# Patient Record
Sex: Female | Born: 1940 | Race: White | Hispanic: No | Smoking: Never smoker
Health system: Southern US, Community
[De-identification: ages and names within clinical notes are randomized; demographics above are authoritative.]

## PROBLEM LIST (undated history)

## (undated) DIAGNOSIS — E039 Hypothyroidism, unspecified: Secondary | ICD-10-CM

## (undated) DIAGNOSIS — G43009 Migraine without aura, not intractable, without status migrainosus: Secondary | ICD-10-CM

## (undated) DIAGNOSIS — H409 Unspecified glaucoma: Secondary | ICD-10-CM

## (undated) DIAGNOSIS — M1990 Unspecified osteoarthritis, unspecified site: Secondary | ICD-10-CM

## (undated) DIAGNOSIS — E785 Hyperlipidemia, unspecified: Secondary | ICD-10-CM

## (undated) DIAGNOSIS — T7840XA Allergy, unspecified, initial encounter: Secondary | ICD-10-CM

## (undated) DIAGNOSIS — K219 Gastro-esophageal reflux disease without esophagitis: Secondary | ICD-10-CM

## (undated) DIAGNOSIS — R42 Dizziness and giddiness: Secondary | ICD-10-CM

## (undated) DIAGNOSIS — C50919 Malignant neoplasm of unspecified site of unspecified female breast: Secondary | ICD-10-CM

## (undated) DIAGNOSIS — Z9889 Other specified postprocedural states: Secondary | ICD-10-CM

## (undated) DIAGNOSIS — N183 Chronic kidney disease, stage 3 unspecified: Secondary | ICD-10-CM

## (undated) DIAGNOSIS — N2581 Secondary hyperparathyroidism of renal origin: Secondary | ICD-10-CM

## (undated) DIAGNOSIS — M8580 Other specified disorders of bone density and structure, unspecified site: Secondary | ICD-10-CM

## (undated) DIAGNOSIS — R7309 Other abnormal glucose: Secondary | ICD-10-CM

## (undated) DIAGNOSIS — K5792 Diverticulitis of intestine, part unspecified, without perforation or abscess without bleeding: Secondary | ICD-10-CM

## (undated) DIAGNOSIS — K760 Fatty (change of) liver, not elsewhere classified: Secondary | ICD-10-CM

## (undated) DIAGNOSIS — K5732 Diverticulitis of large intestine without perforation or abscess without bleeding: Secondary | ICD-10-CM

## (undated) DIAGNOSIS — E8940 Asymptomatic postprocedural ovarian failure: Secondary | ICD-10-CM

## (undated) DIAGNOSIS — E7800 Pure hypercholesterolemia, unspecified: Secondary | ICD-10-CM

## (undated) DIAGNOSIS — J309 Allergic rhinitis, unspecified: Secondary | ICD-10-CM

## (undated) DIAGNOSIS — C801 Malignant (primary) neoplasm, unspecified: Secondary | ICD-10-CM

## (undated) DIAGNOSIS — R002 Palpitations: Secondary | ICD-10-CM

## (undated) DIAGNOSIS — O26611 Liver and biliary tract disorders in pregnancy, first trimester: Secondary | ICD-10-CM

## (undated) DIAGNOSIS — I10 Essential (primary) hypertension: Secondary | ICD-10-CM

## (undated) DIAGNOSIS — R112 Nausea with vomiting, unspecified: Secondary | ICD-10-CM

## (undated) DIAGNOSIS — N181 Chronic kidney disease, stage 1: Secondary | ICD-10-CM

## (undated) HISTORY — DX: Unspecified osteoarthritis, unspecified site: M19.90

## (undated) HISTORY — DX: Unspecified glaucoma: H40.9

## (undated) HISTORY — PX: CHOLECYSTECTOMY: SHX55

## (undated) HISTORY — DX: Pure hypercholesterolemia, unspecified: E78.00

## (undated) HISTORY — PX: TONSILLECTOMY: SUR1361

## (undated) HISTORY — PX: COLONOSCOPY: SHX174

## (undated) HISTORY — DX: Chronic kidney disease, stage 1: N18.1

## (undated) HISTORY — DX: Gastro-esophageal reflux disease without esophagitis: K21.9

## (undated) HISTORY — DX: Allergic rhinitis, unspecified: J30.9

## (undated) HISTORY — DX: Asymptomatic postprocedural ovarian failure: E89.40

## (undated) HISTORY — DX: Hyperlipidemia, unspecified: E78.5

## (undated) HISTORY — PX: BASAL CELL CARCINOMA EXCISION: SHX1214

## (undated) HISTORY — DX: Migraine without aura, not intractable, without status migrainosus: G43.009

## (undated) HISTORY — DX: Other specified disorders of bone density and structure, unspecified site: M85.80

## (undated) HISTORY — DX: Chronic kidney disease, stage 3 unspecified: N18.30

## (undated) HISTORY — DX: Malignant (primary) neoplasm, unspecified: C80.1

## (undated) HISTORY — DX: Hypothyroidism, unspecified: E03.9

## (undated) HISTORY — DX: Diverticulitis of large intestine without perforation or abscess without bleeding: K57.32

## (undated) HISTORY — PX: APPENDECTOMY: SHX54

## (undated) HISTORY — DX: Other abnormal glucose: R73.09

## (undated) HISTORY — DX: Allergy, unspecified, initial encounter: T78.40XA

## (undated) HISTORY — DX: Chronic kidney disease, stage 3 (moderate): N18.3

## (undated) HISTORY — PX: VAGINAL HYSTERECTOMY: SUR661

---

## 1999-11-17 ENCOUNTER — Other Ambulatory Visit: Admission: RE | Admit: 1999-11-17 | Discharge: 1999-11-17 | Payer: Self-pay | Admitting: Obstetrics and Gynecology

## 2004-01-11 DIAGNOSIS — C801 Malignant (primary) neoplasm, unspecified: Secondary | ICD-10-CM

## 2004-01-11 HISTORY — DX: Malignant (primary) neoplasm, unspecified: C80.1

## 2005-10-12 ENCOUNTER — Encounter: Admission: RE | Admit: 2005-10-12 | Discharge: 2005-10-12 | Payer: Self-pay | Admitting: Gastroenterology

## 2006-08-02 DIAGNOSIS — E7800 Pure hypercholesterolemia, unspecified: Secondary | ICD-10-CM | POA: Insufficient documentation

## 2006-08-02 DIAGNOSIS — G43009 Migraine without aura, not intractable, without status migrainosus: Secondary | ICD-10-CM | POA: Insufficient documentation

## 2006-08-02 DIAGNOSIS — E8940 Asymptomatic postprocedural ovarian failure: Secondary | ICD-10-CM | POA: Insufficient documentation

## 2006-12-04 DIAGNOSIS — E039 Hypothyroidism, unspecified: Secondary | ICD-10-CM | POA: Insufficient documentation

## 2008-04-29 LAB — HM COLONOSCOPY

## 2008-05-14 DIAGNOSIS — Z8719 Personal history of other diseases of the digestive system: Secondary | ICD-10-CM | POA: Insufficient documentation

## 2008-09-04 ENCOUNTER — Ambulatory Visit: Payer: Self-pay | Admitting: Unknown Physician Specialty

## 2009-02-16 ENCOUNTER — Ambulatory Visit: Payer: Self-pay | Admitting: Family Medicine

## 2009-08-24 DIAGNOSIS — N183 Chronic kidney disease, stage 3 unspecified: Secondary | ICD-10-CM | POA: Insufficient documentation

## 2009-08-24 DIAGNOSIS — R7309 Other abnormal glucose: Secondary | ICD-10-CM | POA: Insufficient documentation

## 2012-01-11 HISTORY — PX: BASAL CELL CARCINOMA EXCISION: SHX1214

## 2012-02-13 ENCOUNTER — Ambulatory Visit: Payer: Self-pay | Admitting: Family Medicine

## 2012-02-20 ENCOUNTER — Ambulatory Visit: Payer: Self-pay | Admitting: Family Medicine

## 2012-03-01 ENCOUNTER — Encounter: Payer: Self-pay | Admitting: Cardiovascular Disease

## 2012-03-01 ENCOUNTER — Ambulatory Visit (INDEPENDENT_AMBULATORY_CARE_PROVIDER_SITE_OTHER): Payer: Medicare Other | Admitting: Cardiovascular Disease

## 2012-03-01 VITALS — BP 136/80 | HR 81 | Ht 65.0 in | Wt 153.2 lb

## 2012-03-01 DIAGNOSIS — R112 Nausea with vomiting, unspecified: Secondary | ICD-10-CM

## 2012-03-01 DIAGNOSIS — R000 Tachycardia, unspecified: Secondary | ICD-10-CM | POA: Insufficient documentation

## 2012-03-01 DIAGNOSIS — R0602 Shortness of breath: Secondary | ICD-10-CM | POA: Insufficient documentation

## 2012-03-01 NOTE — Patient Instructions (Addendum)
You are doing well. No medication changes were made.  Please call us if you have new issues that need to be addressed before your next appt.    

## 2012-03-01 NOTE — Assessment & Plan Note (Signed)
She reports that her breathing has improved since her episodes of illness. She is otherwise active. She does not want further testing at this time.

## 2012-03-01 NOTE — Assessment & Plan Note (Signed)
Acute onset of gastroenteritis 10 days ago. She has been weak since then, slowly improving.

## 2012-03-01 NOTE — Progress Notes (Signed)
Patient ID: Theresa Hall, female    DOB: [birth year], 72 y.o.   MRN: [id number]  HPI Comments: Theresa Hall is a very pleasant 72 year old woman, patient of Dr. Charlette Caffey, no significant prior cardiac history who presents for evaluation of recent tachycardia episodes..  She reports that approximately 10 days ago, she had severe gastroenteritis. She had 8-9 episodes of vomiting and diarrhea lasting from 4 in the morning until 9 in the morning. She felt very weak and dehydrated following her illness. This occurred on a Tuesday. She's not eating much for the rest of the week, only small bites and little bits of fluid. That Sunday, she went to church and had acute onset of palpitations and severe tachycardia lasting for several minutes. Her legs felt like "rubber bands" and she had difficulty getting home, only with the assistance of a friend. Friend reported that she was pale and diaphoretic. Blood pressure at home was very high. Tachycardia resolved on its own and she resumed her fluid intake and trying to eat better. She saw Dr. Charlette Caffey the next day and lab work was obtained.  That showed BNP 18, creatinine 1.14, potassium 4.4, normal LFTs, sodium 143, CK 129, d-dimer 0.2 EKG was essentially normal with heart rate of 70  She has felt weak since then but slowly improving EKG today shows normal sinus rhythm with rate 70 beats per minute with no significant ST or T wave changes   Outpatient Encounter Prescriptions as of 03/01/2012  Medication Sig Dispense Refill  . aspirin 81 MG tablet Take 162 mg by mouth daily.       . Calcium Carbonate-Vitamin D (CALCIUM + D PO) Take by mouth 2 (two) times daily.      . Calcium Polycarbophil (FIBERCON PO) Take by mouth daily.      . ergocalciferol (VITAMIN D2) 50000 UNITS capsule Take 50,000 Units by mouth once a week.      . estradiol (VIVELLE-DOT) 0.0375 MG/24HR Place 1 patch onto the skin 2 (two) times a week.      . fluticasone (FLONASE) 50 MCG/ACT nasal  spray Place 2 sprays into the nose daily.       Marland Kitchen levothyroxine (SYNTHROID, LEVOTHROID) 88 MCG tablet Take 88 mcg by mouth daily.      . Multiple Vitamin (MULTIVITAMIN) tablet Take 1 tablet by mouth daily.      . Omega-3 Fatty Acids (FISH OIL) 1000 MG CAPS Take 2,000 mg by mouth daily.      Marland Kitchen omeprazole (PRILOSEC) 40 MG capsule Take 40 mg by mouth daily.      . pravastatin (PRAVACHOL) 80 MG tablet Take 80 mg by mouth daily.      . SUMAtriptan (IMITREX STATDOSE REFILL) 6 MG/0.5ML SOLN injection Inject 6 mg into the skin once as needed for migraine or headache. F      . topiramate (TOPAMAX) 50 MG tablet Take 50 mg by mouth daily.       . travoprost, benzalkonium, (TRAVATAN) 0.004 % ophthalmic solution 1 drop at bedtime.        Review of Systems  Constitutional: Negative.   HENT: Negative.   Eyes: Negative.   Respiratory: Negative.   Cardiovascular: Positive for palpitations.       Tachycardia  Gastrointestinal: Negative.   Musculoskeletal: Negative.   Skin: Negative.   Neurological: Negative.   Psychiatric/Behavioral: Negative.   All other systems reviewed and are negative.    BP 136/80  Pulse 81  Ht 5\' 5"  (1.651 m)  Wt 153 lb 4 oz (69.514 kg)  BMI 25.5 kg/m2  Physical Exam  Nursing note and vitals reviewed. Constitutional: She is oriented to person, place, and time. She appears well-developed and well-nourished.  HENT:  Head: Normocephalic.  Nose: Nose normal.  Mouth/Throat: Oropharynx is clear and moist.  Eyes: Conjunctivae are normal. Pupils are equal, round, and reactive to light.  Neck: Normal range of motion. Neck supple. No JVD present.  Cardiovascular: Normal rate, regular rhythm, S1 normal, S2 normal, normal heart sounds and intact distal pulses.  Exam reveals no gallop and no friction rub.   No murmur heard. Pulmonary/Chest: Effort normal and breath sounds normal. No respiratory distress. She has no wheezes. She has no rales. She exhibits no tenderness.   Abdominal: Soft. Bowel sounds are normal. She exhibits no distension. There is no tenderness.  Musculoskeletal: Normal range of motion. She exhibits no edema and no tenderness.  Lymphadenopathy:    She has no cervical adenopathy.  Neurological: She is alert and oriented to person, place, and time. Coordination normal.  Skin: Skin is warm and dry. No rash noted. No erythema.  Psychiatric: She has a normal mood and affect. Her behavior is normal. Judgment and thought content normal.    Assessment and Plan

## 2012-03-01 NOTE — Assessment & Plan Note (Signed)
Type of arrhythmia while she was at church is uncertain, unable to exclude atrial fibrillation or SVT, even atrial tachycardia. Resolved on its own. My suspicion is it could be connected to her recent gastroenteritis episode. No further episodes since that time and no prior episodes. Otherwise doing well today. We have suggested he closely monitor her and if she has additional episodes, further testing could be done such as Holter monitors, poor event monitors, echocardiogram. Clinical exam is otherwise essentially benign, EKG last week and today is essentially normal.

## 2012-05-09 ENCOUNTER — Ambulatory Visit: Payer: Self-pay | Admitting: Nurse Practitioner

## 2012-05-24 ENCOUNTER — Encounter: Payer: Self-pay | Admitting: *Deleted

## 2012-05-28 ENCOUNTER — Ambulatory Visit (INDEPENDENT_AMBULATORY_CARE_PROVIDER_SITE_OTHER): Payer: Medicare Other | Admitting: Nurse Practitioner

## 2012-05-28 ENCOUNTER — Encounter: Payer: Self-pay | Admitting: Nurse Practitioner

## 2012-05-28 VITALS — BP 140/72 | HR 78 | Ht 64.5 in | Wt 164.0 lb

## 2012-05-28 DIAGNOSIS — Z01419 Encounter for gynecological examination (general) (routine) without abnormal findings: Secondary | ICD-10-CM

## 2012-05-28 MED ORDER — ESTRADIOL 0.0375 MG/24HR TD PTTW: 1.0000 | MEDICATED_PATCH | TRANSDERMAL | Status: DC

## 2012-05-28 NOTE — Patient Instructions (Signed)

## 2012-05-28 NOTE — Progress Notes (Signed)
72 y.o. G2, P2. widowed Caucasian Fe here for annual exam.  Recent cardiology visit in February for palpitation. Cardiologist found no problems and related this to a recent viral illness with electrolyte imbalance.  Over the past several months there has been a slight elevation in serum creatine. She is now off all NSAID. Now some flare of generalized joint pain but tolerable.  She continues on Vivelle dot but realizes there may some insurance issues with coverage.  Patient's last menstrual period was 01/11/1987.          Sexually active: no  The current method of family planning is status post hysterectomy.    Exercising: no  The patient does not participate in regular exercise at present. Smoker:  no  Health Maintenance: Pap:  12/01/2000  normal MMG:  05/2012 normal Colonoscopy:  08/2008 normal BMD:   02/2012 T Score: spine 0.5; left femur neck -1.8    (PCP manages) TDaP:  Up to date, per pt.   Labs: PCP does lab (blood) work.    reports that she has never smoked. She has never used smokeless tobacco. She reports that she does not drink alcohol or use illicit drugs.  Past Medical History  Diagnosis Date  . Other and unspecified hyperlipidemia   . Unspecified glaucoma(365.9)   . Osteoarthritis   . Postablative ovarian failure   . Pure hypercholesterolemia   . Migraine without aura, without mention of intractable migraine without mention of status migrainosus   . Unspecified hypothyroidism   . Allergic rhinitis, cause unspecified   . Diverticulitis of colon (without mention of hemorrhage)   . Chronic kidney disease, stage I   . Other abnormal glucose   . Chronic kidney disease, stage III (moderate)   . Cancer 10/2011    basal 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    Past Surgical History  Procedure Laterality Date  . Basal cell carcinoma excision    . Colonoscopy    . Tonsillectomy    . Appendectomy    . Vaginal hysterectomy    . Basal cell carcinoma excision  2014    nasal    Current  Outpatient Prescriptions  Medication Sig Dispense Refill  . aspirin 81 MG tablet Take 162 mg by mouth daily.       . Calcium Carbonate-Vitamin D (CALCIUM + D PO) Take by mouth 2 (two) times daily.      . Calcium Polycarbophil (FIBERCON PO) Take by mouth daily.      . ergocalciferol (VITAMIN D2) 50000 UNITS capsule Take 50,000 Units by mouth once a week.      . estradiol (VIVELLE-DOT) 0.0375 MG/24HR Place 1 patch onto the skin 2 (two) times a week.  24 patch  3  . fluticasone (FLONASE) 50 MCG/ACT nasal spray Place 2 sprays into the nose daily.       Marland Kitchen ipratropium (ATROVENT) 0.02 % nebulizer solution Take 500 mcg by nebulization 4 (four) times daily.      Marland Kitchen levothyroxine (SYNTHROID, LEVOTHROID) 88 MCG tablet Take 88 mcg by mouth daily.      . Multiple Vitamin (MULTIVITAMIN) tablet Take 1 tablet by mouth daily.      . Omega-3 Fatty Acids (FISH OIL) 1000 MG CAPS Take 2,000 mg by mouth daily.      Marland Kitchen omeprazole (PRILOSEC) 40 MG capsule Take 40 mg by mouth daily.      . pravastatin (PRAVACHOL) 80 MG tablet Take 80 mg by mouth daily.      . SUMAtriptan (IMITREX STATDOSE  REFILL) 6 MG/0.5ML SOLN injection Inject 6 mg into the skin once as needed for migraine or headache. F      . topiramate (TOPAMAX) 50 MG tablet Take 50 mg by mouth daily.       . travoprost, benzalkonium, (TRAVATAN) 0.004 % ophthalmic solution 1 drop at bedtime.      . mometasone (NASONEX) 50 MCG/ACT nasal spray Place 2 sprays into the nose daily.       No current facility-administered medications for this visit.    Family History  Problem Relation Age of Onset  . Heart attack Father     x2  . Heart disease Sister   . Heart attack Sister   . Heart attack Brother   . Heart failure Brother     ROS:  Pertinent items are noted in HPI.  Otherwise, a comprehensive ROS was negative.  Exam:   BP 140/72  Pulse 78  Ht 5' 4.5" (1.638 m)  Wt 164 lb (74.39 kg)  BMI 27.73 kg/m2  LMP 01/11/1987 Height: 5' 4.5" (163.8 cm)  Ht  Readings from Last 3 Encounters:  05/28/12 5' 4.5" (1.638 m)  03/01/12 5\' 5"  (1.651 m)    General appearance: alert, cooperative and appears stated age Head: Normocephalic, without obvious abnormality, atraumatic Neck: no adenopathy, supple, symmetrical, trachea midline and thyroid normal to inspection and palpation Lungs: clear to auscultation bilaterally Breasts: normal appearance, no masses or tenderness Heart: regular rate and rhythm Abdomen: soft, non-tender; no masses,  no organomegaly Extremities: extremities normal, atraumatic, no cyanosis or edema Skin: Skin color, texture, turgor normal. No rashes or lesions Lymph nodes: Cervical, supraclavicular, and axillary nodes normal. No abnormal inguinal nodes palpated Neurologic: Grossly normal   Pelvic: External genitalia:  no lesions              Urethra:  normal appearing urethra with no masses, tenderness or lesions              Bartholin's and Skene's: normal                 Vagina: normal appearing vagina with normal color and discharge, no lesions              Cervix: absent              Pap taken: no Bimanual Exam:  Uterus:  uterus absent              Adnexa: no mass, fullness, tenderness               Rectovaginal: Confirms               Anus:  normal sphincter tone, no lesions  A:  Well Woman with normal exam  S/P TAH / BSO secondary to uterine fibroids 10/1987 on ERT    P:   Pap smear as per guidelines   Mammogram due 5/15  Refill Vivelle dot 0.375 mg (she has been reluctant to go lower in dosage in the past, maybe can reduce  dosage to get insurance coverage.  Discussed the positive and adverse effects of ERT including DVT, CVA, caner, etc. Pt wishes to continue  return annually or prn  An After Visit Summary was printed and given to the patient.

## 2012-05-29 ENCOUNTER — Telehealth: Payer: Self-pay | Admitting: Nurse Practitioner

## 2012-05-29 NOTE — Telephone Encounter (Signed)
Pt wants to talk with PG concerning the prescription she sent to [facility]

## 2012-05-29 NOTE — Telephone Encounter (Signed)
Spoke with patient re: vivelle dot. Pharmacy had already called her when she got home yesterday to let her know that her insurance will no longer be covering this. She states that her and PG discussed this and had other options. Is ok with whatever PG wants. Did say not able to pay $70 monthly if we do the prior auth. Please advise of other options.

## 2012-05-29 NOTE — Telephone Encounter (Signed)
Lets send in order for Vivelle dot 0.025 mg and see if less expensive for her and maybe a generic option.  Still may need for [place] to send prior authorization.

## 2012-05-30 NOTE — Progress Notes (Signed)
Encounter reviewed by Dr. Brook Silva.  

## 2013-04-24 ENCOUNTER — Ambulatory Visit: Payer: Self-pay | Admitting: Family Medicine

## 2013-11-11 ENCOUNTER — Encounter: Payer: Self-pay | Admitting: Nurse Practitioner

## 2013-12-23 LAB — LIPID PANEL
Cholesterol: 179 mg/dL (ref 0–200)
HDL: 82 mg/dL — AB (ref 35–70)
LDL Cholesterol: 79 mg/dL
TRIGLYCERIDES: 89 mg/dL (ref 40–160)

## 2014-06-23 ENCOUNTER — Encounter: Payer: Self-pay | Admitting: Family Medicine

## 2014-06-23 ENCOUNTER — Other Ambulatory Visit: Payer: Self-pay | Admitting: Emergency Medicine

## 2014-06-23 ENCOUNTER — Encounter (INDEPENDENT_AMBULATORY_CARE_PROVIDER_SITE_OTHER): Payer: Self-pay

## 2014-06-23 ENCOUNTER — Ambulatory Visit (INDEPENDENT_AMBULATORY_CARE_PROVIDER_SITE_OTHER): Payer: Commercial Managed Care - HMO | Admitting: Family Medicine

## 2014-06-23 ENCOUNTER — Telehealth: Payer: Self-pay | Admitting: Family Medicine

## 2014-06-23 VITALS — BP 122/76 | HR 91 | Temp 98.5°F | Resp 16 | Ht 65.0 in | Wt 154.3 lb

## 2014-06-23 DIAGNOSIS — N183 Chronic kidney disease, stage 3 unspecified: Secondary | ICD-10-CM

## 2014-06-23 DIAGNOSIS — E7800 Pure hypercholesterolemia, unspecified: Secondary | ICD-10-CM

## 2014-06-23 DIAGNOSIS — R7309 Other abnormal glucose: Secondary | ICD-10-CM

## 2014-06-23 DIAGNOSIS — E785 Hyperlipidemia, unspecified: Secondary | ICD-10-CM | POA: Insufficient documentation

## 2014-06-23 DIAGNOSIS — E039 Hypothyroidism, unspecified: Secondary | ICD-10-CM | POA: Diagnosis not present

## 2014-06-23 DIAGNOSIS — G43001 Migraine without aura, not intractable, with status migrainosus: Secondary | ICD-10-CM

## 2014-06-23 DIAGNOSIS — N189 Chronic kidney disease, unspecified: Secondary | ICD-10-CM | POA: Insufficient documentation

## 2014-06-23 DIAGNOSIS — R002 Palpitations: Secondary | ICD-10-CM | POA: Insufficient documentation

## 2014-06-23 DIAGNOSIS — K2100 Gastro-esophageal reflux disease with esophagitis, without bleeding: Secondary | ICD-10-CM | POA: Insufficient documentation

## 2014-06-23 MED ORDER — ONETOUCH ULTRA BLUE VI STRP: ORAL_STRIP | Status: DC

## 2014-06-23 MED ORDER — GLUCOSE BLOOD VI STRP: ORAL_STRIP | Status: DC

## 2014-06-23 NOTE — Telephone Encounter (Signed)
The pharmacy received the order for the test strips but it had no name on it. Frady is requesting that One Touch Ultra Test Strips be called in to [facility]. Thank you

## 2014-06-23 NOTE — Addendum Note (Signed)
Addended by: Ashok Norris on: 06/23/2014 10:31 AM   Modules accepted: Miquel Dunn

## 2014-06-23 NOTE — Patient Instructions (Signed)
Fu 6 mo

## 2014-06-23 NOTE — Progress Notes (Signed)
Name: Theresa Hall   MRN: [id number]    DOB: [birth year]   Date:06/23/2014       Progress Note  Subjective  Chief Complaint  Chief Complaint  Patient presents with  . Hypothyroidism  . Hyperlipidemia    Hyperlipidemia This is a chronic problem. The current episode started more than 1 year ago. The problem is controlled. Recent lipid tests were reviewed and are normal. There are no known factors aggravating her hyperlipidemia. Pertinent negatives include no chest pain, focal weakness, myalgias or shortness of breath. The current treatment provides moderate improvement of lipids. There are no compliance problems.  Risk factors for coronary artery disease include dyslipidemia and a sedentary lifestyle.  Thyroid Problem Presents for follow-up visit. Symptoms include anxiety. Patient reports no constipation, diarrhea, palpitations, tremors or weight loss. The symptoms have been stable. Past treatments include levothyroxine. The treatment provided moderate relief. Her past medical history is significant for hyperlipidemia. There is no history of atrial fibrillation or Graves' ophthalmopathy. There are no known risk factors.  Migraine  This is a chronic problem. The current episode started more than 1 year ago. The problem occurs intermittently. The problem has been resolved. The pain quality is similar to prior headaches. The quality of the pain is described as throbbing. Pertinent negatives include no back pain, blurred vision, coughing, dizziness, eye redness, fever, hearing loss, insomnia, nausea, neck pain, seizures, sore throat, tingling, tinnitus, vomiting, weakness or weight loss.   CKD  Followed by nephrologist and is stable  STABLE AND IS FOLLOWED BY NEPHROLOGIST     Past Medical History  Diagnosis Date  . Other and unspecified hyperlipidemia   . Unspecified glaucoma   . Osteoarthritis   . Postablative ovarian failure   . Pure hypercholesterolemia   . Migraine without aura,  without mention of intractable migraine without mention of status migrainosus   . Unspecified hypothyroidism   . Allergic rhinitis, cause unspecified   . Diverticulitis of colon (without mention of hemorrhage)   . Chronic kidney disease, stage I   . Other abnormal glucose   . Chronic kidney disease, stage III (moderate)   . Cancer 10/2011    basal 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    History  Substance Use Topics  . Smoking status: Never Smoker   . Smokeless tobacco: Never Used  . Alcohol Use: No     Current outpatient prescriptions:  .  Calcium Carbonate-Vitamin D (CALCIUM + D PO), Take by mouth 2 (two) times daily., Disp: , Rfl:  .  Calcium Polycarbophil (FIBERCON PO), Take by mouth daily., Disp: , Rfl:  .  Fish Oil-Cholecalciferol (FISH OIL + D3) 1200-1000 MG-UNIT CAPS, Take by mouth., Disp: , Rfl:  .  latanoprost (XALATAN) 0.005 % ophthalmic solution, PLACE 1 DROP INTO BOTH EYES AT BEDTIME, Disp: , Rfl: 5 .  levothyroxine (SYNTHROID, LEVOTHROID) 88 MCG tablet, Take 88 mcg by mouth daily before breakfast., Disp: , Rfl:  .  mometasone (NASONEX) 50 MCG/ACT nasal spray, Place into the nose., Disp: , Rfl:  .  Multiple Vitamin (MULTIVITAMIN) tablet, Take 1 tablet by mouth daily., Disp: , Rfl:  .  Omega-3 Fatty Acids (FISH OIL) 1000 MG CAPS, Take 2,000 mg by mouth daily., Disp: , Rfl:  .  omeprazole (PRILOSEC) 40 MG capsule, Take 40 mg by mouth daily., Disp: , Rfl:  .  pravastatin (PRAVACHOL) 80 MG tablet, Take 80 mg by mouth daily., Disp: , Rfl:  .  topiramate (TOPAMAX) 50 MG tablet, Take 50 mg by  mouth daily. , Disp: , Rfl:  .  Travoprost, BAK Free, (TRAVATAN Z) 0.004 % SOLN ophthalmic solution, Apply to eye., Disp: , Rfl:   Allergies  Allergen Reactions  . Codeine Other (See Comments)    Patient had insomnia and nausea    Review of Systems  Constitutional: Negative for fever, chills and weight loss.  HENT: Negative for congestion, hearing loss, sore throat and tinnitus.   Eyes:  Negative for blurred vision, double vision and redness.  Respiratory: Negative for cough, hemoptysis and shortness of breath.   Cardiovascular: Negative for chest pain, palpitations, orthopnea, claudication and leg swelling.  Gastrointestinal: Negative for heartburn, nausea, vomiting, diarrhea, constipation and blood in stool.  Genitourinary: Negative for dysuria, urgency, frequency and hematuria.  Musculoskeletal: Negative for myalgias, back pain, joint pain, falls and neck pain.  Skin: Negative for itching.  Neurological: Negative for dizziness, tingling, tremors, focal weakness, seizures, loss of consciousness, weakness and headaches.  Endo/Heme/Allergies: Does not bruise/bleed easily.       Hypotx  Psychiatric/Behavioral: Negative for depression and substance abuse. The patient is nervous/anxious. The patient does not have insomnia.      Objective  Filed Vitals:   06/23/14 0934  BP: 122/76  Pulse: 91  Temp: 98.5 F (36.9 C)  TempSrc: Oral  Resp: 16  Height: 5' 5"  (1.651 m)  Weight: 154 lb 4.8 oz (69.99 kg)  SpO2: 97%     Physical Exam  Constitutional: She is oriented to person, place, and time and well-developed, well-nourished, and in no distress.  HENT:  Head: Normocephalic.  Eyes: EOM are normal. Pupils are equal, round, and reactive to light.  Neck: Normal range of motion. No thyromegaly present.  Cardiovascular: Normal rate, regular rhythm and normal heart sounds.   No murmur heard. Pulmonary/Chest: Effort normal and breath sounds normal.  Abdominal: Soft. Bowel sounds are normal.  Musculoskeletal: Normal range of motion. She exhibits no edema.  Neurological: She is alert and oriented to person, place, and time. No cranial nerve deficit. Gait normal.  Skin: Skin is warm and dry. No rash noted.  Psychiatric: Memory and affect normal.      Assessment & Plan  1. Migraine without aura and with status migrainosus, not intractable stable   2.  Hypercholesterolemia without hypertriglyceridemia stable  - Lipid Profile  3. Chronic kidney disease, stage 3 (moderate stable - Comp Met (CMET)  4. Hypothyroidism, unspecified hypothyroidism type stable - TSH

## 2014-06-28 ENCOUNTER — Other Ambulatory Visit: Payer: Self-pay | Admitting: Family Medicine

## 2014-07-02 NOTE — Telephone Encounter (Signed)
Do you know if this has been taken care of?

## 2014-07-05 LAB — COMPREHENSIVE METABOLIC PANEL
ALT: 25 IU/L (ref 0–32)
AST: 23 IU/L (ref 0–40)
Albumin/Globulin Ratio: 1.9 (ref 1.1–2.5)
Albumin: 4.6 g/dL (ref 3.5–4.8)
Alkaline Phosphatase: 109 IU/L (ref 39–117)
BILIRUBIN TOTAL: 0.4 mg/dL (ref 0.0–1.2)
BUN / CREAT RATIO: 18 (ref 11–26)
BUN: 20 mg/dL (ref 8–27)
CALCIUM: 10.2 mg/dL (ref 8.7–10.3)
CO2: 23 mmol/L (ref 18–29)
Chloride: 104 mmol/L (ref 97–108)
Creatinine, Ser: 1.1 mg/dL — ABNORMAL HIGH (ref 0.57–1.00)
GFR calc Af Amer: 57 mL/min/{1.73_m2} — ABNORMAL LOW (ref >59)
GFR calc non Af Amer: 49 mL/min/{1.73_m2} — ABNORMAL LOW (ref >59)
GLOBULIN, TOTAL: 2.4 g/dL (ref 1.5–4.5)
Glucose: 99 mg/dL (ref 65–99)
Potassium: 4.6 mmol/L (ref 3.5–5.2)
SODIUM: 142 mmol/L (ref 134–144)
TOTAL PROTEIN: 7 g/dL (ref 6.0–8.5)

## 2014-07-05 LAB — TSH: TSH: 0.299 u[IU]/mL — ABNORMAL LOW (ref 0.450–4.500)

## 2014-07-05 LAB — LIPID PANEL
Chol/HDL Ratio: 2.4 ratio (ref 0.0–4.4)
Cholesterol, Total: 154 mg/dL (ref 100–199)
HDL: 63 mg/dL
LDL Calculated: 69 mg/dL (ref 0–99)
Triglycerides: 109 mg/dL (ref 0–149)
VLDL Cholesterol Cal: 22 mg/dL (ref 5–40)

## 2014-07-10 NOTE — Progress Notes (Signed)
Patient notified

## 2014-09-16 ENCOUNTER — Other Ambulatory Visit: Payer: Self-pay | Admitting: Family Medicine

## 2014-10-06 ENCOUNTER — Telehealth: Payer: Self-pay

## 2014-10-06 NOTE — Telephone Encounter (Signed)
Patient called because she needs a [employer] referral for today for [facility] to be seen by Dr. Wallace Going.  Auth # [id number] Start- 10/06/2014 Expires-  04/04/2015 Dr. Leandrew Koyanagi  ICD-10: Z01.00

## 2014-11-13 ENCOUNTER — Telehealth: Payer: Self-pay | Admitting: Family Medicine

## 2014-11-13 NOTE — Telephone Encounter (Signed)
Due to patient insurance she is needing a referral to Dr. Juleen China ([place] she has appointment with him on 11-28-14 and have to go next week to get lab drawn. Please call patient once this is completed.

## 2014-11-14 NOTE — Telephone Encounter (Signed)
Referral obtained Auth # [id number] Start - 11/14/14 Expires - 05/13/15 Dr. Juleen China ICD-10: N18.3

## 2014-11-14 NOTE — Telephone Encounter (Signed)
Can you please update in [employer] portal

## 2014-12-12 ENCOUNTER — Ambulatory Visit (INDEPENDENT_AMBULATORY_CARE_PROVIDER_SITE_OTHER): Payer: Commercial Managed Care - HMO

## 2014-12-12 DIAGNOSIS — Z23 Encounter for immunization: Secondary | ICD-10-CM | POA: Diagnosis not present

## 2014-12-17 ENCOUNTER — Other Ambulatory Visit: Payer: Self-pay | Admitting: Family Medicine

## 2014-12-18 ENCOUNTER — Other Ambulatory Visit: Payer: Self-pay | Admitting: Family Medicine

## 2015-01-06 ENCOUNTER — Encounter: Payer: Self-pay | Admitting: Family Medicine

## 2015-01-06 ENCOUNTER — Ambulatory Visit (INDEPENDENT_AMBULATORY_CARE_PROVIDER_SITE_OTHER): Payer: Commercial Managed Care - HMO | Admitting: Family Medicine

## 2015-01-06 VITALS — BP 126/64 | HR 72 | Temp 98.2°F | Resp 18 | Ht 65.0 in | Wt 160.2 lb

## 2015-01-06 DIAGNOSIS — E785 Hyperlipidemia, unspecified: Secondary | ICD-10-CM

## 2015-01-06 DIAGNOSIS — N183 Chronic kidney disease, stage 3 unspecified: Secondary | ICD-10-CM

## 2015-01-06 DIAGNOSIS — G43009 Migraine without aura, not intractable, without status migrainosus: Secondary | ICD-10-CM

## 2015-01-06 DIAGNOSIS — E039 Hypothyroidism, unspecified: Secondary | ICD-10-CM

## 2015-01-06 MED ORDER — LEVOTHYROXINE SODIUM 88 MCG PO TABS: 88.0000 ug | ORAL_TABLET | Freq: Every day | ORAL | Status: DC

## 2015-01-06 NOTE — Progress Notes (Signed)
Name: Theresa Hall   MRN: [id number]    DOB: 1940-07-04   Date:01/06/2015       Progress Note  Subjective  Chief Complaint  Chief Complaint  Patient presents with  . Thyroid Problem    pt here for 53month follow up  . Hyperlipidemia  . Chronic Kidney Disease    HPI   Hypothyroidism  Patient presents for follow-up of hypothyroidism. It has been present for over 5 years.  Current symptoms consist of levothyroxin . Current medication regimen consist of levothyroxin 88 micrograms daily .   There is good compliance with regimen.    Hyperlipidemia  Patient has a history of hyperlipidemia for over 5 years.  Current medical regimen consist of omega-3 fatty acids 1000 mg twice a day and pravastatin 80 mg daily at bedtime .  Compliance is good .  Diet and exercise are currently followed well .  Risk factors for cardiovascular disease include hyperlipidemia and age .   There have been no side effects from the medication.    Chronic kidney disease  Patient is being followed by nephrologist for her chronic kidney disease. B secondary to age and prior incident. It has been gradually improving and on 07/04/2014 was 73  Past Medical History  Diagnosis Date  . Other and unspecified hyperlipidemia   . Unspecified glaucoma   . Osteoarthritis   . Postablative ovarian failure   . Pure hypercholesterolemia   . Migraine without aura, without mention of intractable migraine without mention of status migrainosus   . Unspecified hypothyroidism   . Allergic rhinitis, cause unspecified   . Diverticulitis of colon (without mention of hemorrhage)   . Chronic kidney disease, stage I   . Other abnormal glucose   . Chronic kidney disease, stage III (moderate)   . Cancer ([place]) 10/2011    basal 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    Social History  Substance Use Topics  . Smoking status: Never Smoker   . Smokeless tobacco: Never Used  . Alcohol Use: No     Current outpatient prescriptions:  .  ASPIRIN LOW  DOSE 81 MG chewable tablet, , Disp: , Rfl:  .  Calcium Carbonate-Vitamin D (CALCIUM + D PO), Take by mouth 2 (two) times daily., Disp: , Rfl:  .  Calcium Polycarbophil (FIBERCON PO), Take by mouth daily., Disp: , Rfl:  .  Fish Oil-Cholecalciferol (FISH OIL + D3) 1200-1000 MG-UNIT CAPS, Take by mouth., Disp: , Rfl:  .  latanoprost (XALATAN) 0.005 % ophthalmic solution, PLACE 1 DROP INTO BOTH EYES AT BEDTIME, Disp: , Rfl: 5 .  Multiple Vitamin (MULTIVITAMIN) tablet, Take 1 tablet by mouth daily., Disp: , Rfl:  .  NASONEX 50 MCG/ACT nasal spray, INHALE 2 PUFFS DAILY, Disp: 17 g, Rfl: 3 .  Omega-3 Fatty Acids (FISH OIL) 1000 MG CAPS, Take 2,000 mg by mouth daily., Disp: , Rfl:  .  omeprazole (PRILOSEC) 40 MG capsule, TAKE 1 CAPSULE EVERY DAY, Disp: 90 capsule, Rfl: 3 .  ONE TOUCH ULTRA TEST test strip, USE ONE STRIP TO CHECK GLUCOSE ONCE DAILY, Disp: 90 each, Rfl: 3 .  pravastatin (PRAVACHOL) 80 MG tablet, TAKE 1 TABLET EVERY DAY, Disp: 90 tablet, Rfl: 3 .  SYNTHROID 88 MCG tablet, TAKE 1 TABLET EVERY DAY, Disp: 90 tablet, Rfl: 1 .  topiramate (TOPAMAX) 50 MG tablet, Take 50 mg by mouth daily. , Disp: , Rfl:  .  Travoprost, BAK Free, (TRAVATAN Z) 0.004 % SOLN ophthalmic solution, Apply to eye., Disp: ,  Rfl:   Allergies  Allergen Reactions  . Codeine Other (See Comments)    Patient had insomnia and nausea    Review of Systems  Constitutional: Negative for fever, chills and weight loss.  HENT: Negative for congestion, hearing loss, sore throat and tinnitus.   Eyes: Negative for blurred vision, double vision and redness.  Respiratory: Negative for cough, hemoptysis and shortness of breath.   Cardiovascular: Negative for chest pain, palpitations, orthopnea, claudication and leg swelling.  Gastrointestinal: Negative for heartburn, nausea, vomiting, diarrhea, constipation and blood in stool.  Genitourinary: Negative for dysuria, urgency, frequency and hematuria.  Musculoskeletal: Negative for  myalgias, back pain, joint pain, falls and neck pain.  Skin: Negative for itching.  Neurological: Negative for dizziness, tingling, tremors, focal weakness, seizures, loss of consciousness, weakness and headaches.  Endo/Heme/Allergies: Does not bruise/bleed easily.  Psychiatric/Behavioral: Negative for depression and substance abuse. The patient is not nervous/anxious and does not have insomnia.      Objective  Filed Vitals:   01/06/15 1026  BP: 126/64  Pulse: 72  Temp: 98.2 F (36.8 C)  Resp: 18  Height: 5\' 5"  (1.651 m)  Weight: 160 lb 4 oz (72.689 kg)  SpO2: 98%     Physical Exam  Constitutional: She is oriented to person, place, and time and well-developed, well-nourished, and in no distress.  HENT:  Head: Normocephalic.  Eyes: EOM are normal. Pupils are equal, round, and reactive to light.  Neck: Normal range of motion. No thyromegaly present.  Cardiovascular: Normal rate, regular rhythm and normal heart sounds.   No murmur heard. Pulmonary/Chest: Effort normal and breath sounds normal.  Abdominal: Soft. Bowel sounds are normal.  Musculoskeletal: Normal range of motion. She exhibits no edema.  Neurological: She is alert and oriented to person, place, and time. No cranial nerve deficit. Gait normal.  No tremor noted  Skin: Skin is warm and dry. No rash noted.  Psychiatric: Memory and affect normal.  Loquacious in no acute distress      Assessment & Plan   1. Hypothyroidism, unspecified hypothyroidism type Labs - Lipid panel - TSH  2. Chronic kidney disease, stage 3 (moderate) Improved. Continue follow-up with nephrologist and avoid NSAIDs - Comprehensive Metabolic Panel (CMET)  3. Migraine without aura and without status migrainosus, not intractable Much improved using Tylenol only) on rare occasion  4. HLD (hyperlipidemia) Labs - Lipid panel

## 2015-01-09 LAB — COMPREHENSIVE METABOLIC PANEL
ALBUMIN: 4.5 g/dL (ref 3.5–4.8)
ALT: 31 IU/L (ref 0–32)
AST: 29 IU/L (ref 0–40)
Albumin/Globulin Ratio: 2 (ref 1.1–2.5)
Alkaline Phosphatase: 119 IU/L — ABNORMAL HIGH (ref 39–117)
BUN/Creatinine Ratio: 16 (ref 11–26)
BUN: 16 mg/dL (ref 8–27)
Bilirubin Total: 0.4 mg/dL (ref 0.0–1.2)
CALCIUM: 9.6 mg/dL (ref 8.7–10.3)
CO2: 20 mmol/L (ref 18–29)
CREATININE: 1 mg/dL (ref 0.57–1.00)
Chloride: 106 mmol/L (ref 96–106)
GFR calc Af Amer: 63 mL/min/{1.73_m2} (ref >59)
GFR, EST NON AFRICAN AMERICAN: 55 mL/min/{1.73_m2} — AB (ref >59)
GLOBULIN, TOTAL: 2.3 g/dL (ref 1.5–4.5)
GLUCOSE: 105 mg/dL — AB (ref 65–99)
Potassium: 4.7 mmol/L (ref 3.5–5.2)
SODIUM: 144 mmol/L (ref 134–144)
Total Protein: 6.8 g/dL (ref 6.0–8.5)

## 2015-01-09 LAB — LIPID PANEL
CHOL/HDL RATIO: 2.6 ratio (ref 0.0–4.4)
Cholesterol, Total: 162 mg/dL (ref 100–199)
HDL: 62 mg/dL (ref >39)
LDL CALC: 69 mg/dL (ref 0–99)
TRIGLYCERIDES: 154 mg/dL — AB (ref 0–149)
VLDL CHOLESTEROL CAL: 31 mg/dL (ref 5–40)

## 2015-01-09 LAB — TSH: TSH: 0.933 u[IU]/mL (ref 0.450–4.500)

## 2015-01-13 ENCOUNTER — Telehealth: Payer: Self-pay | Admitting: Emergency Medicine

## 2015-01-13 NOTE — Telephone Encounter (Signed)
Patient notified of lab results

## 2015-01-15 ENCOUNTER — Encounter: Payer: Self-pay | Admitting: Family Medicine

## 2015-03-10 ENCOUNTER — Other Ambulatory Visit: Payer: Self-pay | Admitting: Family Medicine

## 2015-03-10 DIAGNOSIS — Z1231 Encounter for screening mammogram for malignant neoplasm of breast: Secondary | ICD-10-CM

## 2015-03-13 ENCOUNTER — Ambulatory Visit
Admission: RE | Admit: 2015-03-13 | Discharge: 2015-03-13 | Disposition: A | Discharge status: Home / Self Care | Payer: Medicare HMO | Source: Ambulatory Visit | Attending: Family Medicine | Admitting: Family Medicine

## 2015-03-13 ENCOUNTER — Other Ambulatory Visit: Payer: Self-pay | Admitting: Family Medicine

## 2015-03-13 DIAGNOSIS — Z1231 Encounter for screening mammogram for malignant neoplasm of breast: Secondary | ICD-10-CM | POA: Insufficient documentation

## 2015-03-16 ENCOUNTER — Encounter: Payer: Self-pay | Admitting: *Deleted

## 2015-03-16 ENCOUNTER — Ambulatory Visit (INDEPENDENT_AMBULATORY_CARE_PROVIDER_SITE_OTHER): Payer: Medicare HMO | Admitting: Nurse Practitioner

## 2015-03-16 VITALS — BP 136/84 | HR 72 | Ht 64.5 in | Wt 158.0 lb

## 2015-03-16 DIAGNOSIS — Z01419 Encounter for gynecological examination (general) (routine) without abnormal findings: Secondary | ICD-10-CM | POA: Diagnosis not present

## 2015-03-16 DIAGNOSIS — M8580 Other specified disorders of bone density and structure, unspecified site: Secondary | ICD-10-CM

## 2015-03-16 DIAGNOSIS — E039 Hypothyroidism, unspecified: Secondary | ICD-10-CM | POA: Diagnosis not present

## 2015-03-16 NOTE — Patient Instructions (Addendum)

## 2015-03-16 NOTE — Progress Notes (Signed)
Patient ID: Theresa Hall, female   DOB: 1940/03/25, 76 y.o.   MRN: [id number]  74 y.o. G2P0002 Widowed Caucasian Fe here for annual exam.  She has had a lapse in coming here due to insurance plan since 05/2012.  States she is now off ERT secondary to cost about 2014. Now history of glaucoma.  Also new diagnosis with renal impairment with an elevated serum creatine and sees nephrologist.  States "Mild" disease only.  She has OA of both knees and maybe takes 2 Tylenol a month.  No migraine HA's since on Topamax.   Patient's last menstrual period was 01/11/1987 (approximate).          Sexually active: No.  The current method of family planning is abstinence. Post hysterectomy   Exercising: No.  The patient does not participate in regular exercise at present. Smoker:  no  Health Maintenance: Pap: 12/01/2000 normal - hysterectomy MMG:03/13/15, [facility], 3D, no results available at time of visit Colonoscopy: 08/2008 normal, repeat in 10 yrs BMD:02/13/12 T Score: 0.5 Spine / -1.8 Left Femur Neck (PCP manages) TDaP: 03/03/10 Shingles: 11/28/07 PNA: 12/17/13 Hep C and HIV: Not indicated due to age Labs: every 6 months with PCP  Urine: Nephrology Discuss Vit D, unsure if she has had tested with PCP   reports that she has never smoked. She has never used smokeless tobacco. She reports that she does not drink alcohol or use illicit drugs.  Past Medical History  Diagnosis Date  . Other and unspecified hyperlipidemia   . Unspecified glaucoma   . Osteoarthritis   . Postablative ovarian failure   . Pure hypercholesterolemia   . Migraine without aura, without mention of intractable migraine without mention of status migrainosus   . Unspecified hypothyroidism   . Allergic rhinitis, cause unspecified   . Diverticulitis of colon (without mention of hemorrhage)   . Chronic kidney disease, stage I   . Other abnormal glucose   . Chronic kidney disease, stage III (moderate)   . Cancer ([place]) 10/2011    basal  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    Past Surgical History  Procedure Laterality Date  . Basal cell carcinoma excision    . Colonoscopy    . Tonsillectomy    . Appendectomy    . Vaginal hysterectomy    . Basal cell carcinoma excision  2014    nasal    Current Outpatient Prescriptions  Medication Sig Dispense Refill  . ASPIRIN LOW DOSE 81 MG chewable tablet     . Calcium Carbonate-Vitamin D (CALCIUM + D PO) Take by mouth 2 (two) times daily.    . Calcium Polycarbophil (FIBERCON PO) Take by mouth daily.    . Fish Oil-Cholecalciferol (FISH OIL + D3) 1200-1000 MG-UNIT CAPS Take by mouth.    . latanoprost (XALATAN) 0.005 % ophthalmic solution PLACE 1 DROP INTO BOTH EYES AT BEDTIME  5  . levothyroxine (SYNTHROID) 88 MCG tablet Take 1 tablet (88 mcg total) by mouth daily. 90 tablet 1  . Multiple Vitamin (MULTIVITAMIN) tablet Take 1 tablet by mouth daily.    Marland Kitchen NASONEX 50 MCG/ACT nasal spray INHALE 2 PUFFS DAILY 17 g 3  . omeprazole (PRILOSEC) 40 MG capsule TAKE 1 CAPSULE EVERY DAY 90 capsule 3  . pravastatin (PRAVACHOL) 80 MG tablet TAKE 1 TABLET EVERY DAY 90 tablet 3  . topiramate (TOPAMAX) 50 MG tablet Take 50 mg by mouth daily.      No current facility-administered medications for this visit.    Family  History  Problem Relation Age of Onset  . Heart attack Father     x2  . Heart disease Sister   . Heart attack Sister   . Heart attack Brother   . Heart failure Brother     ROS:  Pertinent items are noted in HPI.  Otherwise, a comprehensive ROS was negative.  Exam:   BP 136/84 mmHg  Pulse 72  Ht 5' 4.5" (1.638 m)  Wt 158 lb (71.668 kg)  BMI 26.71 kg/m2  LMP 01/11/1987 (Approximate) Height: 5' 4.5" (163.8 cm) (with shoes) Ht Readings from Last 3 Encounters:  03/16/15 5' 4.5" (1.638 m)  01/06/15 5\' 5"  (1.651 m)  06/23/14 5\' 5"  (1.651 m)    General appearance: alert, cooperative and appears stated age Head: Normocephalic, without obvious abnormality, atraumatic Neck: no  adenopathy, supple, symmetrical, trachea midline and thyroid normal to inspection and palpation Lungs: clear to auscultation bilaterally Breasts: normal appearance, no masses or tenderness Heart: regular rate and rhythm Abdomen: soft, non-tender; no masses,  no organomegaly Extremities: extremities normal, atraumatic, no cyanosis or edema Skin: Skin color, texture, turgor normal. No rashes or lesions Lymph nodes: Cervical, supraclavicular, and axillary nodes normal. No abnormal inguinal nodes palpated Neurologic: Grossly normal   Pelvic: External genitalia:  no lesions              Urethra:  normal appearing urethra with no masses, tenderness or lesions              Bartholin's and Skene's: normal                 Vagina: normal appearing vagina with normal color and discharge, no lesions              Cervix: absent              Pap taken: No. Bimanual Exam:  Uterus:  uterus absent              Adnexa: no mass, fullness, tenderness               Rectovaginal: Confirms               Anus:  normal sphincter tone, no lesions  Chaperone present: no  A:  Well Woman with normal exam  S/P TAH/BSO secondary to uterine fibroids 10/1987 - on ERT until early 2014  Chronic kidney disease  Hyperlipidemia  Hypothyroid  History of Migraine HA  Osteopenia hip   P:   Reviewed health and wellness pertinent to exam  Pap smear as above  Mammogram is not due for another year if this one is normal  Counseled on breast self exam, mammography screening, adequate intake of calcium and vitamin D, diet and exercise, Kegel's exercises return annually or prn  An After Visit Summary was printed and given to the patient.

## 2015-03-20 NOTE — Progress Notes (Signed)
Encounter reviewed by Dr. Brook Amundson C. Silva.  

## 2015-03-30 ENCOUNTER — Telehealth: Payer: Self-pay | Admitting: *Deleted

## 2015-03-30 NOTE — Telephone Encounter (Signed)
[facility] in regards to PG message -eh

## 2015-03-30 NOTE — Telephone Encounter (Signed)
Patient called back she said she will get her vitamin d done by either Dr. Marella Chimes or her nephrologist. Also wanted to let Ms. Patty know that she got her results from [facility] and they should be available online.

## 2015-03-30 NOTE — Telephone Encounter (Signed)
-----   Message from [place], [place] sent at 03/30/2015 10:42 AM EDT ----- Please call pt and let her know that we have gotten labs from PCP at [facility] 01/08/15 - Dr. Marella Chimes.  Bu there was no Vit D test.  Does she was want to come back here and get done or get there at next visit?

## 2015-04-14 DIAGNOSIS — H401131 Primary open-angle glaucoma, bilateral, mild stage: Secondary | ICD-10-CM | POA: Diagnosis not present

## 2015-04-30 DIAGNOSIS — D225 Melanocytic nevi of trunk: Secondary | ICD-10-CM | POA: Diagnosis not present

## 2015-04-30 DIAGNOSIS — Z85820 Personal history of malignant melanoma of skin: Secondary | ICD-10-CM | POA: Diagnosis not present

## 2015-04-30 DIAGNOSIS — D3614 Benign neoplasm of peripheral nerves and autonomic nervous system of thorax: Secondary | ICD-10-CM | POA: Diagnosis not present

## 2015-04-30 DIAGNOSIS — L821 Other seborrheic keratosis: Secondary | ICD-10-CM | POA: Diagnosis not present

## 2015-05-14 ENCOUNTER — Telehealth: Payer: Self-pay | Admitting: Family Medicine

## 2015-05-14 NOTE — Telephone Encounter (Signed)
Requesting refill on Synthyriod asking that you send to [facility]. She is completely out. Asking that you DO NOT send the generic brand

## 2015-05-15 ENCOUNTER — Other Ambulatory Visit: Payer: Self-pay | Admitting: Emergency Medicine

## 2015-05-15 MED ORDER — LEVOTHYROXINE SODIUM 88 MCG PO TABS: 88.0000 ug | ORAL_TABLET | Freq: Every day | ORAL | Status: DC

## 2015-05-18 ENCOUNTER — Other Ambulatory Visit: Payer: Self-pay | Admitting: Emergency Medicine

## 2015-05-18 MED ORDER — TOPIRAMATE 50 MG PO TABS: 50.0000 mg | ORAL_TABLET | Freq: Every day | ORAL | Status: DC

## 2015-06-05 ENCOUNTER — Encounter: Payer: Self-pay | Admitting: Family Medicine

## 2015-06-10 ENCOUNTER — Other Ambulatory Visit: Payer: Self-pay | Admitting: Family Medicine

## 2015-06-12 DIAGNOSIS — R69 Illness, unspecified: Secondary | ICD-10-CM | POA: Diagnosis not present

## 2015-06-15 DIAGNOSIS — N2581 Secondary hyperparathyroidism of renal origin: Secondary | ICD-10-CM | POA: Diagnosis not present

## 2015-06-15 DIAGNOSIS — N183 Chronic kidney disease, stage 3 (moderate): Secondary | ICD-10-CM | POA: Diagnosis not present

## 2015-06-15 DIAGNOSIS — D631 Anemia in chronic kidney disease: Secondary | ICD-10-CM | POA: Diagnosis not present

## 2015-06-15 DIAGNOSIS — I10 Essential (primary) hypertension: Secondary | ICD-10-CM | POA: Diagnosis not present

## 2015-06-15 DIAGNOSIS — G43909 Migraine, unspecified, not intractable, without status migrainosus: Secondary | ICD-10-CM | POA: Diagnosis not present

## 2015-06-15 DIAGNOSIS — E785 Hyperlipidemia, unspecified: Secondary | ICD-10-CM | POA: Diagnosis not present

## 2015-06-19 DIAGNOSIS — N183 Chronic kidney disease, stage 3 (moderate): Secondary | ICD-10-CM | POA: Diagnosis not present

## 2015-06-19 DIAGNOSIS — E785 Hyperlipidemia, unspecified: Secondary | ICD-10-CM | POA: Diagnosis not present

## 2015-06-19 DIAGNOSIS — I129 Hypertensive chronic kidney disease with stage 1 through stage 4 chronic kidney disease, or unspecified chronic kidney disease: Secondary | ICD-10-CM | POA: Diagnosis not present

## 2015-06-19 DIAGNOSIS — N2581 Secondary hyperparathyroidism of renal origin: Secondary | ICD-10-CM | POA: Diagnosis not present

## 2015-06-22 ENCOUNTER — Other Ambulatory Visit: Payer: Self-pay | Admitting: Family Medicine

## 2015-06-22 ENCOUNTER — Encounter: Payer: Self-pay | Admitting: Family Medicine

## 2015-06-22 ENCOUNTER — Ambulatory Visit (INDEPENDENT_AMBULATORY_CARE_PROVIDER_SITE_OTHER): Payer: Medicare HMO | Admitting: Family Medicine

## 2015-06-22 VITALS — BP 120/76 | HR 104 | Temp 98.6°F | Resp 18 | Ht 65.0 in | Wt 157.1 lb

## 2015-06-22 DIAGNOSIS — E039 Hypothyroidism, unspecified: Secondary | ICD-10-CM

## 2015-06-22 DIAGNOSIS — K2100 Gastro-esophageal reflux disease with esophagitis, without bleeding: Secondary | ICD-10-CM

## 2015-06-22 DIAGNOSIS — G43009 Migraine without aura, not intractable, without status migrainosus: Secondary | ICD-10-CM | POA: Diagnosis not present

## 2015-06-22 DIAGNOSIS — N183 Chronic kidney disease, stage 3 unspecified: Secondary | ICD-10-CM

## 2015-06-22 DIAGNOSIS — E785 Hyperlipidemia, unspecified: Secondary | ICD-10-CM | POA: Diagnosis not present

## 2015-06-22 MED ORDER — TOPIRAMATE 50 MG PO TABS: 50.0000 mg | ORAL_TABLET | Freq: Every day | ORAL | Status: DC

## 2015-06-22 MED ORDER — MOMETASONE FUROATE 50 MCG/ACT NA SUSP: 2.0000 | Freq: Every day | NASAL | Status: DC

## 2015-06-22 MED ORDER — RANITIDINE HCL 150 MG PO TABS: 150.0000 mg | ORAL_TABLET | Freq: Two times a day (BID) | ORAL | Status: DC

## 2015-06-22 MED ORDER — LEVOTHYROXINE SODIUM 88 MCG PO TABS: 88.0000 ug | ORAL_TABLET | Freq: Every day | ORAL | Status: DC

## 2015-06-22 MED ORDER — PRAVASTATIN SODIUM 80 MG PO TABS: 80.0000 mg | ORAL_TABLET | Freq: Every day | ORAL | Status: DC

## 2015-06-23 ENCOUNTER — Telehealth: Payer: Self-pay

## 2015-06-23 MED ORDER — RANITIDINE HCL 150 MG PO TABS: 150.0000 mg | ORAL_TABLET | Freq: Two times a day (BID) | ORAL | Status: DC

## 2015-06-23 NOTE — Telephone Encounter (Signed)
Done

## 2015-06-23 NOTE — Telephone Encounter (Signed)
Sent to [facility] 

## 2015-06-23 NOTE — Progress Notes (Signed)
Date:  06/22/2015   Name:  Theresa Hall   DOB:  [birth year]   MRN:  [id number]  PCP:  Ashok [place], MD    Chief Complaint: Medication Refill   History of Present Illness:  This is a 75 y.o. female seen in six month f/u. On Topamax for migraines, well controlled, considering d/c. HLD on Pravachol, LDL 69 in December. On Prilosec daily for GERD., willing to try change to Zantac. CKD stage 3 sees nephro q78m Glaucoma sees ophtho q657mAR on Nasonex well controlled, on Fibercon for diverticulosis.  Review of Systems:  Review of Systems  Constitutional: Negative for fever and fatigue.  Respiratory: Negative for cough and shortness of breath.   Cardiovascular: Negative for chest pain and leg swelling.  Neurological: Negative for syncope and light-headedness.    Patient Active Problem List   Diagnosis Date Noted  . Reflux esophagitis 06/23/2014  . HLD (hyperlipidemia) 06/23/2014  . Palpitations 06/23/2014  . Chronic kidney disease (CKD), stage III (moderate) 08/24/2009  . Diverticulitis of colon 05/14/2008  . Adult hypothyroidism 12/04/2006  . Migraine without aura 08/02/2006  . 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 Authorizing Provider  ASPIRIN LOW DOSE 81 MG chewable tablet Chew 1 tablet by mouth daily. 12/19/14   Historical Provider, MD  Calcium Polycarbophil (FIBERCON PO) Take by mouth daily.    Historical Provider, MD  Fish Oil-Cholecalciferol (FISH OIL + D3) 1200-1000 MG-UNIT CAPS Take 1 capsule by mouth daily.    Historical Provider, MD  latanoprost (XALATAN) 0.005 % ophthalmic solution PLACE 1 DROP INTO BOTH EYES AT BEDTIME 05/26/14   Historical Provider, MD  levothyroxine (SYNTHROID) 88 MCG tablet Take 1 tablet (88 mcg total) by mouth daily. 06/22/15   WiAdline PotterMD  mometasone (NASONEX) 50 MCG/ACT nasal spray Place 2 sprays into the nose daily. 06/22/15   WiAdline PotterMD  Multiple  Vitamin (MULTIVITAMIN) tablet Take 1 tablet by mouth daily.    Historical Provider, MD  pravastatin (PRAVACHOL) 80 MG tablet Take 1 tablet (80 mg total) by mouth daily. 06/22/15   WiAdline PotterMD  ranitidine (ZANTAC) 150 MG tablet Take 1 tablet (150 mg total) by mouth 2 (two) times daily. 06/23/15   WiAdline PotterMD  topiramate (TOPAMAX) 50 MG tablet Take 1 tablet (50 mg total) by mouth daily. 06/22/15   WiAdline PotterMD    Allergies  Allergen Reactions  . Codeine Other (See Comments)    Patient had insomnia and nausea    Past Surgical History  Procedure Laterality Date  . Basal cell carcinoma excision    . Colonoscopy    . Tonsillectomy    . Appendectomy    . Vaginal hysterectomy    . Basal cell carcinoma excision  2014    nasal    Social History  Substance Use Topics  . Smoking status: Never Smoker   . Smokeless tobacco: Never Used  . Alcohol Use: No    Family History  Problem Relation Age of Onset  . Heart attack Father     x2  . Heart disease Sister   . Heart attack Sister   . Heart attack Brother   . Heart failure Brother     Medication list has been reviewed and updated.  Physical Examination: BP 120/76 mmHg  Pulse 104  Temp(Src) 98.6 F (37 C)  Resp 18  Ht 5' 5"  (1.651 m)  Wt 157 lb 2  oz (71.271 kg)  BMI 26.15 kg/m2  SpO2 97%  LMP 01/11/1987 (Approximate)  Physical Exam  Constitutional: She appears well-developed and well-nourished.  Cardiovascular: Normal rate, regular rhythm and normal heart sounds.   Pulmonary/Chest: Effort normal and breath sounds normal.  Musculoskeletal: She exhibits no edema.  Neurological: She is alert.  Skin: Skin is warm and dry.  Psychiatric: She has a normal mood and affect. Her behavior is normal.  Nursing note and vitals reviewed.   Assessment and Plan:  1. Migraine without aura and without status migrainosus, not intractable Well controlled, continue asa/Topamax, consider d/c Topamax in future  2. Reflux  esophagitis Change omeprazole to Zantac 150 mg bid  3. Hypothyroidism, unspecified hypothyroidism type Well controlled on Synthroid  4. Chronic kidney disease (CKD), stage III (moderate) eGFR 55 in December, followed by nephro, d/c calcium supp, avoid NSAIDS (discuss asa use with nephro)  5. HLD (hyperlipidemia) Well controlled on Pravachol  Return in about 6 months (around 12/22/2015).  Satira Anis. [facility]  06/23/2015

## 2015-06-23 NOTE — Addendum Note (Signed)
Addended by: Adline Potter on: 06/23/2015 10:29 AM   Modules accepted: Orders

## 2015-07-06 ENCOUNTER — Ambulatory Visit: Payer: Commercial Managed Care - HMO | Admitting: Family Medicine

## 2015-08-14 ENCOUNTER — Emergency Department: Payer: PRIVATE HEALTH INSURANCE

## 2015-08-14 ENCOUNTER — Observation Stay
Admission: EM | Admit: 2015-08-14 | Discharge: 2015-08-17 | Disposition: A | Discharge status: Skilled Nursing Facility | Payer: PRIVATE HEALTH INSURANCE | Attending: Surgery | Admitting: Surgery

## 2015-08-14 ENCOUNTER — Encounter: Payer: Self-pay | Admitting: *Deleted

## 2015-08-14 DIAGNOSIS — S82001A Unspecified fracture of right patella, initial encounter for closed fracture: Secondary | ICD-10-CM | POA: Diagnosis not present

## 2015-08-14 DIAGNOSIS — M1990 Unspecified osteoarthritis, unspecified site: Secondary | ICD-10-CM | POA: Insufficient documentation

## 2015-08-14 DIAGNOSIS — Y9289 Other specified places as the place of occurrence of the external cause: Secondary | ICD-10-CM | POA: Insufficient documentation

## 2015-08-14 DIAGNOSIS — S82041A Displaced comminuted fracture of right patella, initial encounter for closed fracture: Secondary | ICD-10-CM | POA: Diagnosis not present

## 2015-08-14 DIAGNOSIS — E7800 Pure hypercholesterolemia, unspecified: Secondary | ICD-10-CM | POA: Insufficient documentation

## 2015-08-14 DIAGNOSIS — W19XXXA Unspecified fall, initial encounter: Secondary | ICD-10-CM | POA: Diagnosis not present

## 2015-08-14 DIAGNOSIS — K210 Gastro-esophageal reflux disease with esophagitis: Secondary | ICD-10-CM | POA: Insufficient documentation

## 2015-08-14 DIAGNOSIS — W010XXA Fall on same level from slipping, tripping and stumbling without subsequent striking against object, initial encounter: Secondary | ICD-10-CM | POA: Insufficient documentation

## 2015-08-14 DIAGNOSIS — Z9049 Acquired absence of other specified parts of digestive tract: Secondary | ICD-10-CM | POA: Diagnosis not present

## 2015-08-14 DIAGNOSIS — E039 Hypothyroidism, unspecified: Secondary | ICD-10-CM | POA: Diagnosis not present

## 2015-08-14 DIAGNOSIS — H409 Unspecified glaucoma: Secondary | ICD-10-CM | POA: Insufficient documentation

## 2015-08-14 DIAGNOSIS — Z85828 Personal history of other malignant neoplasm of skin: Secondary | ICD-10-CM | POA: Diagnosis not present

## 2015-08-14 DIAGNOSIS — Y939 Activity, unspecified: Secondary | ICD-10-CM | POA: Diagnosis not present

## 2015-08-14 DIAGNOSIS — E785 Hyperlipidemia, unspecified: Secondary | ICD-10-CM | POA: Diagnosis not present

## 2015-08-14 DIAGNOSIS — N183 Chronic kidney disease, stage 3 (moderate): Secondary | ICD-10-CM | POA: Diagnosis not present

## 2015-08-14 DIAGNOSIS — R002 Palpitations: Secondary | ICD-10-CM | POA: Diagnosis not present

## 2015-08-14 DIAGNOSIS — M8580 Other specified disorders of bone density and structure, unspecified site: Secondary | ICD-10-CM | POA: Diagnosis not present

## 2015-08-14 DIAGNOSIS — Z885 Allergy status to narcotic agent status: Secondary | ICD-10-CM | POA: Insufficient documentation

## 2015-08-14 DIAGNOSIS — Z90710 Acquired absence of both cervix and uterus: Secondary | ICD-10-CM | POA: Insufficient documentation

## 2015-08-14 DIAGNOSIS — K5732 Diverticulitis of large intestine without perforation or abscess without bleeding: Secondary | ICD-10-CM | POA: Insufficient documentation

## 2015-08-14 DIAGNOSIS — G43009 Migraine without aura, not intractable, without status migrainosus: Secondary | ICD-10-CM | POA: Insufficient documentation

## 2015-08-14 DIAGNOSIS — Z01818 Encounter for other preprocedural examination: Secondary | ICD-10-CM | POA: Diagnosis not present

## 2015-08-14 DIAGNOSIS — Z8249 Family history of ischemic heart disease and other diseases of the circulatory system: Secondary | ICD-10-CM | POA: Diagnosis not present

## 2015-08-14 DIAGNOSIS — Z419 Encounter for procedure for purposes other than remedying health state, unspecified: Secondary | ICD-10-CM

## 2015-08-14 DIAGNOSIS — M25561 Pain in right knee: Secondary | ICD-10-CM | POA: Diagnosis not present

## 2015-08-14 LAB — COMPREHENSIVE METABOLIC PANEL
ALT: 31 U/L (ref 14–54)
AST: 30 U/L (ref 15–41)
Albumin: 4.4 g/dL (ref 3.5–5.0)
Alkaline Phosphatase: 92 U/L (ref 38–126)
Anion gap: 8 (ref 5–15)
BUN: 20 mg/dL (ref 6–20)
CHLORIDE: 109 mmol/L (ref 101–111)
CO2: 23 mmol/L (ref 22–32)
CREATININE: 0.97 mg/dL (ref 0.44–1.00)
Calcium: 9.5 mg/dL (ref 8.9–10.3)
GFR calc non Af Amer: 55 mL/min — ABNORMAL LOW (ref >60)
Glucose, Bld: 96 mg/dL (ref 65–99)
POTASSIUM: 3.8 mmol/L (ref 3.5–5.1)
SODIUM: 140 mmol/L (ref 135–145)
Total Bilirubin: 0.7 mg/dL (ref 0.3–1.2)
Total Protein: 7.1 g/dL (ref 6.5–8.1)

## 2015-08-14 LAB — CBC WITH DIFFERENTIAL/PLATELET
Basophils Absolute: 0.1 10*3/uL (ref 0–0.1)
Basophils Relative: 1 %
EOS ABS: 0.2 10*3/uL (ref 0–0.7)
Eosinophils Relative: 3 %
HEMATOCRIT: 39.4 % (ref 35.0–47.0)
HEMOGLOBIN: 13.8 g/dL (ref 12.0–16.0)
LYMPHS ABS: 2.1 10*3/uL (ref 1.0–3.6)
LYMPHS PCT: 33 %
MCH: 31.8 pg (ref 26.0–34.0)
MCHC: 35.1 g/dL (ref 32.0–36.0)
MCV: 90.5 fL (ref 80.0–100.0)
MONOS PCT: 8 %
Monocytes Absolute: 0.5 10*3/uL (ref 0.2–0.9)
NEUTROS PCT: 55 %
Neutro Abs: 3.6 10*3/uL (ref 1.4–6.5)
Platelets: 188 10*3/uL (ref 150–440)
RBC: 4.35 MIL/uL (ref 3.80–5.20)
RDW: 12.7 % (ref 11.5–14.5)
WBC: 6.4 10*3/uL (ref 3.6–11.0)

## 2015-08-14 MED ORDER — ONDANSETRON HCL 4 MG/2ML IJ SOLN
4.0000 mg | Freq: Four times a day (QID) | INTRAMUSCULAR | Status: DC | PRN
  Administered 2015-08-14 – 2015-08-15 (×2): 4 mg via INTRAVENOUS
  Filled 2015-08-14 (×2): qty 2

## 2015-08-14 MED ORDER — PANTOPRAZOLE SODIUM 40 MG PO TBEC
40.0000 mg | DELAYED_RELEASE_TABLET | Freq: Every day | ORAL | Status: DC
  Administered 2015-08-16 – 2015-08-17 (×2): 40 mg via ORAL
  Filled 2015-08-14 (×3): qty 1

## 2015-08-14 MED ORDER — HYDROMORPHONE HCL 1 MG/ML IJ SOLN
0.5000 mg | INTRAMUSCULAR | Status: DC | PRN
  Administered 2015-08-14 – 2015-08-15 (×2): 1 mg via INTRAVENOUS
  Filled 2015-08-14 (×3): qty 1

## 2015-08-14 MED ORDER — CEFAZOLIN SODIUM-DEXTROSE 2-4 GM/100ML-% IV SOLN
2.0000 g | INTRAVENOUS | Status: AC
  Administered 2015-08-15: 2 g via INTRAVENOUS
  Filled 2015-08-14: qty 100

## 2015-08-14 MED ORDER — OXYCODONE HCL 5 MG PO TABS: 5.0000 mg | ORAL_TABLET | ORAL | Status: DC | PRN

## 2015-08-14 MED ORDER — HYDROMORPHONE HCL 1 MG/ML IJ SOLN
1.0000 mg | Freq: Once | INTRAMUSCULAR | Status: AC
  Administered 2015-08-14: 1 mg via INTRAVENOUS
  Filled 2015-08-14: qty 1

## 2015-08-14 MED ORDER — ONDANSETRON HCL 4 MG/2ML IJ SOLN
4.0000 mg | Freq: Once | INTRAMUSCULAR | Status: AC
  Administered 2015-08-14: 4 mg via INTRAVENOUS
  Filled 2015-08-14: qty 2

## 2015-08-14 NOTE — ED Notes (Signed)
Attempted to call report. RN in room.

## 2015-08-14 NOTE — ED Triage Notes (Signed)
Per EMS report, [place] was at church and slipped on a puddle of water and fell on right knee. Patient reports no LOC or head trauma.

## 2015-08-14 NOTE — ED Provider Notes (Signed)
[facility] Emergency Department Provider Note  ____________________________________________  Time seen: Approximately 7:56 PM  I have reviewed the triage vital signs and the nursing notes.   HISTORY  Chief Complaint Fall    HPI Theresa Hall is a 75 y.o. female presenting to the emergency department status post fall this evening. Patient states she was walking on the [place] floor at the [employer] when she hit a wet spot on the floor and fell on her right knee "with every bit of weight". Patient reports she is unable to bear weight on right leg since the fall. Motionless the pain is 5/10, but is 10/10 with movement. Denies injury to any other part of the body, back pain, neck pain, LOC, headache, nausea, shortness of breath, chest pain, numbness/tingling, or loss of sensation.   Past Medical History:  Diagnosis Date  . Allergic rhinitis, cause unspecified   . 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 Chronic kidney disease, stage I   . Chronic kidney disease, stage III (moderate)   . Diverticulitis of colon (without mention of hemorrhage)   . Migraine without aura, without mention of intractable migraine without mention of status migrainosus   . Osteoarthritis   . Osteopenia   . Other abnormal glucose   . Other and unspecified hyperlipidemia   . Postablative ovarian failure   . Pure hypercholesterolemia   . Unspecified glaucoma   . Unspecified hypothyroidism     Patient Active Problem List   Diagnosis Date Noted  . Right patella fracture 08/14/2015  . Reflux esophagitis 06/23/2014  . HLD (hyperlipidemia) 06/23/2014  . Palpitations 06/23/2014  . Chronic kidney disease (CKD), stage III (moderate) 08/24/2009  . Diverticulitis of colon 05/14/2008  . Adult hypothyroidism 12/04/2006  . Migraine without aura 08/02/2006  . 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 APPENDECTOMY    .  BASAL CELL CARCINOMA EXCISION    . BASAL CELL CARCINOMA EXCISION  2014   nasal  . CHOLECYSTECTOMY    . COLONOSCOPY    . TONSILLECTOMY    . VAGINAL HYSTERECTOMY      Prior to Admission medications   Medication Sig Start Date End Date Taking? Authorizing Provider  ASPIRIN LOW DOSE 81 MG chewable tablet Chew 1 tablet by mouth daily. 12/19/14   Historical Provider, MD  Calcium Polycarbophil (FIBERCON PO) Take by mouth daily.    Historical Provider, MD  Fish Oil-Cholecalciferol (FISH OIL + D3) 1200-1000 MG-UNIT CAPS Take 1 capsule by mouth daily.    Historical Provider, MD  latanoprost (XALATAN) 0.005 % ophthalmic solution PLACE 1 DROP INTO BOTH EYES AT BEDTIME 05/26/14   Historical Provider, MD  levothyroxine (SYNTHROID) 88 MCG tablet Take 1 tablet (88 mcg total) by mouth daily. 06/22/15   Adline Potter, MD  mometasone (NASONEX) 50 MCG/ACT nasal spray Place 2 sprays into the nose daily. 06/22/15   Adline Potter, MD  Multiple Vitamin (MULTIVITAMIN) tablet Take 1 tablet by mouth daily.    Historical Provider, MD  pravastatin (PRAVACHOL) 80 MG tablet Take 1 tablet (80 mg total) by mouth daily. 06/22/15   Adline Potter, MD  ranitidine (ZANTAC) 150 MG tablet Take 1 tablet (150 mg total) by mouth 2 (two) times daily. 06/23/15   Adline Potter, MD  topiramate (TOPAMAX) 50 MG tablet Take 1 tablet (50 mg total) by mouth daily. 06/22/15   Adline Potter, MD    Allergies Codeine  Family History  Problem Relation Age of Onset  . Heart attack Father     x2  . Heart disease Sister   . Heart attack Sister   . Heart attack Brother   . Heart failure Brother     Social History Social History  Substance Use Topics  . Smoking status: Never Smoker  . Smokeless tobacco: Never Used  . Alcohol use No     Review of Systems  Constitutional: No fever/chills Eyes: No visual changes. No discharge ENT: No upper respiratory complaints. Cardiovascular: no chest pain. Respiratory: no cough. No  SOB. Gastrointestinal: No abdominal pain.  No nausea, no vomiting.   Genitourinary: Negative for dysuria. No hematuria Musculoskeletal: Positive for right knee pain Skin: Positive for right knee abrasion Neurological: Negative for headaches, focal weakness or numbness. 10-point ROS otherwise negative.  ____________________________________________   PHYSICAL EXAM:  VITAL SIGNS: ED Triage Vitals [08/14/15 1920]  Enc Vitals Group     BP (!) 170/77     Pulse Rate 66     Resp 18     Temp 98.6 F (37 C)     Temp Source Oral     SpO2 99 %     Weight 161 lb (73 kg)     Height 5\' 5"  (1.651 m)     Head Circumference      Peak Flow      Pain Score 4     Pain Loc      Pain Edu?      Excl. in [place]?      Constitutional: Alert and oriented. Well appearing and in no acute distress. Eyes: Conjunctivae are normal. PERRL.  Head: Atraumatic. ENT:      Ears:       Nose: No congestion/rhinnorhea.      Mouth/Throat: Mucous membranes are moist.  Neck: No stridor. No cervical spine tenderness to palpation. Hematological/Lymphatic/Immunilogical: No cervical lymphadenopathy. Cardiovascular: Normal rate, regular rhythm. Normal S1 and S2.  Good peripheral circulation. Respiratory: Normal respiratory effort without tachypnea or retractions. Lungs CTAB. Good air entry to the bases with no decreased or absent breath sounds. Gastrointestinal: Bowel sounds 4 quadrants. Soft and nontender to palpation. No guarding or rigidity. No palpable masses. No distention. No CVA tenderness. Musculoskeletal: Limited range of motion in right knee with obvious swelling and tenderness. Pulses, sensations, and FROM intact right foot. Small, superficial abrasion on right knee joint. No skin pallor or discoloration.  Neurologic:  Normal speech and language. No gross focal neurologic deficits are appreciated.  Skin:  Right knee abrasion.  Psychiatric: Mood and affect are normal. Speech and behavior are normal. Patient  exhibits appropriate insight and judgement.   ____________________________________________   LABS (all labs ordered are listed, but only abnormal results are displayed)  Labs Reviewed  COMPREHENSIVE METABOLIC PANEL - Abnormal; Notable for the following:       Result Value   GFR calc non Af Amer 55 (*)    All other components within normal limits  CBC WITH DIFFERENTIAL/PLATELET   ____________________________________________  EKG  EKG reveals normal sinus rhythm at a rate of 62 bpm. No ST elevations or depressions noted. PR, QRS, QT intervals within normal limits. No Q waves or delta waves and affect.  ____________________________________________  RADIOLOGY Diamantina Providence Alesha Jaffee, personally viewed and evaluated these images (plain radiographs) as part of my medical decision making, as well as reviewing the written report by the radiologist.  Dg Chest 1 View  Result Date: 08/14/2015 CLINICAL DATA:  Preoperative chest x-ray  EXAM: CHEST 1 VIEW COMPARISON:  April 24, 2013 FINDINGS: The heart size and mediastinal contours are within normal limits. Both lungs are clear. The visualized skeletal structures are unremarkable. IMPRESSION: No active disease. Electronically Signed   By: Dorise Bullion III M.D   On: 08/14/2015 21:58   Dg Knee Complete 4 Views Right  Result Date: 08/14/2015 CLINICAL DATA:  Slipped on a bottle of water intra [facility] and fall tonight. Right knee pain. EXAM: RIGHT KNEE - COMPLETE 4+ VIEW COMPARISON:  Radiographs 02/16/2009 FINDINGS: Comminuted patellar fracture with at least 3 dominant patellar fragments. Dominant fracture plane is transverse were there is displacement of 4-5 mm. Associated joint effusion. No additional acute fracture. Mild degenerative change. IMPRESSION: Comminuted mildly displaced patellar fracture with associated joint effusion. Electronically Signed   By: Jeb Levering M.D.   On: 08/14/2015 20:36     ____________________________________________    PROCEDURES  Procedure(s) performed:    Procedures    Medications  pantoprazole (PROTONIX) EC tablet 40 mg (not administered)  ceFAZolin (ANCEF) IVPB 2g/100 mL premix (not administered)  ondansetron (ZOFRAN) injection 4 mg (4 mg Intravenous Given 08/14/15 2149)  HYDROmorphone (DILAUDID) injection 1 mg (1 mg Intravenous Given 08/14/15 2149)     ____________________________________________   INITIAL IMPRESSION / ASSESSMENT AND PLAN / ED COURSE  Pertinent labs & imaging results that were available during my care of the patient were reviewed by me and considered in my medical decision making (see chart for details).    Patient's diagnosis is consistent with fall resulting in fracture of the right patella. Patient had a comminuted and displaced fracture to the right patella via x-ray. Patient was in extreme pain. Patient does have support at home but does not feel as if she would be able to manage at home with pain and ambulation. Dr Roland Rack, the on call orthopedic surgeon, was contacted palpation. He reviewed imaging and presented to the emergency department to discuss care plan with patient. At this time, it is felt the patient would best be suited with inpatient admission for surgical fixation tomorrow. Preop labs, chest x-ray, EKG ordered at this time. Patient will be admitted to the care of Dr Roland Rack for further treatment..     ____________________________________________  FINAL CLINICAL IMPRESSION(S) / ED DIAGNOSES  Final diagnoses:  Fall, initial encounter  Patellar fracture, right, closed, initial encounter      NEW MEDICATIONS STARTED DURING THIS VISIT:  New Prescriptions   No medications on file        This chart was dictated using voice recognition software/Dragon. Despite best efforts to proofread, errors can occur which can change the meaning. Any change was purely unintentional.   Darletta Moll,  PA-C 08/14/15 2242    Rudene Re, MD 08/15/15 [phone]

## 2015-08-14 NOTE — H&P (Signed)
Subjective:  Chief complaint:  Right knee pain.  The patient is a 75 y.o. female who sustained an injury to the right knee earlier this evening. Apparently, while at church, she tripped and fell onto her flexed knee. She was unable to get up and was brought to the emergency room where x-rays demonstrated a comminuted mildly displaced patella fracture. The patient denies any associated injuries. She did not strike her head or lose consciousness. The patient also denies any light-headedness, loss of consciousness, chest pain, or shortness of breath which might have contributed to the injury.  Patient Active Problem List   Diagnosis Date Noted  . Reflux esophagitis 06/23/2014  . HLD (hyperlipidemia) 06/23/2014  . Palpitations 06/23/2014  . Chronic kidney disease (CKD), stage III (moderate) 08/24/2009  . Diverticulitis of colon 05/14/2008  . Adult hypothyroidism 12/04/2006  . Migraine without aura 08/02/2006  . 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 Allergic rhinitis, cause unspecified   . 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 Chronic kidney disease, stage I   . Chronic kidney disease, stage III (moderate)   . Diverticulitis of colon (without mention of hemorrhage)   . Migraine without aura, without mention of intractable migraine without mention of status migrainosus   . Osteoarthritis   . Osteopenia   . Other abnormal glucose   . Other and unspecified hyperlipidemia   . Postablative ovarian failure   . Pure hypercholesterolemia   . Unspecified glaucoma   . Unspecified hypothyroidism     Past Surgical History:  Procedure Laterality Date  . APPENDECTOMY    . BASAL CELL CARCINOMA EXCISION    . BASAL CELL CARCINOMA EXCISION  2014   nasal  . CHOLECYSTECTOMY    . COLONOSCOPY    . TONSILLECTOMY    . VAGINAL HYSTERECTOMY       (Not in a hospital admission) Allergies  Allergen Reactions  . Codeine Other (See  Comments)    Patient had insomnia and nausea    Social History  Substance Use Topics  . Smoking status: Never Smoker  . Smokeless tobacco: Never Used  . Alcohol use No    Family History  Problem Relation Age of Onset  . Heart attack Father     x2  . Heart disease Sister   . Heart attack Sister   . Heart attack Brother   . Heart failure Brother      Review of Systems: As noted above. The patient denies any chest pain, shortness of breath, nausea, vomiting, diarrhea, constipation, belly pain, blood in his/her stool, or burning with urination.  Objective: Temp:  [98.6 F (37 C)] 98.6 F (37 C) (08/04 1920) Pulse Rate:  [66] 66 (08/04 1920) Resp:  [18] 18 (08/04 1920) BP: (170)/(77) 170/77 (08/04 1920) SpO2:  [99 %] 99 % (08/04 1920) Weight:  [73 kg (161 lb)] 73 kg (161 lb) (08/04 1920)  Physical Exam: General:  Alert, no acute distress Psychiatric:  Patient is competent for consent with normal mood and affect  Cardiovascular:  RRR  Respiratory:  Clear to auscultation. No wheezing. Non-labored breathing GI:  Abdomen is soft and non-tender Skin:  No lesions in the area of chief complaint Neurologic:  Sensation intact distally Lymphatic:  No axillary or cervical lymphadenopathy  Orthopedic Exam:  Orthopedic examination is limited to the right knee and lower extremity. There is a small superficial abrasion on the anterior aspect of her right knee without  any surrounding erythema or bleeding. There is moderate swelling over the prepatellar region, as well as a 1-2+ effusion within the knee itself. There is moderate tenderness to palpation over the prepatellar region. She has more severe pain with any attempted active or passive range of motion of the knee, and is unable to perform an active straight leg raise. She is neurovascularly intact to right lower extremity and foot.  Imaging Review: Recent x-rays of the right knee are available for review.  These films demonstrate a  three-part mildly displaced fracture of the patella. No other bony abnormalities are identified.  Assessment: Mildly displaced three-part fracture right patella.  Plan: The treatment options, including medical management versus surgical management, have been discussed in detail with the patient. The patient would like to proceed with surgical intervention to include open reduction and internal fixation of the patella fracture. The risks (including bleeding, infection, nerve and/or blood vessel injury, persistent or recurrent pain, malunion and/or nonunion, development of degenerative joint disease, need for further surgery, blood clots, strokes, heart attacks or arrhythmias, pneumonia, etc.) and benefits of a total hip arthroplasty were discussed. The patient states her understanding and agrees to proceed. A formal written consent will be obtained by the nursing staff.

## 2015-08-15 ENCOUNTER — Inpatient Hospital Stay: Payer: PRIVATE HEALTH INSURANCE | Admitting: Anesthesiology

## 2015-08-15 ENCOUNTER — Encounter
Admission: EM | Disposition: A | Discharge status: Skilled Nursing Facility | Payer: Self-pay | Source: Home / Self Care | Attending: Emergency Medicine

## 2015-08-15 ENCOUNTER — Encounter: Payer: Self-pay | Admitting: Anesthesiology

## 2015-08-15 ENCOUNTER — Inpatient Hospital Stay: Payer: PRIVATE HEALTH INSURANCE

## 2015-08-15 DIAGNOSIS — E119 Type 2 diabetes mellitus without complications: Secondary | ICD-10-CM | POA: Diagnosis not present

## 2015-08-15 DIAGNOSIS — S82041A Displaced comminuted fracture of right patella, initial encounter for closed fracture: Secondary | ICD-10-CM | POA: Diagnosis not present

## 2015-08-15 DIAGNOSIS — E785 Hyperlipidemia, unspecified: Secondary | ICD-10-CM | POA: Diagnosis not present

## 2015-08-15 DIAGNOSIS — S82001A Unspecified fracture of right patella, initial encounter for closed fracture: Secondary | ICD-10-CM | POA: Diagnosis not present

## 2015-08-15 HISTORY — PX: ORIF PATELLA: SHX5033

## 2015-08-15 LAB — SURGICAL PCR SCREEN
MRSA, PCR: NEGATIVE
STAPHYLOCOCCUS AUREUS: NEGATIVE

## 2015-08-15 SURGERY — OPEN REDUCTION INTERNAL FIXATION (ORIF) PATELLA
Anesthesia: General | Site: Knee | Laterality: Right | Wound class: Clean

## 2015-08-15 MED ORDER — MAGNESIUM HYDROXIDE 400 MG/5ML PO SUSP
30.0000 mL | Freq: Every day | ORAL | Status: DC | PRN
  Filled 2015-08-15: qty 30

## 2015-08-15 MED ORDER — BUPIVACAINE-EPINEPHRINE 0.5% -1:200000 IJ SOLN
INTRAMUSCULAR | Status: DC | PRN
  Administered 2015-08-15: 20 mL

## 2015-08-15 MED ORDER — MIDAZOLAM HCL 2 MG/2ML IJ SOLN
INTRAMUSCULAR | Status: DC | PRN
  Administered 2015-08-15 (×2): 1 mg via INTRAVENOUS

## 2015-08-15 MED ORDER — DOCUSATE SODIUM 100 MG PO CAPS
100.0000 mg | ORAL_CAPSULE | Freq: Two times a day (BID) | ORAL | Status: DC
  Administered 2015-08-15 – 2015-08-17 (×5): 100 mg via ORAL
  Filled 2015-08-15 (×5): qty 1

## 2015-08-15 MED ORDER — FLEET ENEMA 7-19 GM/118ML RE ENEM: 1.0000 | ENEMA | Freq: Once | RECTAL | Status: DC | PRN

## 2015-08-15 MED ORDER — LEVOTHYROXINE SODIUM 88 MCG PO TABS
88.0000 ug | ORAL_TABLET | Freq: Every day | ORAL | Status: DC
  Administered 2015-08-16: 88 ug via ORAL
  Filled 2015-08-15: qty 1

## 2015-08-15 MED ORDER — LATANOPROST 0.005 % OP SOLN
1.0000 [drp] | Freq: Every day | OPHTHALMIC | Status: DC
  Administered 2015-08-15 – 2015-08-16 (×2): 1 [drp] via OPHTHALMIC
  Filled 2015-08-15: qty 2.5

## 2015-08-15 MED ORDER — ADULT MULTIVITAMIN W/MINERALS CH
1.0000 | ORAL_TABLET | Freq: Every day | ORAL | Status: DC
  Administered 2015-08-16 – 2015-08-17 (×2): 1 via ORAL
  Filled 2015-08-15 (×4): qty 1

## 2015-08-15 MED ORDER — ASPIRIN 81 MG PO CHEW
81.0000 mg | CHEWABLE_TABLET | Freq: Every day | ORAL | Status: DC
  Administered 2015-08-16 – 2015-08-17 (×2): 81 mg via ORAL
  Filled 2015-08-15 (×2): qty 1

## 2015-08-15 MED ORDER — CEFAZOLIN SODIUM-DEXTROSE 2-4 GM/100ML-% IV SOLN
2.0000 g | Freq: Four times a day (QID) | INTRAVENOUS | Status: AC
  Administered 2015-08-15 – 2015-08-16 (×3): 2 g via INTRAVENOUS
  Filled 2015-08-15 (×3): qty 100

## 2015-08-15 MED ORDER — HYDROMORPHONE HCL 1 MG/ML IJ SOLN
0.5000 mg | INTRAMUSCULAR | Status: DC | PRN
Start: 2015-08-15 — End: 2015-08-17
  Administered 2015-08-15 – 2015-08-16 (×3): 1 mg via INTRAVENOUS
  Filled 2015-08-15 (×4): qty 1

## 2015-08-15 MED ORDER — FENTANYL CITRATE (PF) 100 MCG/2ML IJ SOLN: 25.0000 ug | INTRAMUSCULAR | Status: DC | PRN

## 2015-08-15 MED ORDER — FENTANYL CITRATE (PF) 100 MCG/2ML IJ SOLN
INTRAMUSCULAR | Status: DC | PRN
  Administered 2015-08-15: 50 ug via INTRAVENOUS

## 2015-08-15 MED ORDER — METOCLOPRAMIDE HCL 10 MG PO TABS: 5.0000 mg | ORAL_TABLET | Freq: Three times a day (TID) | ORAL | Status: DC | PRN

## 2015-08-15 MED ORDER — OXYCODONE HCL 5 MG PO TABS
5.0000 mg | ORAL_TABLET | ORAL | Status: DC | PRN
  Administered 2015-08-15: 5 mg via ORAL
  Administered 2015-08-16: 10 mg via ORAL
  Filled 2015-08-15: qty 1
  Filled 2015-08-15: qty 2

## 2015-08-15 MED ORDER — ACETAMINOPHEN 500 MG PO TABS
1000.0000 mg | ORAL_TABLET | Freq: Four times a day (QID) | ORAL | Status: AC
  Administered 2015-08-15 – 2015-08-16 (×3): 1000 mg via ORAL
  Filled 2015-08-15 (×3): qty 2

## 2015-08-15 MED ORDER — BISACODYL 10 MG RE SUPP
10.0000 mg | Freq: Every day | RECTAL | Status: DC | PRN
Start: 2015-08-15 — End: 2015-08-17
  Administered 2015-08-17: 10 mg via RECTAL
  Filled 2015-08-15: qty 1

## 2015-08-15 MED ORDER — DIPHENHYDRAMINE HCL 12.5 MG/5ML PO ELIX: 12.5000 mg | ORAL_SOLUTION | ORAL | Status: DC | PRN

## 2015-08-15 MED ORDER — METOCLOPRAMIDE HCL 5 MG/ML IJ SOLN: 5.0000 mg | Freq: Three times a day (TID) | INTRAMUSCULAR | Status: DC | PRN

## 2015-08-15 MED ORDER — LIDOCAINE HCL (CARDIAC) 20 MG/ML IV SOLN
INTRAVENOUS | Status: DC | PRN
  Administered 2015-08-15: 100 mg via INTRAVENOUS

## 2015-08-15 MED ORDER — PROPOFOL 10 MG/ML IV BOLUS
INTRAVENOUS | Status: DC | PRN
  Administered 2015-08-15: 120 mg via INTRAVENOUS

## 2015-08-15 MED ORDER — ACETAMINOPHEN 325 MG PO TABS
650.0000 mg | ORAL_TABLET | Freq: Four times a day (QID) | ORAL | Status: DC | PRN
  Administered 2015-08-16 – 2015-08-17 (×2): 650 mg via ORAL
  Filled 2015-08-15 (×3): qty 2

## 2015-08-15 MED ORDER — NEOMYCIN-POLYMYXIN B GU 40-200000 IR SOLN
Status: DC | PRN
  Administered 2015-08-15: 2 mL

## 2015-08-15 MED ORDER — DEXAMETHASONE SODIUM PHOSPHATE 10 MG/ML IJ SOLN
INTRAMUSCULAR | Status: DC | PRN
  Administered 2015-08-15: 10 mg via INTRAVENOUS

## 2015-08-15 MED ORDER — TOPIRAMATE 25 MG PO TABS
50.0000 mg | ORAL_TABLET | Freq: Every day | ORAL | Status: DC
Start: 2015-08-15 — End: 2015-08-17
  Administered 2015-08-16: 50 mg via ORAL
  Filled 2015-08-15 (×3): qty 2

## 2015-08-15 MED ORDER — SCOPOLAMINE 1 MG/3DAYS TD PT72
1.0000 | MEDICATED_PATCH | Freq: Once | TRANSDERMAL | Status: DC
  Administered 2015-08-15: 1.5 mg via TRANSDERMAL
  Filled 2015-08-15: qty 1

## 2015-08-15 MED ORDER — FISH OIL + D3 1200-1000 MG-UNIT PO CAPS: 1.0000 | ORAL_CAPSULE | Freq: Every day | ORAL | Status: DC

## 2015-08-15 MED ORDER — CALCIUM POLYCARBOPHIL 625 MG PO TABS
625.0000 mg | ORAL_TABLET | Freq: Every day | ORAL | Status: DC
  Administered 2015-08-16 – 2015-08-17 (×2): 625 mg via ORAL
  Filled 2015-08-15 (×2): qty 1

## 2015-08-15 MED ORDER — OMEGA-3-ACID ETHYL ESTERS 1 G PO CAPS
1.0000 g | ORAL_CAPSULE | Freq: Every day | ORAL | Status: DC
  Administered 2015-08-16 – 2015-08-17 (×2): 1 g via ORAL
  Filled 2015-08-15 (×2): qty 1

## 2015-08-15 MED ORDER — ONDANSETRON HCL 4 MG PO TABS: 4.0000 mg | ORAL_TABLET | Freq: Four times a day (QID) | ORAL | Status: DC | PRN

## 2015-08-15 MED ORDER — LACTATED RINGERS IV SOLN
INTRAVENOUS | Status: DC | PRN
  Administered 2015-08-15 (×2): via INTRAVENOUS

## 2015-08-15 MED ORDER — PRAVASTATIN SODIUM 40 MG PO TABS
80.0000 mg | ORAL_TABLET | Freq: Every day | ORAL | Status: DC
  Administered 2015-08-15 – 2015-08-17 (×3): 80 mg via ORAL
  Filled 2015-08-15 (×3): qty 2

## 2015-08-15 MED ORDER — FLUTICASONE PROPIONATE 50 MCG/ACT NA SUSP
1.0000 | Freq: Every day | NASAL | Status: DC
  Filled 2015-08-15: qty 16

## 2015-08-15 MED ORDER — ONDANSETRON HCL 4 MG/2ML IJ SOLN
INTRAMUSCULAR | Status: DC | PRN
  Administered 2015-08-15: 4 mg via INTRAVENOUS

## 2015-08-15 MED ORDER — ENOXAPARIN SODIUM 40 MG/0.4ML ~~LOC~~ SOLN
40.0000 mg | SUBCUTANEOUS | Status: DC
  Administered 2015-08-16 – 2015-08-17 (×2): 40 mg via SUBCUTANEOUS
  Filled 2015-08-15 (×2): qty 0.4

## 2015-08-15 MED ORDER — ONDANSETRON HCL 4 MG/2ML IJ SOLN
4.0000 mg | Freq: Four times a day (QID) | INTRAMUSCULAR | Status: DC | PRN
  Administered 2015-08-17: 4 mg via INTRAVENOUS
  Filled 2015-08-15: qty 2

## 2015-08-15 MED ORDER — ACETAMINOPHEN 650 MG RE SUPP: 650.0000 mg | Freq: Four times a day (QID) | RECTAL | Status: DC | PRN

## 2015-08-15 MED ORDER — ONDANSETRON HCL 4 MG/2ML IJ SOLN
4.0000 mg | Freq: Once | INTRAMUSCULAR | Status: DC | PRN
Start: 2015-08-15 — End: 2015-08-15

## 2015-08-15 MED ORDER — KCL IN DEXTROSE-NACL 20-5-0.9 MEQ/L-%-% IV SOLN
INTRAVENOUS | Status: DC
  Administered 2015-08-15 – 2015-08-16 (×2): via INTRAVENOUS
  Filled 2015-08-15 (×7): qty 1000

## 2015-08-15 SURGICAL SUPPLY — 59 items
BANDAGE ELASTIC 6 LF NS (GAUZE/BANDAGES/DRESSINGS) ×2 IMPLANT
BIT DRILL 2.5X110 QC LCP DISP (BIT) ×2 IMPLANT
BLADE SURG SZ10 CARB STEEL (BLADE) ×4 IMPLANT
BNDG CMPR MED 5X6 ELC HKLP NS (GAUZE/BANDAGES/DRESSINGS) ×1
BNDG COHESIVE 4X5 TAN STRL (GAUZE/BANDAGES/DRESSINGS) ×2 IMPLANT
BNDG ESMARK 6X12 TAN STRL LF (GAUZE/BANDAGES/DRESSINGS) ×2 IMPLANT
BRACE KNEE POST OP SHORT (BRACE) ×1 IMPLANT
CABLE PIN IMPLANT 35 (Cable) ×1 IMPLANT
CANISTER SUCT 1200ML W/VALVE (MISCELLANEOUS) ×2 IMPLANT
CHLORAPREP W/TINT 26ML (MISCELLANEOUS) ×6 IMPLANT
CUFF TOURN 24 STER (MISCELLANEOUS) ×1 IMPLANT
CUFF TOURN 30 STER DUAL PORT (MISCELLANEOUS) IMPLANT
DECANTER SPIKE VIAL GLASS SM (MISCELLANEOUS) ×1 IMPLANT
DRAPE C-ARM XRAY 36X54 (DRAPES) ×2 IMPLANT
DRAPE C-ARMOR (DRAPES) ×2 IMPLANT
DRAPE INCISE IOBAN 66X45 STRL (DRAPES) ×2 IMPLANT
DRAPE U-SHAPE 47X51 STRL (DRAPES) ×2 IMPLANT
DRSG OPSITE POSTOP 4X8 (GAUZE/BANDAGES/DRESSINGS) ×1 IMPLANT
ELECT CAUTERY BLADE 6.4 (BLADE) ×2 IMPLANT
ELECT REM PT RETURN 9FT ADLT (ELECTROSURGICAL) ×2
ELECTRODE REM PT RTRN 9FT ADLT (ELECTROSURGICAL) ×1 IMPLANT
GAUZE PETRO XEROFOAM 1X8 (MISCELLANEOUS) ×2 IMPLANT
GAUZE SPONGE 4X4 12PLY STRL (GAUZE/BANDAGES/DRESSINGS) ×2 IMPLANT
GLOVE BIO SURGEON STRL SZ8 (GLOVE) ×2 IMPLANT
GLOVE INDICATOR 8.0 STRL GRN (GLOVE) ×2 IMPLANT
GOWN SRG 2XL LVL 4 RGLN SLV (GOWNS) ×1 IMPLANT
GOWN STRL NON-REIN 2XL LVL4 (GOWNS) ×2
GOWN STRL REUS W/ TWL LRG LVL3 (GOWN DISPOSABLE) ×1 IMPLANT
GOWN STRL REUS W/TWL LRG LVL3 (GOWN DISPOSABLE) ×2
HANDLE YANKAUER SUCT BULB TIP (MISCELLANEOUS) ×2 IMPLANT
HEMOVAC 400CC 10FR (MISCELLANEOUS) ×1 IMPLANT
IMMOB KNEE 24 THIGH 24 443303 (SOFTGOODS) ×1 IMPLANT
K-WIRE .062 (WIRE)
K-WIRE FX6X.062X2 END TROC (WIRE)
KIT RM TURNOVER STRD PROC AR (KITS) ×2 IMPLANT
KWIRE FX6X.062X2 END TROC (WIRE) ×1 IMPLANT
NDL FILTER BLUNT 18X1 1/2 (NEEDLE) ×1 IMPLANT
NEEDLE FILTER BLUNT 18X 1/2SAF (NEEDLE) ×1
NEEDLE FILTER BLUNT 18X1 1/2 (NEEDLE) ×1 IMPLANT
NS IRRIG 1000ML POUR BTL (IV SOLUTION) ×1 IMPLANT
NS IRRIG 500ML POUR BTL (IV SOLUTION) ×2 IMPLANT
PACK EXTREMITY ARMC (MISCELLANEOUS) ×2 IMPLANT
PAD ABD DERMACEA PRESS 5X9 (GAUZE/BANDAGES/DRESSINGS) ×4 IMPLANT
PAD CAST CTTN 4X4 STRL (SOFTGOODS) ×3 IMPLANT
PADDING CAST COTTON 4X4 STRL (SOFTGOODS) ×6
POSITIONER TRIANGLE 14IN (MISCELLANEOUS) ×1 IMPLANT
SPONGE LAP 18X18 5 PK (GAUZE/BANDAGES/DRESSINGS) ×1 IMPLANT
STAPLER SKIN PROX 35W (STAPLE) ×2 IMPLANT
STOCKINETTE M/LG 89821 (MISCELLANEOUS) ×2 IMPLANT
SUT FIBERWIRE #5 38 CONV BLUE (SUTURE)
SUT ORTHOCORD W/MULTIPK NDL (SUTURE) ×1 IMPLANT
SUT STEEL 7 (SUTURE) ×1 IMPLANT
SUT VIC AB 0 CT1 27 (SUTURE) ×2
SUT VIC AB 0 CT1 27XCR 8 STRN (SUTURE) ×1 IMPLANT
SUT VIC AB 0 CT1 36 (SUTURE) ×1 IMPLANT
SUT VIC AB 2-0 CT1 27 (SUTURE) ×2
SUT VIC AB 2-0 CT1 TAPERPNT 27 (SUTURE) ×1 IMPLANT
SUTURE FIBERWR #5 38 CONV BLUE (SUTURE) ×1 IMPLANT
SYRINGE 10CC LL (SYRINGE) ×2 IMPLANT

## 2015-08-15 NOTE — Op Note (Signed)
08/14/2015 - 08/15/2015  12:26 PM  Patient:   Theresa Hall  Pre-Op Diagnosis:   Comminuted displaced right patella fracture.  Post-Op Diagnosis:   Same.  Procedure:   Open reduction and internal fixation of comminuted right patella fracture.  Surgeon:   Pascal Lux, MD  Assistant:   None  Anesthesia:   General LMA  Findings:   As above.  Complications:   None  EBL:   10 cc  Fluids:   1000 cc crystalloid  TT:   69 minutes at 300 mmHg  Drains:   None  Closure:   Staples  Implants:   Biomet 35 mm tension band screw and cable system  Brief Clinical Note:   The patient is a 75 year old female who sustained the above-noted injury yesterday evening. Apparently, she tripped and fell onto a hard floor, landing directly onto her flexed right knee. She was unable to get up and so was brought to the emergency room where x-rays demonstrated the above-noted injury. She presents at this time for definitive management of this injury.  Procedure:   The patient was brought into the operating room and lain in the supine position. After adequate general laryngeal mask anesthesia was obtained, the patient's right lower extremity was prepped with ChloraPrep solution before being draped sterilely. Preoperative antibiotics were administered. A timeout was performed to verify the appropriate surgical site before the limb was exsanguinated with an Esmarch and the tourniquet inflated to 300 mmHg. An approximately 4-5 inch incision was made over the anterior aspect of the knee, centered over the patella. The incision was carried down through the subcutaneous tissues to expose the superficial retinaculum. This was split the length of the incision and the medial and lateral flaps elevated sufficiently to expose the patella. The fracture fragments were carefully reduced and stabilized using bone holding forceps. The adequacy of fracture reduction was verified fluoroscopically in AP and lateral projections  and found to be satisfactory. Two 2.5 mm drill bits were drilled from distal to proximal under fluoroscopic visualization. Once their position was verified fluoroscopically, each drill bit was removed and replaced with a 35 mm partially threaded cancellus screw with a cable attached. Again the adequacy of screw position was verified fluoroscopically in AP and lateral projections and found to be excellent. A horizontal hole was drilled through the proximal pole of the patella and one of the cables passed through this from medial to lateral. The cables were then tightened and secured in a figure-of-eight tension band configuration using the appropriate prep and crimping device. Again, the adequacy of fracture reduction and hardware position was verified fluoroscopically in AP and lateral projections and found to be satisfactory. The construct was stable to gentle knee flexion to 75.  The wound was copiously irrigated with sterile saline solution before the superficial retinacular layer was reapproximated using #0 Vicryl interrupted sutures. The subcutaneous tissues were closed in two layers using 2-0 Vicryl interrupted sutures before the skin was closed using staples. A total of 20 cc of 0.5% Sensorcaine with epinephrine was injected in and around the incision site to help with postoperative analgesia before a sterile occlusive dressing was applied to the knee. An Ace wrap was placed around the knee to accommodate a Polar Care pack before the patient was placed into a hinged knee brace with the hinges locked in extension brace set at 0-70. The patient was then awakened, extubated, and returned to the recovery room in satisfactory condition after tolerating the procedure well.

## 2015-08-15 NOTE — Anesthesia Postprocedure Evaluation (Signed)
Anesthesia Post Note  Patient: Theresa Hall  Procedure(s) Performed: Procedure(s) (LRB): OPEN REDUCTION INTERNAL (ORIF) FIXATION PATELLA  and application wound vac (Right)  Patient location during evaluation: PACU Anesthesia Type: General Level of consciousness: awake and alert Pain management: pain level controlled Vital Signs Assessment: post-procedure vital signs reviewed and stable Respiratory status: spontaneous breathing, nonlabored ventilation, respiratory function stable and patient connected to nasal cannula oxygen Cardiovascular status: blood pressure returned to baseline and stable Postop Assessment: no signs of nausea or vomiting Anesthetic complications: no    Last Vitals:  Vitals:   08/15/15 1204 08/15/15 1256  BP:    Pulse:    Resp:    Temp: 36.2 C 36.2 C    Last Pain:  Vitals:   08/15/15 1204  TempSrc:   PainSc: Asleep                 Martha Clan

## 2015-08-15 NOTE — Care Management Obs Status (Signed)
[facility] NOTIFICATION   Patient Details  Name: Theresa Hall MRN: [id number] Date of Birth: 09-21-1940   Medicare Observation Status Notification Given:  Yes    Ival Bible, RN 08/15/2015, 7:57 PM

## 2015-08-15 NOTE — Anesthesia Preprocedure Evaluation (Signed)
Anesthesia Evaluation  Patient identified by MRN, date of birth, ID band Patient awake    Reviewed: Allergy & Precautions, H&P , NPO status , Patient's Chart, lab work & pertinent test results, reviewed documented beta blocker date and time   History of Anesthesia Complications (+) PONV and history of anesthetic complications  Airway Mallampati: IV  TM Distance: >3 FB Neck ROM: full    Dental no notable dental hx. (+) Caps, Teeth Intact   Pulmonary neg pulmonary ROS,    Pulmonary exam normal breath sounds clear to auscultation       Cardiovascular Exercise Tolerance: Good negative cardio ROS Normal cardiovascular exam Rhythm:regular Rate:Normal     Neuro/Psych  Headaches, neg Seizures negative psych ROS   GI/Hepatic Neg liver ROS, GERD  Medicated,  Endo/Other  neg diabetesHypothyroidism   Renal/GU CRFRenal disease  negative genitourinary   Musculoskeletal   Abdominal   Peds  Hematology negative hematology ROS (+)   Anesthesia Other Findings Past Medical History: No date: Allergic rhinitis, cause unspecified 10/2011: Cancer ([place])     Comment: basal 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 No date: Chronic kidney disease, stage I No date: Chronic kidney disease, stage III (moderate) No date: Diverticulitis of colon (without mention of he* No date: Migraine without aura, without mention of intr* No date: Osteoarthritis No date: Osteopenia No date: Other abnormal glucose No date: Other and unspecified hyperlipidemia No date: Postablative ovarian failure No date: Pure hypercholesterolemia No date: Unspecified glaucoma No date: Unspecified hypothyroidism   Reproductive/Obstetrics negative OB ROS                             Anesthesia Physical Anesthesia Plan  ASA: III  Anesthesia Plan: General   Post-op Pain Management:    Induction:   Airway Management Planned:   Additional Equipment:    Intra-op Plan:   Post-operative Plan:   Informed Consent: I have reviewed the patients History and Physical, chart, labs and discussed the procedure including the risks, benefits and alternatives for the proposed anesthesia with the patient or authorized representative who has indicated his/her understanding and acceptance.   Dental Advisory Given  Plan Discussed with: Anesthesiologist, CRNA and Surgeon  Anesthesia Plan Comments:         Anesthesia Quick Evaluation

## 2015-08-15 NOTE — Care Management Important Message (Signed)
Important Message  Patient Details  Name: Theresa Hall MRN: [id number] Date of Birth: 02/06/1940   Medicare Important Message Given:  Yes    Tishana Clinkenbeard A, RN 08/15/2015, 2:16 PM

## 2015-08-15 NOTE — Clinical Social Work Note (Signed)
CSW received consult for facility placement. Will follow once pt makes recommendation.  [place] MSW, [place]

## 2015-08-15 NOTE — Anesthesia Procedure Notes (Addendum)
Procedure Name: LMA Insertion Date/Time: 08/15/2015 10:19 AM Performed by: Clinton Sawyer Pre-anesthesia Checklist: Patient identified, Emergency Drugs available, Suction available, Patient being monitored and Timeout performed Patient Re-evaluated:Patient Re-evaluated prior to inductionOxygen Delivery Method: Circle system utilized Preoxygenation: Pre-oxygenation with 100% oxygen Intubation Type: IV induction Number of attempts: 1 Placement Confirmation: positive ETCO2,  CO2 detector and breath sounds checked- equal and bilateral Tube secured with: Tape Dental Injury: Teeth and Oropharynx as per pre-operative assessment

## 2015-08-15 NOTE — Care Management CC44 (Signed)
Condition Code 44 Documentation Completed  Patient Details  Name: ATIYAH HIGGENS MRN: [id number] Date of Birth: 24-Jul-1940   Condition Code 77 given:  Yes (reviewed with Dr Doy Hutching and Dr Roland Rack) Patient signature on Condition Code 44 notice:  Yes Documentation of 2 MD's agreement:  Yes Code 44 added to claim:  Yes    Ival Bible, RN 08/15/2015, 7:57 PM

## 2015-08-15 NOTE — Transfer of Care (Signed)
Immediate Anesthesia Transfer of Care Note  Patient: Theresa Hall  Procedure(s) Performed: Procedure(s): OPEN REDUCTION INTERNAL (ORIF) FIXATION PATELLA  and application wound vac (Right)  Patient Location: PACU  Anesthesia Type:General  Level of Consciousness: sedated  Airway & Oxygen Therapy: Patient Spontanous Breathing and Patient connected to nasal cannula oxygen  Post-op Assessment: Report given to RN and Post -op Vital signs reviewed and stable  Post vital signs: Reviewed and stable  Last Vitals:  Vitals:   08/15/15 0400 08/15/15 0740  BP: 137/67 (!) 159/62  Pulse: 64 65  Resp: 18   Temp: 36.9 C 36.8 C    Last Pain:  Vitals:   08/15/15 0800  TempSrc:   PainSc: 6       Patients Stated Pain Goal: 2 ([id number])  Complications: No apparent anesthesia complications

## 2015-08-16 LAB — BASIC METABOLIC PANEL
ANION GAP: 7 (ref 5–15)
BUN: 12 mg/dL (ref 6–20)
CALCIUM: 8.9 mg/dL (ref 8.9–10.3)
CHLORIDE: 110 mmol/L (ref 101–111)
CO2: 23 mmol/L (ref 22–32)
Creatinine, Ser: 0.95 mg/dL (ref 0.44–1.00)
GFR calc non Af Amer: 57 mL/min — ABNORMAL LOW (ref >60)
Glucose, Bld: 134 mg/dL — ABNORMAL HIGH (ref 65–99)
Potassium: 3.6 mmol/L (ref 3.5–5.1)
Sodium: 140 mmol/L (ref 135–145)

## 2015-08-16 MED ORDER — OXYCODONE HCL 5 MG PO TABS: 5.0000 mg | ORAL_TABLET | ORAL | 0 refills | Status: DC | PRN

## 2015-08-16 MED ORDER — HYDROCODONE-ACETAMINOPHEN 5-325 MG PO TABS
1.0000 | ORAL_TABLET | ORAL | Status: DC | PRN
  Administered 2015-08-17: 2 via ORAL
  Administered 2015-08-17: 1 via ORAL
  Administered 2015-08-17: 2 via ORAL
  Filled 2015-08-16: qty 1
  Filled 2015-08-16 (×2): qty 2

## 2015-08-16 NOTE — Progress Notes (Signed)
Subjective: 1 Day Post-Op Procedure(s) (LRB): OPEN REDUCTION INTERNAL (ORIF) FIXATION PATELLA  and application wound vac (Right) Patient reports pain as mild.   Patient is well, and has had no acute complaints or problems Plan is to go Skilled nursing facility after hospital stay. Negative for chest pain and shortness of breath Fever: no Gastrointestinal:Negative for nausea and vomiting  Objective: Vital signs in last 24 hours: Temp:  [97.2 F (36.2 C)-99.2 F (37.3 C)] 98.4 F (36.9 C) (08/06 0753) Pulse Rate:  [48-83] 48 (08/06 0753) Resp:  [17-19] 18 (08/06 0753) BP: (127-158)/(48-77) 128/48 (08/06 0753) SpO2:  [98 %-100 %] 100 % (08/06 0412)  Intake/Output from previous day:  Intake/Output Summary (Last 24 hours) at 08/16/15 0904 Last data filed at 08/16/15 0437  Gross per 24 hour  Intake           2747.5 ml  Output             3835 ml  Net          -1087.5 ml    Intake/Output this shift: No intake/output data recorded.  Labs:  Recent Labs  08/14/15 2148  HGB 13.8    Recent Labs  08/14/15 2148  WBC 6.4  RBC 4.35  HCT 39.4  PLT 188    Recent Labs  08/14/15 2148 08/16/15 0451  NA 140 140  K 3.8 3.6  CL 109 110  CO2 23 23  BUN 20 12  CREATININE 0.97 0.95  GLUCOSE 96 134*  CALCIUM 9.5 8.9   No results for input(s): LABPT, INR in the last 72 hours.   EXAM General - Patient is Alert, Appropriate and Oriented Extremity - ABD soft Neurovascular intact Intact pulses distally Dorsiflexion/Plantar flexion intact Incision: scant drainage No cellulitis present Dressing/Incision - blood tinged drainage Motor Function - intact, moving foot and toes well on exam.   Negative Homans to bilateral legs. Pt able to dorsiflex and plantar flex ankle without pain.  Past Medical History:  Diagnosis Date  . Allergic rhinitis, cause unspecified   . Cancer ([place]) 10/2011   basal 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  . Chronic kidney disease, stage I   . Chronic kidney  disease, stage III (moderate)   . Diverticulitis of colon (without mention of hemorrhage)   . Migraine without aura, without mention of intractable migraine without mention of status migrainosus   . Osteoarthritis   . Osteopenia   . Other abnormal glucose   . Other and unspecified hyperlipidemia   . Postablative ovarian failure   . Pure hypercholesterolemia   . Unspecified glaucoma   . Unspecified hypothyroidism     Assessment/Plan: 1 Day Post-Op Procedure(s) (LRB): OPEN REDUCTION INTERNAL (ORIF) FIXATION PATELLA  and application wound vac (Right) Active Problems:   Right patella fracture   Patella fracture  Estimated body mass index is 26.79 kg/m as calculated from the following:   Height as of this encounter: 5\' 5"  (1.651 m).   Weight as of this encounter: 73 kg (161 lb). Advance diet Up with therapy   Pulse 48 and BP 128/48 this AM, will re-check.  Will keep IVF going for now. Labs reviewed and stable.  CBC and BMP ordered for tomorrow morning. Pt is drinking and peeing without difficulty this AM. Pt has daughters that live out of town, pt would prefer to go to rehab if possible, PT to evaluate today.  DVT Prophylaxis - Lovenox, Foot Pumps and TED hose Weight-Bearing as tolerated to right leg with knee locked  in extension.  Raquel James, PA-C [facility] 08/16/2015, 9:04 AM

## 2015-08-16 NOTE — Evaluation (Signed)
Physical Therapy Evaluation Patient Details Name: Theresa Hall MRN: [id number] DOB: December 25, 1940 Today's Date: 08/16/2015   History of Present Illness  75 y/o female who fell and fractured her R patella needing surgical repair.   Clinical Impression  Pt is pleasant and motivated t/o PT exam but does display some confusion, poor safety awareness and general off task distraction.  She is able to show good effort with mobility and transfers and does not need excessive assist but again is hard to keep on task and focused and she had multiple LOBs during ambulation as she was distracted.  Pt shows good effort but overall was limited and shows some safety issues along with expected weakness, ROM and mobility status.    Follow Up Recommendations SNF    Equipment Recommendations  Rolling walker with 5" wheels    Recommendations for Other Services       Precautions / Restrictions Precautions Precautions: Fall;Knee Required Braces or Orthoses: Knee Immobilizer - Right Knee Immobilizer - Right: On at all times Restrictions Weight Bearing Restrictions: Yes RLE Weight Bearing: Weight bearing as tolerated      Mobility  Bed Mobility Overal bed mobility: Needs Assistance Bed Mobility: Supine to Sit     Supine to sit: Min assist     General bed mobility comments: Pt able to assist with sliding LEs to EOB and trying to lift torso, but she did need assist to get fully upright  Transfers Overall transfer level: Needs assistance Equipment used: Rolling walker (2 wheeled) Transfers: Sit to/from Stand Sit to Stand: Min assist;Mod assist         General transfer comment: Pt shows good effort with getting L LE and UEs in place to aide with rising to standing and overall does not need excessive aassist to get to standing but needs help initiating elevation as well as keeping weight forward  Ambulation/Gait Ambulation/Gait assistance: Mod assist Ambulation Distance (Feet): 35  Feet Assistive device: Rolling walker (2 wheeled)       General Gait Details: Pt is able to ambulate in the room with walker and KI and though she was slow and cautious she generally did well.  However she had a few episodes where she did lose concentration and subsequently her balance and needed mod assist just to maintain upright.   Stairs            Wheelchair Mobility    Modified Rankin (Stroke Patients Only)       Balance                                             Pertinent Vitals/Pain Pain Assessment: 0-10 Pain Score: 4  Pain Location: R knee    Home Living Family/patient expects to be discharged to:: Skilled nursing facility Living Arrangements: Alone                    Prior Function Level of Independence: Independent         Comments: Pt was able to to all she needed, driving, running errands, etc     Hand Dominance        Extremity/Trunk Assessment   Upper Extremity Assessment: Overall WFL for tasks assessed;Generalized weakness           Lower Extremity Assessment: Generalized weakness (minimal AROM on R, L LE WFL)  Communication   Communication: No difficulties (pt with scattered, rambling sentences t/o the session)  Cognition Arousal/Alertness: Awake/alert Behavior During Therapy: Impulsive Overall Cognitive Status: Difficult to assess (pt able to answer questions, but is often off topic)                      General Comments      Exercises General Exercises - Lower Extremity Ankle Circles/Pumps: AROM;10 reps Quad Sets: Strengthening;10 reps Gluteal Sets: Strengthening;10 reps Hip ABduction/ADduction: AROM;10 reps Straight Leg Raises: PROM;5 reps      Assessment/Plan    PT Assessment Patient needs continued PT services  PT Diagnosis Acute pain;Generalized weakness;Difficulty walking   PT Problem List Decreased strength;Decreased range of motion;Decreased activity  tolerance;Decreased balance;Decreased mobility;Decreased coordination;Decreased cognition;Decreased knowledge of use of DME;Decreased safety awareness;Pain  PT Treatment Interventions DME instruction;Gait training;Functional mobility training;Therapeutic activities;Therapeutic exercise;Balance training;Patient/family education;Cognitive remediation   PT Goals (Current goals can be found in the Care Plan section) Acute Rehab PT Goals Patient Stated Goal: get back home PT Goal Formulation: With patient Time For Goal Achievement: 08/30/15 Potential to Achieve Goals: Fair    Frequency BID   Barriers to discharge        Co-evaluation               End of Session Equipment Utilized During Treatment: Gait belt Activity Tolerance: Patient tolerated treatment well;Patient limited by fatigue Patient left: with chair alarm set;with call bell/phone within reach      Functional Assessment Tool Used: clinical judgement Functional Limitation: Mobility: Walking and moving around Mobility: Walking and Moving Around Current Status [id number]): At least 40 percent but less than 60 percent impaired, limited or restricted Mobility: Walking and Moving Around Goal Status [phone]): At least 1 percent but less than 20 percent impaired, limited or restricted    Time: 0835-0910 PT Time Calculation (min) (ACUTE ONLY): 35 min   Charges:   PT Evaluation $PT Eval Low Complexity: 1 Procedure PT Treatments $Therapeutic Exercise: 8-22 mins   PT G Codes:   PT G-Codes **NOT FOR INPATIENT CLASS** Functional Assessment Tool Used: clinical judgement Functional Limitation: Mobility: Walking and moving around Mobility: Walking and Moving Around Current Status [id number]): At least 40 percent but less than 60 percent impaired, limited or restricted Mobility: Walking and Moving Around Goal Status [phone]): At least 1 percent but less than 20 percent impaired, limited or restricted    Kreg Shropshire, DPT 08/16/2015,  1:15 PM

## 2015-08-16 NOTE — NC FL2 (Signed)
[place] LEVEL OF CARE SCREENING TOOL     IDENTIFICATION  Patient Name: Theresa Hall Birthdate: [birth year] Sex: female Admission Date (Current Location): 08/14/2015  [place] and [place] Number:  Engineering geologist and Address:  [facility], [address] [place], [place] [address]      Provider Number: [id number]  Attending Physician Name and Address:  Corky Mull, MD  Relative Name and Phone Number:       Current Level of Care: Hospital Recommended Level of Care: [place] Prior Approval Number:    Date Approved/Denied: 08/16/15 PASRR Number: [id number]  Discharge Plan: SNF    Current Diagnoses: Patient Active Problem List   Diagnosis Date Noted  . Patella fracture 08/15/2015  . Right patella fracture 08/14/2015  . Reflux esophagitis 06/23/2014  . HLD (hyperlipidemia) 06/23/2014  . Palpitations 06/23/2014  . Chronic kidney disease (CKD), stage III (moderate) 08/24/2009  . Diverticulitis of colon 05/14/2008  . Adult hypothyroidism 12/04/2006  . Migraine without aura 08/02/2006  . 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    Orientation RESPIRATION BLADDER Height & Weight     Self, Time, Situation, Place  Normal Continent Weight: 161 lb (73 kg) Height:  5\' 5"  (165.1 cm)  BEHAVIORAL SYMPTOMS/MOOD NEUROLOGICAL BOWEL NUTRITION STATUS      Continent    AMBULATORY STATUS COMMUNICATION OF NEEDS Skin   Extensive Assist Verbally Normal                       Personal Care Assistance Level of Assistance  Bathing, Feeding, Dressing Bathing Assistance: Limited assistance Feeding assistance: Independent Dressing Assistance: Limited assistance     Functional Limitations Info             SPECIAL CARE FACTORS FREQUENCY                       Contractures Contractures Info: Present    Additional Factors Info                  Current Medications (08/16/2015):  This is the current  hospital active medication list Current Facility-Administered Medications  Medication Dose Route Frequency Provider Last Rate Last Dose  . acetaminophen (TYLENOL) tablet 650 mg  650 mg Oral Q6H PRN Corky Mull, MD   650 mg at 08/16/15 1644   Or  . acetaminophen (TYLENOL) suppository 650 mg  650 mg Rectal Q6H PRN Corky Mull, MD      . aspirin chewable tablet 81 mg  81 mg Oral Daily Corky Mull, MD   81 mg at 08/16/15 0940  . bisacodyl (DULCOLAX) suppository 10 mg  10 mg Rectal Daily PRN Corky Mull, MD      . dextrose 5 % and 0.9 % NaCl with KCl 20 mEq/L infusion   Intravenous Continuous Corky Mull, MD 75 mL/hr at 08/15/15 1527    . diphenhydrAMINE (BENADRYL) 12.5 MG/5ML elixir 12.5-25 mg  12.5-25 mg Oral Q4H PRN Corky Mull, MD      . docusate sodium (COLACE) capsule 100 mg  100 mg Oral BID Corky Mull, MD   100 mg at 08/16/15 0940  . enoxaparin (LOVENOX) injection 40 mg  40 mg Subcutaneous Q24H Corky Mull, MD   40 mg at 08/16/15 0939  . fluticasone (FLONASE) 50 MCG/ACT nasal spray 1 spray  1 spray Each Nare Daily Corky Mull, MD      .  HYDROcodone-acetaminophen (NORCO/VICODIN) 5-325 MG per tablet 1-2 tablet  1-2 tablet Oral Q3H PRN Lattie Corns, PA-C      . HYDROmorphone (DILAUDID) injection 0.5-1 mg  0.5-1 mg Intravenous Q2H PRN Corky Mull, MD   1 mg at 08/16/15 0057  . latanoprost (XALATAN) 0.005 % ophthalmic solution 1 drop  1 drop Both Eyes QHS Corky Mull, MD   1 drop at 08/15/15 2111  . levothyroxine (SYNTHROID, LEVOTHROID) tablet 88 mcg  88 mcg Oral QAC breakfast Corky Mull, MD   88 mcg at 08/16/15 0941  . magnesium hydroxide (MILK OF MAGNESIA) suspension 30 mL  30 mL Oral Daily PRN Corky Mull, MD      . metoCLOPramide (REGLAN) tablet 5-10 mg  5-10 mg Oral Q8H PRN Corky Mull, MD       Or  . metoCLOPramide (REGLAN) injection 5-10 mg  5-10 mg Intravenous Q8H PRN Corky Mull, MD      . multivitamin with minerals tablet 1 tablet  1 tablet Oral Daily Corky Mull,  MD   1 tablet at 08/16/15 0940  . omega-3 acid ethyl esters (LOVAZA) capsule 1 g  1 g Oral Daily Sheema M Hallaji, RPH   1 g at 08/16/15 0940  . ondansetron (ZOFRAN) tablet 4 mg  4 mg Oral Q6H PRN Corky Mull, MD       Or  . ondansetron [facility]) injection 4 mg  4 mg Intravenous Q6H PRN Corky Mull, MD      . pantoprazole (PROTONIX) EC tablet 40 mg  40 mg Oral QAC breakfast Corky Mull, MD   40 mg at 08/16/15 0940  . polycarbophil (FIBERCON) tablet 625 mg  625 mg Oral Daily Corky Mull, MD   625 mg at 08/16/15 0941  . pravastatin (PRAVACHOL) tablet 80 mg  80 mg Oral Daily Corky Mull, MD   80 mg at 08/15/15 2111  . scopolamine (TRANSDERM-SCOP) 1 MG/3DAYS 1.5 mg  1 patch Transdermal Once Martha Clan, MD   1.5 mg at 08/15/15 1526  . sodium phosphate (FLEET) 7-19 GM/118ML enema 1 enema  1 enema Rectal Once PRN Corky Mull, MD      . topiramate (TOPAMAX) tablet 50 mg  50 mg Oral Daily Corky Mull, MD   50 mg at 08/16/15 0940     Discharge Medications: Please see discharge summary for a list of discharge medications.  Relevant Imaging Results:  Relevant Lab Results:   Additional Information    Zettie Pho, LCSW

## 2015-08-16 NOTE — Clinical Social Work Note (Signed)
Clinical Social Work Assessment  Patient Details  Name: Theresa Hall MRN: [id number] Date of Birth: [birth year]  Date of referral:  08/16/15               Reason for consult:  Facility Placement                Permission sought to share information with:  Family Supports Permission granted to share information::  Yes, Verbal Permission Granted  Name::     Daughters Santiago Glad and Physicist, medical::     Relationship::     Contact Information:     Housing/Transportation Living arrangements for the past 2 months:  [place] of Information:  Patient, Adult Children Patient Interpreter Needed:  None Criminal Activity/Legal Involvement Pertinent to Current Situation/Hospitalization:  No - Comment as needed Significant Relationships:  Adult Children, [place] Lives with:  Self Do you feel safe going back to the place where you live?  Yes Need for family participation in patient care:  No (Coment)  Care giving concerns:  SNF placement   Social Worker assessment / plan:  Patient is alert and oriented x4, but fluctuates due to medication. Patient verbally agreed to having her daughters discuss care with the CSW as well as for referral information to be sent out.  Patient is baseline vivacious and very social. Patient has a strong connection to her church and is high functioning and independent. Patient lives alone in a single family home and has very good family support from her two daughters, [name] and Santiago Glad. [name] lives in [place] and handles the bulk of Copy and insurance information, while Santiago Glad is a hospice nurse in [place] and handles the more medical side of her mother's care, according to both the patient and the family members.   PT is recommending SNF with PT for rehab. Patient and her family prefer [facility], and [facility] (in that order). Insurance auth not started by CSW as no offers at this time.  Patient baseline uses no DME and is fully  independent in all ADL and IADL functions.  Employment status:  Retired Nurse, adult PT Recommendations:  [place] / Referral to community resources:     Patient/Family's Response to care:  Very satisfied; the family wants contact information to give kudos for their mother's care.  Patient/Family's Understanding of and Emotional Response to Diagnosis, Current Treatment, and Prognosis:  Patient's family is highly involved in her care and are able to verbalize in their own terms the diagnosis and treatment plans. Patient also able to verbalize. Family thanked the CSW for emotional support and care of the patient.  Emotional Assessment Appearance:  Appears younger than stated age Attitude/Demeanor/Rapport:   (Incredibly outgoing and pleasant/Family as well) Affect (typically observed):  Appropriate, Euphoric, Hopeful, Happy Orientation:  Oriented to Self, Oriented to Place, Oriented to  Time, Oriented to Situation (Patient sensistive to oxy effects; orientation fluctuated.) Alcohol / Substance use:  Never Used Psych involvement (Current and /or in the community):  No (Comment)  Discharge Needs  Concerns to be addressed:  Discharge Planning Concerns Readmission within the last 30 days:  No Current discharge risk:  None Barriers to Discharge:  No Barriers Identified   Zettie Pho, LCSW 08/16/2015, 4:37 PM

## 2015-08-17 ENCOUNTER — Encounter: Payer: Self-pay | Admitting: Surgery

## 2015-08-17 DIAGNOSIS — N183 Chronic kidney disease, stage 3 (moderate): Secondary | ICD-10-CM | POA: Diagnosis not present

## 2015-08-17 DIAGNOSIS — E039 Hypothyroidism, unspecified: Secondary | ICD-10-CM | POA: Diagnosis not present

## 2015-08-17 DIAGNOSIS — R569 Unspecified convulsions: Secondary | ICD-10-CM | POA: Diagnosis not present

## 2015-08-17 DIAGNOSIS — S82041A Displaced comminuted fracture of right patella, initial encounter for closed fracture: Secondary | ICD-10-CM | POA: Diagnosis not present

## 2015-08-17 DIAGNOSIS — M1990 Unspecified osteoarthritis, unspecified site: Secondary | ICD-10-CM | POA: Diagnosis not present

## 2015-08-17 DIAGNOSIS — S82041D Displaced comminuted fracture of right patella, subsequent encounter for closed fracture with routine healing: Secondary | ICD-10-CM | POA: Diagnosis not present

## 2015-08-17 DIAGNOSIS — K5900 Constipation, unspecified: Secondary | ICD-10-CM | POA: Diagnosis not present

## 2015-08-17 DIAGNOSIS — R6889 Other general symptoms and signs: Secondary | ICD-10-CM | POA: Diagnosis not present

## 2015-08-17 DIAGNOSIS — M810 Age-related osteoporosis without current pathological fracture: Secondary | ICD-10-CM | POA: Diagnosis not present

## 2015-08-17 DIAGNOSIS — Z743 Need for continuous supervision: Secondary | ICD-10-CM | POA: Diagnosis not present

## 2015-08-17 DIAGNOSIS — G43909 Migraine, unspecified, not intractable, without status migrainosus: Secondary | ICD-10-CM | POA: Diagnosis not present

## 2015-08-17 DIAGNOSIS — M8580 Other specified disorders of bone density and structure, unspecified site: Secondary | ICD-10-CM | POA: Diagnosis not present

## 2015-08-17 DIAGNOSIS — Z8781 Personal history of (healed) traumatic fracture: Secondary | ICD-10-CM | POA: Diagnosis not present

## 2015-08-17 DIAGNOSIS — Z967 Presence of other bone and tendon implants: Secondary | ICD-10-CM | POA: Diagnosis not present

## 2015-08-17 DIAGNOSIS — H409 Unspecified glaucoma: Secondary | ICD-10-CM | POA: Diagnosis not present

## 2015-08-17 DIAGNOSIS — W19XXXD Unspecified fall, subsequent encounter: Secondary | ICD-10-CM | POA: Diagnosis not present

## 2015-08-17 DIAGNOSIS — E785 Hyperlipidemia, unspecified: Secondary | ICD-10-CM | POA: Diagnosis not present

## 2015-08-17 LAB — BASIC METABOLIC PANEL
ANION GAP: 5 (ref 5–15)
BUN: 18 mg/dL (ref 6–20)
CHLORIDE: 115 mmol/L — AB (ref 101–111)
CO2: 24 mmol/L (ref 22–32)
Calcium: 8.8 mg/dL — ABNORMAL LOW (ref 8.9–10.3)
Creatinine, Ser: 1.02 mg/dL — ABNORMAL HIGH (ref 0.44–1.00)
GFR calc Af Amer: >60 mL/min (ref >60)
GFR calc non Af Amer: 52 mL/min — ABNORMAL LOW (ref >60)
Glucose, Bld: 104 mg/dL — ABNORMAL HIGH (ref 65–99)
POTASSIUM: 3.8 mmol/L (ref 3.5–5.1)
SODIUM: 144 mmol/L (ref 135–145)

## 2015-08-17 LAB — CBC
HEMATOCRIT: 33.4 % — AB (ref 35.0–47.0)
HEMOGLOBIN: 12 g/dL (ref 12.0–16.0)
MCH: 32.5 pg (ref 26.0–34.0)
MCHC: 36 g/dL (ref 32.0–36.0)
MCV: 90.4 fL (ref 80.0–100.0)
Platelets: 148 10*3/uL — ABNORMAL LOW (ref 150–440)
RBC: 3.69 MIL/uL — ABNORMAL LOW (ref 3.80–5.20)
RDW: 12.8 % (ref 11.5–14.5)
WBC: 6.8 10*3/uL (ref 3.6–11.0)

## 2015-08-17 LAB — VITAMIN B12: Vitamin B-12: 691 pg/mL (ref 180–914)

## 2015-08-17 MED ORDER — HYDROCODONE-ACETAMINOPHEN 5-325 MG PO TABS: 1.0000 | ORAL_TABLET | ORAL | 0 refills | Status: DC | PRN

## 2015-08-17 MED ORDER — MAGNESIUM CITRATE PO SOLN
1.0000 | Freq: Once | ORAL | Status: DC | PRN
  Filled 2015-08-17: qty 296

## 2015-08-17 NOTE — Progress Notes (Signed)
D/C instructions reviewed with pt, transferring to [facility] at D/C. EMS to transport. Awaiting BM, suppository given.

## 2015-08-17 NOTE — Progress Notes (Signed)
BM successful. Pt transporting to [facility] via EMS. Report called to nurse for rm 405.

## 2015-08-17 NOTE — Discharge Summary (Signed)
Physician Discharge Summary  Patient ID: CIIN OBRYAN MRN: [id number] DOB/AGE: [birth year] 75 y.o.  Admit date: 08/14/2015 Discharge date: 08/17/2015  Admission Diagnoses:  Fall, initial encounter [W19.XXXA] Patellar fracture, right, closed, initial encounter [S82.001A] Comminuted displaced right patella fracture  Discharge Diagnoses: Patient Active Problem List   Diagnosis Date Noted  . Patella fracture 08/15/2015  . Right patella fracture 08/14/2015  . Reflux esophagitis 06/23/2014  . HLD (hyperlipidemia) 06/23/2014  . Palpitations 06/23/2014  . Chronic kidney disease (CKD), stage III (moderate) 08/24/2009  . Diverticulitis of colon 05/14/2008  . Adult hypothyroidism 12/04/2006  . Migraine without aura 08/02/2006  . 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 Allergic rhinitis, cause unspecified   . Cancer ([place]) 10/2011   basal 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  . Chronic kidney disease, stage I   . Chronic kidney disease, stage III (moderate)   . Diverticulitis of colon (without mention of hemorrhage)   . Migraine without aura, without mention of intractable migraine without mention of status migrainosus   . Osteoarthritis   . Osteopenia   . Other abnormal glucose   . Other and unspecified hyperlipidemia   . Postablative ovarian failure   . Pure hypercholesterolemia   . Unspecified glaucoma   . Unspecified hypothyroidism     Transfusion: None   Consultants (if any):   Discharged Condition: Improved  Hospital Course: MARTHELLA TARBUTTON is an 75 y.o. female who was admitted 08/14/2015 with a diagnosis of a comminuted displaced right patella fracture and went to the operating room on 08/14/2015 - 08/15/2015 and underwent the above named procedures.    Surgeries: Procedure(s): OPEN REDUCTION INTERNAL (ORIF) FIXATION PATELLA  and application wound vac on 08/14/2015 - 08/15/2015 Patient tolerated the  surgery well. Taken to PACU where she was stabilized and then transferred to the orthopedic floor.  Started on Lovenox 40mg  q 24 hrs. Foot pumps applied bilaterally at 80 mm. Heels elevated on bed with rolled towels. No evidence of DVT. Negative Homan. Physical therapy started on day #1 for gait training and transfer. OT started day #1 for ADL and assisted devices.  Patient's IV was d/c on POD1  Implants: Biomet 35 mm tension band screw and cable system  She was given perioperative antibiotics:  Anti-infectives    Start     Dose/Rate Route Frequency Ordered Stop   08/15/15 1400  ceFAZolin (ANCEF) IVPB 2g/100 mL premix     2 g 200 mL/hr over 30 Minutes Intravenous Every 6 hours 08/15/15 1332 08/16/15 0127   08/14/15 2132  ceFAZolin (ANCEF) IVPB 2g/100 mL premix     2 g 200 mL/hr over 30 Minutes Intravenous 30 min pre-op 08/14/15 2133 08/15/15 1037    .  She was given sequential compression devices, early ambulation, and Lovenox for DVT prophylaxis.  She benefited maximally from the hospital stay and there were no complications.    Recent vital signs:  Vitals:   08/16/15 1942 08/17/15 0406  BP: 112/81 (!) 169/61  Pulse: 70 (!) 57  Resp: 19 19  Temp: 98.5 F (36.9 C) 98.6 F (37 C)    Recent laboratory studies:  Lab Results  Component Value Date   HGB 12.0 08/17/2015   HGB 13.8 08/14/2015   Lab Results  Component Value Date   WBC 6.8 08/17/2015   PLT 148 (L) 08/17/2015   No results found for: INR Lab Results  Component Value Date   NA  144 08/17/2015   K 3.8 08/17/2015   CL 115 (H) 08/17/2015   CO2 24 08/17/2015   BUN 18 08/17/2015   CREATININE 1.02 (H) 08/17/2015   GLUCOSE 104 (H) 08/17/2015    Discharge Medications:     Medication List    TAKE these medications   ASPIRIN LOW DOSE 81 MG chewable tablet Generic drug:  aspirin Chew 1 tablet by mouth daily.   FIBERCON PO Take by mouth daily.   FISH OIL + D3 1200-1000 MG-UNIT Caps Take 1 capsule by  mouth daily.   HYDROcodone-acetaminophen 5-325 MG tablet Commonly known as:  NORCO/VICODIN Take 1-2 tablets by mouth every 4 (four) hours as needed (breakthrough pain).   latanoprost 0.005 % ophthalmic solution Commonly known as:  XALATAN PLACE 1 DROP INTO BOTH EYES AT BEDTIME   levothyroxine 88 MCG tablet Commonly known as:  SYNTHROID Take 1 tablet (88 mcg total) by mouth daily.   mometasone 50 MCG/ACT nasal spray Commonly known as:  NASONEX Place 2 sprays into the nose daily.   multivitamin tablet Take 1 tablet by mouth daily.   pravastatin 80 MG tablet Commonly known as:  PRAVACHOL Take 1 tablet (80 mg total) by mouth daily.   ranitidine 150 MG tablet Commonly known as:  ZANTAC Take 1 tablet (150 mg total) by mouth 2 (two) times daily.   topiramate 50 MG tablet Commonly known as:  TOPAMAX Take 1 tablet (50 mg total) by mouth daily.       Diagnostic Studies: Dg Chest 1 View  Result Date: 08/14/2015 CLINICAL DATA:  Preoperative chest x-ray EXAM: CHEST 1 VIEW COMPARISON:  April 24, 2013 FINDINGS: The heart size and mediastinal contours are within normal limits. Both lungs are clear. The visualized skeletal structures are unremarkable. IMPRESSION: No active disease. Electronically Signed   By: Dorise Bullion III M.D   On: 08/14/2015 21:58   Dg Knee 1-2 Views Right  Result Date: 08/15/2015 CLINICAL DATA:  Operation EXAM: DG C-ARM 61-120 MIN; RIGHT KNEE - 1-2 VIEW COMPARISON:  Plain film of the right knee dated 08/14/2015. FINDINGS: Three intraoperative films are provided showing screw and cerclage wire fixation of the patellar fracture. Hardware appears appropriately positioned. Osseous alignment appears anatomic. IMPRESSION: Surgical fixation of the patellar fracture. No evidence of surgical complicating feature. Electronically Signed   By: Franki Cabot M.D.   On: 08/15/2015 13:16   Dg Knee Complete 4 Views Right  Result Date: 08/14/2015 CLINICAL DATA:  Slipped on a bottle  of water intra [facility] and fall tonight. Right knee pain. EXAM: RIGHT KNEE - COMPLETE 4+ VIEW COMPARISON:  Radiographs 02/16/2009 FINDINGS: Comminuted patellar fracture with at least 3 dominant patellar fragments. Dominant fracture plane is transverse were there is displacement of 4-5 mm. Associated joint effusion. No additional acute fracture. Mild degenerative change. IMPRESSION: Comminuted mildly displaced patellar fracture with associated joint effusion. Electronically Signed   By: Jeb Levering M.D.   On: 08/14/2015 20:36   Dg C-arm 1-60 Min  Result Date: 08/15/2015 CLINICAL DATA:  Operation EXAM: DG C-ARM 61-120 MIN; RIGHT KNEE - 1-2 VIEW COMPARISON:  Plain film of the right knee dated 08/14/2015. FINDINGS: Three intraoperative films are provided showing screw and cerclage wire fixation of the patellar fracture. Hardware appears appropriately positioned. Osseous alignment appears anatomic. IMPRESSION: Surgical fixation of the patellar fracture. No evidence of surgical complicating feature. Electronically Signed   By: Franki Cabot M.D.   On: 08/15/2015 13:16   Disposition: Plan will be for discharge to  SNF today following PT sessions today.  Pt is to keep knee locked in extension, Norco as needed for pain.  Continue ASA 81mg  daily for DVT prophylaxis.  Follow-up with [facility] in 10-14 days for staple removal.   Follow-up Information    Judson Roch, PA-C Follow up in 14 day(s).   Specialty:  Physician Assistant Why:  Staple removal and skin check. Contact information: [place] [address] [phone]           Signed: Judson Roch PA-C 08/17/2015, 7:59 AM

## 2015-08-17 NOTE — Discharge Instructions (Signed)
-  Weight-bear as tolerated to the ROM brace locked in extension. -Keep dressing dry, may shower with honeycomb dressing but pat dry. -Norco as needed for discomfort -Follow-up in 10-14 days with [facility].  Call if any urgent changes in health such as leg swelling or drainage from incision.

## 2015-08-17 NOTE — Progress Notes (Signed)
Clinical Education officer, museum (CSW) presented bed offers to patient and daughter [name]. They chose [facility].   Patient is medically stable for D/C to [facility] today. Per [facility] admissions coordinator at [facility] patient will go to room 405. Per Marden Noble he will start [place] authorization today and will accept a 5 day LOG. Zack CSW Surveyor, quantity approved a 5 day LOG. RN will call report to 400 hall and arrange EMS for transport. CSW sent D/C orders to [facility] via [facility] today. CSW explained to patient and daughter that if [facility] denies SNF patient will have to D/C home from [place] after 5 days. They verbalized their understanding. Please reconsult if future social work needs arise. CSW signing off.   [employer], LCSW [phone]

## 2015-08-17 NOTE — Progress Notes (Signed)
Suppository ineffective. Fleets Enema administered.

## 2015-08-17 NOTE — Progress Notes (Signed)
Physical Therapy Treatment Patient Details Name: Theresa Hall MRN: [id number] DOB: [birth year] Today's Date: 08/17/2015    History of Present Illness 75 y/o female who fell and fractured her R patella needing surgical repair.     PT Comments    Pt agrees to session.  To edge of bed with min a x 1.  Ambulated to bathroom with min a x 1 20' then continued to amb 40 out into hallway and back to chair.  No BM this am during session.  Pt with no confusion this morning and overall improved gait quality and safety.  She remains unsafe to ambulate without assist as she has irregualr step pattern and requires verbal cues for steps pattern and distance.  Participated in exercises as described below.    Follow Up Recommendations  SNF     Equipment Recommendations  Rolling walker with 5" wheels    Recommendations for Other Services       Precautions / Restrictions Precautions Precautions: Fall;Knee Required Braces or Orthoses: Knee Immobilizer - Right Knee Immobilizer - Right: On at all times Restrictions Weight Bearing Restrictions: Yes RLE Weight Bearing: Weight bearing as tolerated    Mobility  Bed Mobility                  Transfers Overall transfer level: Needs assistance Equipment used: Rolling walker (2 wheeled) Transfers: Sit to/from Stand Sit to Stand: Min assist         General transfer comment: verbal cues for hand placements and to put RLE out before sitting  Ambulation/Gait Ambulation/Gait assistance: Min assist Ambulation Distance (Feet): 40 Feet (+ 20) Assistive device: Rolling walker (2 wheeled) Gait Pattern/deviations: Step-to pattern     General Gait Details: verbal cues for pattern and step length   Stairs            Wheelchair Mobility    Modified Rankin (Stroke Patients Only)       Balance Overall balance assessment: Needs assistance Sitting-balance support: Feet supported Sitting balance-Leahy Scale: Good     Standing  balance support: Bilateral upper extremity supported Standing balance-Leahy Scale: Fair                      Cognition Arousal/Alertness: Awake/alert Behavior During Therapy: WFL for tasks assessed/performed Overall Cognitive Status: Within Functional Limits for tasks assessed                      Exercises General Exercises - Lower Extremity Ankle Circles/Pumps: AROM;10 reps Quad Sets: Strengthening;10 reps Straight Leg Raises: PROM;5 reps    General Comments        Pertinent Vitals/Pain Pain Assessment: 0-10 Pain Score: 6  Pain Location: R knee Pain Descriptors / Indicators: Constant;Throbbing Pain Intervention(s): RN gave pain meds during session;Ice applied    Home Living                      Prior Function            PT Goals (current goals can now be found in the care plan section) Acute Rehab PT Goals Patient Stated Goal: to get to rehab today Progress towards PT goals: Progressing toward goals    Frequency  BID    PT Plan Current plan remains appropriate    Co-evaluation             End of Session Equipment Utilized During Treatment: Gait belt Activity Tolerance: Patient tolerated treatment well  Patient left: with chair alarm set;with call bell/phone within reach     Time: 0920-0955 PT Time Calculation (min) (ACUTE ONLY): 35 min  Charges:  $Gait Training: 8-22 mins $Therapeutic Exercise: 8-22 mins                    G Codes:      Chesley Noon, PTA 08/17/15, 10:56 AM

## 2015-08-17 NOTE — Progress Notes (Signed)
Subjective: 2 Days Post-Op Procedure(s) (LRB): OPEN REDUCTION INTERNAL (ORIF) FIXATION PATELLA  and application wound vac (Right) Patient reports pain as moderate and throbbing.   Patient is well, and has had no acute complaints or problems Plan is to go Skilled nursing facility after hospital stay. Negative for chest pain and shortness of breath Fever: no Gastrointestinal:Negative for nausea and vomiting  Objective: Vital signs in last 24 hours: Temp:  [97.9 F (36.6 C)-98.6 F (37 C)] 98.6 F (37 C) (08/07 0406) Pulse Rate:  [48-70] 57 (08/07 0406) Resp:  [18-19] 19 (08/07 0406) BP: (112-169)/(44-81) 169/61 (08/07 0406) SpO2:  [97 %-100 %] 99 % (08/07 0406)  Intake/Output from previous day:  Intake/Output Summary (Last 24 hours) at 08/17/15 0749 Last data filed at 08/17/15 0716  Gross per 24 hour  Intake             1170 ml  Output              650 ml  Net              520 ml    Intake/Output this shift: No intake/output data recorded.  Labs:  Recent Labs  08/14/15 2148 08/17/15 0422  HGB 13.8 12.0    Recent Labs  08/14/15 2148 08/17/15 0422  WBC 6.4 6.8  RBC 4.35 3.69*  HCT 39.4 33.4*  PLT 188 148*    Recent Labs  08/16/15 0451 08/17/15 0422  NA 140 144  K 3.6 3.8  CL 110 115*  CO2 23 24  BUN 12 18  CREATININE 0.95 1.02*  GLUCOSE 134* 104*  CALCIUM 8.9 8.8*   No results for input(s): LABPT, INR in the last 72 hours.   EXAM General - Patient is Alert, Appropriate and Oriented Extremity - ABD soft Neurovascular intact Intact pulses distally Dorsiflexion/Plantar flexion intact Incision: moderate drainage No cellulitis present Dressing/Incision - blood tinged drainage Motor Function - intact, moving foot and toes well on exam.   Negative Homans to bilateral legs. Pt able to dorsiflex and plantar flex ankle without pain.  Past Medical History:  Diagnosis Date  . Allergic rhinitis, cause unspecified   . Cancer ([place]) 10/2011   basal  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  . Chronic kidney disease, stage I   . Chronic kidney disease, stage III (moderate)   . Diverticulitis of colon (without mention of hemorrhage)   . Migraine without aura, without mention of intractable migraine without mention of status migrainosus   . Osteoarthritis   . Osteopenia   . Other abnormal glucose   . Other and unspecified hyperlipidemia   . Postablative ovarian failure   . Pure hypercholesterolemia   . Unspecified glaucoma   . Unspecified hypothyroidism     Assessment/Plan: 2 Days Post-Op Procedure(s) (LRB): OPEN REDUCTION INTERNAL (ORIF) FIXATION PATELLA  and application wound vac (Right) Active Problems:   Right patella fracture   Patella fracture  Estimated body mass index is 26.79 kg/m as calculated from the following:   Height as of this encounter: 5\' 5"  (1.651 m).   Weight as of this encounter: 73 kg (161 lb). Advance diet Up with therapy   Pulse 57 and BP 169/61 this AM.  Pt eating and drinking without difficulty. Labs reviewed and stable. Pt is drinking and peeing without difficulty this AM.  Passing gas frequently, no bowel distension or pain. Plan will be for discharge to SNF today following PT. Switched pain medication to Norco due to confusion yesterday.  DVT Prophylaxis -  Lovenox, Foot Pumps and TED hose Weight-Bearing as tolerated to right leg with knee locked in extension.  Raquel Theotis Gerdeman, PA-C [facility] 08/17/2015, 7:49 AM

## 2015-08-17 NOTE — Care Management Important Message (Signed)
Important Message  Patient Details  Name: Theresa Hall MRN: [id number] Date of Birth: [birth year]   Medicare Important Message Given:  Yes    [name], RN 08/17/2015, 9:19 AM

## 2015-08-17 NOTE — Care Management Note (Signed)
Case Management Note  Patient Details  Name: Theresa Hall MRN: [id number] Date of Birth: [birth year]  Subjective/Objective:                    Action/Plan: Anticipated discharge plan is SNF CWS following for placement. Will continue to follow for changing discharge plans.   Expected Discharge Date:                  Expected Discharge Plan:  [place]  In-House Referral:     Discharge planning Services  CM Consult  Post Acute Care Choice:    Choice offered to:     DME Arranged:    DME Agency:     HH Arranged:    [place] Agency:     Status of Service:  In process, will continue to follow  If discussed at Long Length of Stay Meetings, dates discussed:    Additional Comments:  [name], RN 08/17/2015, 11:47 AM

## 2015-08-17 NOTE — Clinical Social Work Placement (Signed)
   CLINICAL SOCIAL WORK PLACEMENT  NOTE  Date:  08/17/2015  Patient Details  Name: Theresa Hall MRN: [id number] Date of Birth: 12/27/1940  Clinical Social Work is seeking post-discharge placement for this patient at the [place] level of care (*CSW will initial, date and re-position this form in  chart as items are completed):  Yes   Patient/family provided with [facility] Work Department's list of facilities offering this level of care within the geographic area requested by the patient (or if unable, by the patient's family).  Yes   Patient/family informed of their freedom to choose among providers that offer the needed level of care, that participate in Medicare, Medicaid or managed care program needed by the patient, have an available bed and are willing to accept the patient.  Yes   Patient/family informed of [facility] ownership interest in [facility] and [facility], as well as of the fact that they are under no obligation to receive care at these facilities.  PASRR submitted to EDS on 08/16/15     PASRR number received on 08/16/15     Existing PASRR number confirmed on 08/16/15     FL2 transmitted to all facilities in geographic area requested by pt/family on 08/16/15     FL2 transmitted to all facilities within larger geographic area on       Patient informed that his/her managed care company has contracts with or will negotiate with certain facilities, including the following:        Yes   Patient/family informed of bed offers received.  Patient chooses bed at  [facility] )     Physician recommends and patient chooses bed at      Patient to be transferred to  [facility] ) on 08/17/15.  Patient to be transferred to facility by  [facility] )     Patient family notified on 08/17/15 of transfer.  Name of family member notified:   (Patient's daughter [name] is at bedside and aware of D/C today. )      PHYSICIAN       Additional Comment:    _______________________________________________ Ridley Schewe, Veronia Beets, LCSW 08/17/2015, 12:40 PM

## 2015-08-17 NOTE — NC FL2 (Signed)
[place] LEVEL OF CARE SCREENING TOOL     IDENTIFICATION  Patient Name: Theresa Hall Birthdate: [birth year] Sex: female Admission Date (Current Location): 08/14/2015  [place] and [place] Number:  Engineering geologist and Address:  [facility], [address], [place], [place] [address]      Provider Number: [id number]  Attending Physician Name and Address:  Corky Mull, MD  Relative Name and Phone Number:       Current Level of Care: Hospital Recommended Level of Care: [place] Prior Approval Number:    Date Approved/Denied: 08/16/15 PASRR Number: [id number]  Discharge Plan: SNF    Current Diagnoses: Patient Active Problem List   Diagnosis Date Noted  . Patella fracture 08/15/2015  . Right patella fracture 08/14/2015  . Reflux esophagitis 06/23/2014  . HLD (hyperlipidemia) 06/23/2014  . Palpitations 06/23/2014  . Chronic kidney disease (CKD), stage III (moderate) 08/24/2009  . Diverticulitis of colon 05/14/2008  . Adult hypothyroidism 12/04/2006  . Migraine without aura 08/02/2006  . 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    Orientation RESPIRATION BLADDER Height & Weight     Self, Time, Situation, Place  Normal Continent Weight: 161 lb (73 kg) Height:  5\' 5"  (165.1 cm)  BEHAVIORAL SYMPTOMS/MOOD NEUROLOGICAL BOWEL NUTRITION STATUS      Continent    AMBULATORY STATUS COMMUNICATION OF NEEDS Skin   Extensive Assist Verbally Normal                       Personal Care Assistance Level of Assistance  Bathing, Feeding, Dressing Bathing Assistance: Limited assistance Feeding assistance: Independent Dressing Assistance: Limited assistance     Functional Limitations Info             SPECIAL CARE FACTORS FREQUENCY                       Contractures Contractures Info: Present    Additional Factors Info                  Current Medications (08/17/2015):  This is the current  hospital active medication list Current Facility-Administered Medications  Medication Dose Route Frequency Provider Last Rate Last Dose  . acetaminophen (TYLENOL) tablet 650 mg  650 mg Oral Q6H PRN Corky Mull, MD   650 mg at 08/17/15 [id number]   Or  . acetaminophen (TYLENOL) suppository 650 mg  650 mg Rectal Q6H PRN Corky Mull, MD      . aspirin chewable tablet 81 mg  81 mg Oral Daily Corky Mull, MD   81 mg at 08/17/15 0925  . bisacodyl (DULCOLAX) suppository 10 mg  10 mg Rectal Daily PRN Corky Mull, MD      . dextrose 5 % and 0.9 % NaCl with KCl 20 mEq/L infusion   Intravenous Continuous Corky Mull, MD 75 mL/hr at 08/16/15 1712    . diphenhydrAMINE (BENADRYL) 12.5 MG/5ML elixir 12.5-25 mg  12.5-25 mg Oral Q4H PRN Corky Mull, MD      . docusate sodium (COLACE) capsule 100 mg  100 mg Oral BID Corky Mull, MD   100 mg at 08/17/15 0925  . enoxaparin (LOVENOX) injection 40 mg  40 mg Subcutaneous Q24H Corky Mull, MD   40 mg at 08/17/15 0926  . fluticasone (FLONASE) 50 MCG/ACT nasal spray 1 spray  1 spray Each Nare Daily Corky Mull, MD      .  HYDROcodone-acetaminophen (NORCO/VICODIN) 5-325 MG per tablet 1-2 tablet  1-2 tablet Oral Q3H PRN Lattie Corns, PA-C      . HYDROmorphone (DILAUDID) injection 0.5-1 mg  0.5-1 mg Intravenous Q2H PRN Corky Mull, MD   1 mg at 08/16/15 0057  . latanoprost (XALATAN) 0.005 % ophthalmic solution 1 drop  1 drop Both Eyes QHS Corky Mull, MD   1 drop at 08/16/15 2047  . levothyroxine (SYNTHROID, LEVOTHROID) tablet 88 mcg  88 mcg Oral QAC breakfast Corky Mull, MD   88 mcg at 08/16/15 0941  . magnesium hydroxide (MILK OF MAGNESIA) suspension 30 mL  30 mL Oral Daily PRN Corky Mull, MD      . metoCLOPramide (REGLAN) tablet 5-10 mg  5-10 mg Oral Q8H PRN Corky Mull, MD       Or  . metoCLOPramide (REGLAN) injection 5-10 mg  5-10 mg Intravenous Q8H PRN Corky Mull, MD      . multivitamin with minerals tablet 1 tablet  1 tablet Oral Daily Corky Mull,  MD   1 tablet at 08/17/15 [phone]  . omega-3 acid ethyl esters (LOVAZA) capsule 1 g  1 g Oral Daily Pernell Dupre, RPH   1 g at 08/17/15 0926  . ondansetron (ZOFRAN) tablet 4 mg  4 mg Oral Q6H PRN Corky Mull, MD       Or  . ondansetron [facility]) injection 4 mg  4 mg Intravenous Q6H PRN Corky Mull, MD      . pantoprazole (PROTONIX) EC tablet 40 mg  40 mg Oral QAC breakfast Corky Mull, MD   40 mg at 08/17/15 [id number]  . polycarbophil (FIBERCON) tablet 625 mg  625 mg Oral Daily Corky Mull, MD   625 mg at 08/17/15 0925  . pravastatin (PRAVACHOL) tablet 80 mg  80 mg Oral Daily Corky Mull, MD   80 mg at 08/16/15 2047  . scopolamine (TRANSDERM-SCOP) 1 MG/3DAYS 1.5 mg  1 patch Transdermal Once Martha Clan, MD   1.5 mg at 08/15/15 1526  . sodium phosphate (FLEET) 7-19 GM/118ML enema 1 enema  1 enema Rectal Once PRN Corky Mull, MD      . topiramate (TOPAMAX) tablet 50 mg  50 mg Oral Daily Corky Mull, MD   50 mg at 08/16/15 0940     Discharge Medications: Please see discharge summary for a list of discharge medications.  Relevant Imaging Results:  Relevant Lab Results:   Additional Information    Jemina Scahill, Veronia Beets, LCSW

## 2015-08-18 DIAGNOSIS — E039 Hypothyroidism, unspecified: Secondary | ICD-10-CM | POA: Diagnosis not present

## 2015-08-18 DIAGNOSIS — E785 Hyperlipidemia, unspecified: Secondary | ICD-10-CM | POA: Diagnosis not present

## 2015-08-18 DIAGNOSIS — K5900 Constipation, unspecified: Secondary | ICD-10-CM | POA: Diagnosis not present

## 2015-08-18 DIAGNOSIS — M810 Age-related osteoporosis without current pathological fracture: Secondary | ICD-10-CM | POA: Diagnosis not present

## 2015-08-18 DIAGNOSIS — N183 Chronic kidney disease, stage 3 (moderate): Secondary | ICD-10-CM | POA: Diagnosis not present

## 2015-08-20 DIAGNOSIS — S82041A Displaced comminuted fracture of right patella, initial encounter for closed fracture: Secondary | ICD-10-CM | POA: Insufficient documentation

## 2015-08-31 DIAGNOSIS — Z8781 Personal history of (healed) traumatic fracture: Secondary | ICD-10-CM | POA: Diagnosis not present

## 2015-08-31 DIAGNOSIS — Z967 Presence of other bone and tendon implants: Secondary | ICD-10-CM | POA: Diagnosis not present

## 2015-09-12 DIAGNOSIS — M810 Age-related osteoporosis without current pathological fracture: Secondary | ICD-10-CM | POA: Diagnosis not present

## 2015-09-12 DIAGNOSIS — M1990 Unspecified osteoarthritis, unspecified site: Secondary | ICD-10-CM | POA: Diagnosis not present

## 2015-09-12 DIAGNOSIS — E039 Hypothyroidism, unspecified: Secondary | ICD-10-CM | POA: Diagnosis not present

## 2015-09-12 DIAGNOSIS — G43909 Migraine, unspecified, not intractable, without status migrainosus: Secondary | ICD-10-CM | POA: Diagnosis not present

## 2015-09-12 DIAGNOSIS — N183 Chronic kidney disease, stage 3 (moderate): Secondary | ICD-10-CM | POA: Diagnosis not present

## 2015-09-12 DIAGNOSIS — S82001D Unspecified fracture of right patella, subsequent encounter for closed fracture with routine healing: Secondary | ICD-10-CM | POA: Diagnosis not present

## 2015-09-12 DIAGNOSIS — E781 Pure hyperglyceridemia: Secondary | ICD-10-CM | POA: Diagnosis not present

## 2015-09-12 DIAGNOSIS — Z9181 History of falling: Secondary | ICD-10-CM | POA: Diagnosis not present

## 2015-09-12 DIAGNOSIS — K5792 Diverticulitis of intestine, part unspecified, without perforation or abscess without bleeding: Secondary | ICD-10-CM | POA: Diagnosis not present

## 2015-09-12 DIAGNOSIS — R531 Weakness: Secondary | ICD-10-CM | POA: Diagnosis not present

## 2015-09-23 ENCOUNTER — Telehealth: Payer: Self-pay | Admitting: Family Medicine

## 2015-09-24 NOTE — Telephone Encounter (Signed)
She will need a follow up

## 2015-09-28 ENCOUNTER — Encounter: Payer: Self-pay | Admitting: Family Medicine

## 2015-09-28 ENCOUNTER — Ambulatory Visit (INDEPENDENT_AMBULATORY_CARE_PROVIDER_SITE_OTHER): Payer: Medicare HMO | Admitting: Family Medicine

## 2015-09-28 VITALS — BP 154/64 | HR 87 | Temp 98.2°F | Resp 16 | Ht 65.0 in | Wt 157.8 lb

## 2015-09-28 DIAGNOSIS — R030 Elevated blood-pressure reading, without diagnosis of hypertension: Secondary | ICD-10-CM | POA: Diagnosis not present

## 2015-09-28 DIAGNOSIS — E039 Hypothyroidism, unspecified: Secondary | ICD-10-CM

## 2015-09-28 DIAGNOSIS — N183 Chronic kidney disease, stage 3 unspecified: Secondary | ICD-10-CM

## 2015-09-28 DIAGNOSIS — R7309 Other abnormal glucose: Secondary | ICD-10-CM | POA: Diagnosis not present

## 2015-09-28 DIAGNOSIS — Z23 Encounter for immunization: Secondary | ICD-10-CM | POA: Diagnosis not present

## 2015-09-28 DIAGNOSIS — S82041D Displaced comminuted fracture of right patella, subsequent encounter for closed fracture with routine healing: Secondary | ICD-10-CM | POA: Diagnosis not present

## 2015-09-28 DIAGNOSIS — IMO0001 Reserved for inherently not codable concepts without codable children: Secondary | ICD-10-CM

## 2015-09-28 LAB — CBC WITH DIFFERENTIAL/PLATELET
BASOS PCT: 1 %
Basophils Absolute: 54 cells/uL (ref 0–200)
Eosinophils Absolute: 324 cells/uL (ref 15–500)
Eosinophils Relative: 6 %
HCT: 39.5 % (ref 35.0–45.0)
Hemoglobin: 13 g/dL (ref 11.7–15.5)
LYMPHS PCT: 43 %
Lymphs Abs: 2322 cells/uL (ref 850–3900)
MCH: 30.6 pg (ref 27.0–33.0)
MCHC: 32.9 g/dL (ref 32.0–36.0)
MCV: 92.9 fL (ref 80.0–100.0)
MONOS PCT: 12 %
MPV: 10.9 fL (ref 7.5–12.5)
Monocytes Absolute: 648 cells/uL (ref 200–950)
NEUTROS ABS: 2052 {cells}/uL (ref 1500–7800)
Neutrophils Relative %: 38 %
PLATELETS: 237 10*3/uL (ref 140–400)
RBC: 4.25 MIL/uL (ref 3.80–5.10)
RDW: 13.7 % (ref 11.0–15.0)
WBC: 5.4 10*3/uL (ref 3.8–10.8)

## 2015-09-28 NOTE — Telephone Encounter (Signed)
Can you please contact patient and schedule an appointment with me?

## 2015-09-28 NOTE — Progress Notes (Signed)
Name: Theresa Hall   MRN: [id number]    DOB: 1940-06-11   Date:09/28/2015       Progress Note  Subjective  Chief Complaint  Chief Complaint  Patient presents with  . Hypertension    Patient states since breaking her leg at her gym, her BP has been elevated at [employer]. Also her Physical Therapist states she is concerned about it, BP    HPI  Daughter: [name] brought her in today  Elevated BP: she did not have a history of HTN, however since a patella fracture 08/2015 and since than her bp has been elevated in the 150's/80's. PT has been going to her house and bp is staying about the same. She has a history of CKI and nephrologist has suggested ARB/ACE in the past. She also has a history of hypothyroidism and her last TSH was 9 months ago. She denies palpitation, chest pain or SOB.    Hyperglycemia: she is due for labs, denies polyphagia, polyuria or polydipsia.   Patella Fracture: closed fracture, she failed at her church gym on 08/14/2015, doing well , currently on home PT, only taking pain medication prior to PT, pain right now is 1/10, pain can go up to 8/10   Patient Active Problem List   Diagnosis Date Noted  . Closed displaced comminuted fracture of right patella 08/20/2015  . Reflux esophagitis 06/23/2014  . HLD (hyperlipidemia) 06/23/2014  . Palpitations 06/23/2014  . Chronic kidney disease (CKD), stage III (moderate) 08/24/2009  . History of diverticulitis 05/14/2008  . Adult hypothyroidism 12/04/2006  . Migraine without aura 08/02/2006  . 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 APPENDECTOMY    . BASAL CELL CARCINOMA EXCISION    . BASAL CELL CARCINOMA EXCISION  2014   nasal  . CHOLECYSTECTOMY    . COLONOSCOPY    . ORIF PATELLA Right 08/15/2015   Procedure: OPEN REDUCTION INTERNAL (ORIF) FIXATION PATELLA  and application wound vac;  Surgeon: Corky Mull, MD;  Location: [facility] ORS;  Service: Orthopedics;   Laterality: Right;  . TONSILLECTOMY    . VAGINAL HYSTERECTOMY      Family History  Problem Relation Age of Onset  . Heart attack Father     x2  . Heart disease Sister   . Heart attack Sister   . Heart attack Brother   . Heart failure Brother     Social History   Social History  . Marital status: Married    Spouse name: N/A  . Number of children: N/A  . Years of education: N/A   Occupational History  . Not on file.   Social History Main Topics  . Smoking status: Never Smoker  . Smokeless tobacco: Never Used  . Alcohol use No  . Drug use: No  . Sexual activity: No     Comment: [id number]   Other Topics Concern  . Not on file   Social History Narrative  . No narrative on file     Current Outpatient Prescriptions:  .  ASPIRIN LOW DOSE 81 MG chewable tablet, Chew 1 tablet by mouth daily., Disp: , Rfl:  .  Calcium Polycarbophil (FIBERCON PO), Take by mouth daily., Disp: , Rfl:  .  Fish Oil-Cholecalciferol (FISH OIL + D3) 1200-1000 MG-UNIT CAPS, Take 1 capsule by mouth daily., Disp: , Rfl:  .  HYDROcodone-acetaminophen (NORCO/VICODIN) 5-325 MG tablet, Take 1-2 tablets by mouth every 4 (four) hours as needed (breakthrough pain).,  Disp: 60 tablet, Rfl: 0 .  latanoprost (XALATAN) 0.005 % ophthalmic solution, PLACE 1 DROP INTO BOTH EYES AT BEDTIME, Disp: , Rfl: 5 .  levothyroxine (SYNTHROID) 88 MCG tablet, Take 1 tablet (88 mcg total) by mouth daily. (Patient taking differently: Take 88 mcg by mouth daily. Brand Only), Disp: 90 tablet, Rfl: 3 .  mometasone (NASONEX) 50 MCG/ACT nasal spray, Place 2 sprays into the nose daily., Disp: 17 g, Rfl: 3 .  Multiple Vitamin (MULTIVITAMIN) tablet, Take 1 tablet by mouth daily., Disp: , Rfl:  .  pravastatin (PRAVACHOL) 80 MG tablet, Take 1 tablet (80 mg total) by mouth daily., Disp: 90 tablet, Rfl: 3 .  ranitidine (ZANTAC) 150 MG tablet, Take 1 tablet (150 mg total) by mouth 2 (two) times daily., Disp: 180 tablet, Rfl: 3 .  topiramate  (TOPAMAX) 50 MG tablet, Take 1 tablet (50 mg total) by mouth daily., Disp: 90 tablet, Rfl: 3 .  Vitamin D, Ergocalciferol, (DRISDOL) 50000 units CAPS capsule, Take 50,000 Units by mouth every 7 (seven) days., Disp: , Rfl:   Allergies  Allergen Reactions  . Codeine Other (See Comments)    Patient had insomnia and nausea  . Oxycodone Other (See Comments)    Made her "Loopy"     ROS  Ten systems reviewed and is negative except as mentioned in HPI   Objective  Vitals:   09/28/15 1457 09/28/15 1530  BP: (!) 152/88 (!) 154/64  Pulse: 87   Resp: 16   Temp: 98.2 F (36.8 C)   TempSrc: Oral   SpO2: 97%   Weight: 157 lb 12.8 oz (71.6 kg)   Height: 5' 5"  (1.651 m)     Body mass index is 26.26 kg/m.  Physical Exam  Constitutional: Patient appears well-developed and well-nourished. Obese  No distress.  HEENT: head atraumatic, normocephalic, pupils equal and reactive to light,  neck supple, throat within normal limits Cardiovascular: Normal rate, regular rhythm and normal heart sounds.  No murmur heard. No BLE edema. Pulmonary/Chest: Effort normal and breath sounds normal. No respiratory distress. Abdominal: Soft.  There is no tenderness. Psychiatric: Patient has a normal mood and affect. behavior is normal. Judgment and thought content normal. Muscular skeletal: she has a walker and also a brace on right leg.   Recent Results (from the past 2160 hour(s))  Comprehensive metabolic panel     Status: Abnormal   Collection Time: 08/14/15  9:48 PM  Result Value Ref Range   Sodium 140 135 - 145 mmol/L   Potassium 3.8 3.5 - 5.1 mmol/L   Chloride 109 101 - 111 mmol/L   CO2 23 22 - 32 mmol/L   Glucose, Bld 96 65 - 99 mg/dL   BUN 20 6 - 20 mg/dL   Creatinine, Ser 0.97 0.44 - 1.00 mg/dL   Calcium 9.5 8.9 - 10.3 mg/dL   Total Protein 7.1 6.5 - 8.1 g/dL   Albumin 4.4 3.5 - 5.0 g/dL   AST 30 15 - 41 U/L   ALT 31 14 - 54 U/L   Alkaline Phosphatase 92 38 - 126 U/L   Total Bilirubin 0.7  0.3 - 1.2 mg/dL   GFR calc non Af Amer 55 (L) >60 mL/min   GFR calc Af Amer >60 >60 mL/min    Comment: (NOTE) The eGFR has been calculated using the CKD EPI equation. This calculation has not been validated in all clinical situations. eGFR's persistently <60 mL/min signify possible Chronic Kidney Disease.    Anion gap 8  5 - 15  CBC with Differential     Status: None   Collection Time: 08/14/15  9:48 PM  Result Value Ref Range   WBC 6.4 3.6 - 11.0 K/uL   RBC 4.35 3.80 - 5.20 MIL/uL   Hemoglobin 13.8 12.0 - 16.0 g/dL   HCT 39.4 35.0 - 47.0 %   MCV 90.5 80.0 - 100.0 fL   MCH 31.8 26.0 - 34.0 pg   MCHC 35.1 32.0 - 36.0 g/dL   RDW 12.7 11.5 - 14.5 %   Platelets 188 150 - 440 K/uL   Neutrophils Relative % 55 %   Neutro Abs 3.6 1.4 - 6.5 K/uL   Lymphocytes Relative 33 %   Lymphs Abs 2.1 1.0 - 3.6 K/uL   Monocytes Relative 8 %   Monocytes Absolute 0.5 0.2 - 0.9 K/uL   Eosinophils Relative 3 %   Eosinophils Absolute 0.2 0 - 0.7 K/uL   Basophils Relative 1 %   Basophils Absolute 0.1 0 - 0.1 K/uL  Surgical pcr screen     Status: None   Collection Time: 08/15/15 12:29 AM  Result Value Ref Range   MRSA, PCR NEGATIVE NEGATIVE   Staphylococcus aureus NEGATIVE NEGATIVE    Comment:        The Xpert SA Assay (FDA approved for NASAL specimens in patients over 23 years of age), is one component of a comprehensive surveillance program.  Test performance has been validated by [facility] for patients greater than or equal to 51 year old. It is not intended to diagnose infection nor to guide or monitor treatment.   Basic metabolic panel     Status: Abnormal   Collection Time: 08/16/15  4:51 AM  Result Value Ref Range   Sodium 140 135 - 145 mmol/L   Potassium 3.6 3.5 - 5.1 mmol/L   Chloride 110 101 - 111 mmol/L   CO2 23 22 - 32 mmol/L   Glucose, Bld 134 (H) 65 - 99 mg/dL   BUN 12 6 - 20 mg/dL   Creatinine, Ser 0.95 0.44 - 1.00 mg/dL   Calcium 8.9 8.9 - 10.3 mg/dL   GFR calc non  Af Amer 57 (L) >60 mL/min   GFR calc Af Amer >60 >60 mL/min    Comment: (NOTE) The eGFR has been calculated using the CKD EPI equation. This calculation has not been validated in all clinical situations. eGFR's persistently <60 mL/min signify possible Chronic Kidney Disease.    Anion gap 7 5 - 15  Basic metabolic panel     Status: Abnormal   Collection Time: 08/17/15  4:22 AM  Result Value Ref Range   Sodium 144 135 - 145 mmol/L   Potassium 3.8 3.5 - 5.1 mmol/L   Chloride 115 (H) 101 - 111 mmol/L   CO2 24 22 - 32 mmol/L   Glucose, Bld 104 (H) 65 - 99 mg/dL   BUN 18 6 - 20 mg/dL   Creatinine, Ser 1.02 (H) 0.44 - 1.00 mg/dL   Calcium 8.8 (L) 8.9 - 10.3 mg/dL   GFR calc non Af Amer 52 (L) >60 mL/min   GFR calc Af Amer >60 >60 mL/min    Comment: (NOTE) The eGFR has been calculated using the CKD EPI equation. This calculation has not been validated in all clinical situations. eGFR's persistently <60 mL/min signify possible Chronic Kidney Disease.    Anion gap 5 5 - 15  CBC     Status: Abnormal   Collection Time: 08/17/15  4:22 AM  Result Value Ref Range   WBC 6.8 3.6 - 11.0 K/uL   RBC 3.69 (L) 3.80 - 5.20 MIL/uL   Hemoglobin 12.0 12.0 - 16.0 g/dL   HCT 33.4 (L) 35.0 - 47.0 %   MCV 90.4 80.0 - 100.0 fL   MCH 32.5 26.0 - 34.0 pg   MCHC 36.0 32.0 - 36.0 g/dL   RDW 12.8 11.5 - 14.5 %   Platelets 148 (L) 150 - 440 K/uL  Vitamin B12     Status: None   Collection Time: 08/17/15  7:54 AM  Result Value Ref Range   Vitamin B-12 691 180 - 914 pg/mL    Comment: (NOTE) This assay is not validated for testing neonatal or myeloproliferative syndrome specimens for Vitamin B12 levels. Performed at [facility]       PHQ2/9: Depression screen [facility] 2/9 01/06/2015  Decreased Interest 0  Down, Depressed, Hopeless 0  PHQ - 2 Score 0     Fall Risk: Fall Risk  01/06/2015 06/23/2014  Falls in the past year? No No      Assessment & Plan  1. Elevated blood  pressure  Discussed starting her on ARB but she would like to hold off for now, she does not like medications  - COMPLETE METABOLIC PANEL WITH GFR - TSH - CBC with Differential/Platelet  2. Hypothyroidism, unspecified hypothyroidism type  - TSH  3. Chronic kidney disease (CKD), stage III (moderate)  - COMPLETE METABOLIC PANEL WITH GFR - CBC with Differential/Platelet  4. Elevated glucose  - Hemoglobin A1c  5. Need for pneumococcal vaccination  - Pneumococcal polysaccharide vaccine 23-valent greater than or equal to 2yo subcutaneous/IM  6. Needs flu shot  - Flu vaccine HIGH DOSE PF

## 2015-09-29 LAB — COMPLETE METABOLIC PANEL WITH GFR
ALBUMIN: 4.6 g/dL (ref 3.6–5.1)
ALK PHOS: 87 U/L (ref 33–130)
ALT: 18 U/L (ref 6–29)
AST: 21 U/L (ref 10–35)
BILIRUBIN TOTAL: 0.4 mg/dL (ref 0.2–1.2)
BUN: 17 mg/dL (ref 7–25)
CALCIUM: 10 mg/dL (ref 8.6–10.4)
CO2: 25 mmol/L (ref 20–31)
Chloride: 109 mmol/L (ref 98–110)
Creat: 1.04 mg/dL — ABNORMAL HIGH (ref 0.60–0.93)
GFR, EST AFRICAN AMERICAN: 60 mL/min (ref >=60)
GFR, EST NON AFRICAN AMERICAN: 52 mL/min — AB (ref >=60)
Glucose, Bld: 95 mg/dL (ref 65–99)
POTASSIUM: 4.4 mmol/L (ref 3.5–5.3)
SODIUM: 142 mmol/L (ref 135–146)
Total Protein: 7.1 g/dL (ref 6.1–8.1)

## 2015-09-29 LAB — TSH: TSH: 7.43 mIU/L — ABNORMAL HIGH

## 2015-09-29 LAB — HEMOGLOBIN A1C
Hgb A1c MFr Bld: 4.9 % (ref <5.7)
Mean Plasma Glucose: 94 mg/dL

## 2015-09-30 ENCOUNTER — Other Ambulatory Visit: Payer: Self-pay | Admitting: Family Medicine

## 2015-09-30 MED ORDER — LEVOTHYROXINE SODIUM 88 MCG PO TABS: 88.0000 ug | ORAL_TABLET | Freq: Every day | ORAL | 0 refills | Status: DC

## 2015-10-12 DIAGNOSIS — H401131 Primary open-angle glaucoma, bilateral, mild stage: Secondary | ICD-10-CM | POA: Diagnosis not present

## 2015-10-19 DIAGNOSIS — H401131 Primary open-angle glaucoma, bilateral, mild stage: Secondary | ICD-10-CM | POA: Diagnosis not present

## 2015-11-09 DIAGNOSIS — S82041D Displaced comminuted fracture of right patella, subsequent encounter for closed fracture with routine healing: Secondary | ICD-10-CM | POA: Diagnosis not present

## 2015-11-11 ENCOUNTER — Ambulatory Visit (INDEPENDENT_AMBULATORY_CARE_PROVIDER_SITE_OTHER): Payer: Medicare HMO | Admitting: Family Medicine

## 2015-11-11 ENCOUNTER — Other Ambulatory Visit: Payer: Self-pay | Admitting: Family Medicine

## 2015-11-11 ENCOUNTER — Encounter: Payer: Self-pay | Admitting: Family Medicine

## 2015-11-11 VITALS — BP 152/88 | HR 96 | Temp 99.2°F | Resp 16 | Ht 65.0 in | Wt 153.2 lb

## 2015-11-11 DIAGNOSIS — N183 Chronic kidney disease, stage 3 unspecified: Secondary | ICD-10-CM

## 2015-11-11 DIAGNOSIS — Z8781 Personal history of (healed) traumatic fracture: Secondary | ICD-10-CM | POA: Diagnosis not present

## 2015-11-11 DIAGNOSIS — I10 Essential (primary) hypertension: Secondary | ICD-10-CM

## 2015-11-11 DIAGNOSIS — E039 Hypothyroidism, unspecified: Secondary | ICD-10-CM | POA: Diagnosis not present

## 2015-11-11 LAB — TSH: TSH: 3.65 m[IU]/L

## 2015-11-11 MED ORDER — VALSARTAN 160 MG PO TABS: 80.0000 mg | ORAL_TABLET | Freq: Every day | ORAL | 0 refills | Status: DC

## 2015-11-11 NOTE — Progress Notes (Signed)
Name: Theresa Hall   MRN: [id number]    DOB: [birth year]   Date:11/11/2015       Progress Note  Subjective  Chief Complaint  Chief Complaint  Patient presents with  . Follow-up    6 week F/U  . Hypertension    Patient states since Surgery her BP has become a little better, some days are around 140/70's but other days it will be 150/80's. Patient is still in pain from her right knee surgery.     HPI  HTN and she has a history of CKI stage III: last labs showed GFR of 55 Sees Dr. Abigail Butts. BP has been elevated since admission for right patella fracture. Pain level is under control, but bp is still elevated. Discussed medication and we will try Diovan. Monitor bp at home.   Hypothyroidism: last TSH was elevated, we adjusted dose to 88 mcg daily and one and a half T and T. She has not noticed an improvement on her energy level. She still feels tired. No palpitation, but she has noticed some decrease in tolerance since she has not been active since she had right patella fracuture   Patient Active Problem List   Diagnosis Date Noted  . Closed displaced comminuted fracture of right patella 08/20/2015  . Reflux esophagitis 06/23/2014  . HLD (hyperlipidemia) 06/23/2014  . Palpitations 06/23/2014  . Chronic kidney disease (CKD), stage III (moderate) 08/24/2009  . History of diverticulitis 05/14/2008  . Adult hypothyroidism 12/04/2006  . Migraine without aura 08/02/2006  . 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 APPENDECTOMY    . BASAL CELL CARCINOMA EXCISION    . BASAL CELL CARCINOMA EXCISION  2014   nasal  . CHOLECYSTECTOMY    . COLONOSCOPY    . ORIF PATELLA Right 08/15/2015   Procedure: OPEN REDUCTION INTERNAL (ORIF) FIXATION PATELLA  and application wound vac;  Surgeon: Corky Mull, MD;  Location: [facility] ORS;  Service: Orthopedics;  Laterality: Right;  . TONSILLECTOMY    . VAGINAL HYSTERECTOMY      Family History  Problem  Relation Age of Onset  . Heart attack Father     x2  . Heart disease Sister   . Heart attack Sister   . Heart attack Brother   . Heart failure Brother     Social History   Social History  . Marital status: Married    Spouse name: N/A  . Number of children: N/A  . Years of education: N/A   Occupational History  . Not on file.   Social History Main Topics  . Smoking status: Never Smoker  . Smokeless tobacco: Never Used  . Alcohol use No  . Drug use: No  . Sexual activity: No     Comment: [id number]   Other Topics Concern  . Not on file   Social History Narrative  . No narrative on file     Current Outpatient Prescriptions:  .  acetaminophen (TYLENOL) 500 MG tablet, Take 1,000 mg by mouth every 6 (six) hours as needed., Disp: , Rfl:  .  ASPIRIN LOW DOSE 81 MG chewable tablet, Chew 1 tablet by mouth daily., Disp: , Rfl:  .  Calcium Polycarbophil (FIBERCON PO), Take by mouth daily., Disp: , Rfl:  .  Cholecalciferol (VITAMIN D) 2000 units CAPS, Take 1 capsule by mouth daily., Disp: , Rfl:  .  Fish Oil-Cholecalciferol (FISH OIL + D3) 1200-1000 MG-UNIT CAPS, Take 1 capsule by  mouth daily., Disp: , Rfl:  .  latanoprost (XALATAN) 0.005 % ophthalmic solution, PLACE 1 DROP INTO BOTH EYES AT BEDTIME, Disp: , Rfl: 5 .  levothyroxine (SYNTHROID) 88 MCG tablet, Take 1 tablet (88 mcg total) by mouth daily. But one and a half on T and T Brand Only, Disp: 100 tablet, Rfl: 0 .  Multiple Vitamin (MULTIVITAMIN) tablet, Take 1 tablet by mouth daily., Disp: , Rfl:  .  pravastatin (PRAVACHOL) 80 MG tablet, Take 1 tablet (80 mg total) by mouth daily., Disp: 90 tablet, Rfl: 3 .  ranitidine (ZANTAC) 150 MG tablet, Take 1 tablet (150 mg total) by mouth 2 (two) times daily., Disp: 180 tablet, Rfl: 3 .  topiramate (TOPAMAX) 50 MG tablet, Take 1 tablet (50 mg total) by mouth daily., Disp: 90 tablet, Rfl: 3 .  mometasone (NASONEX) 50 MCG/ACT nasal spray, Place 2 sprays into the nose daily. (Patient not  taking: Reported on 11/11/2015), Disp: 17 g, Rfl: 3 .  valsartan (DIOVAN) 160 MG tablet, Take 0.5-1 tablets (80-160 mg total) by mouth daily., Disp: 30 tablet, Rfl: 0  Allergies  Allergen Reactions  . Codeine Other (See Comments)    Patient had insomnia and nausea  . Oxycodone Other (See Comments)    Made her "Loopy"     ROS  Ten systems reviewed and is negative except as mentioned in HPI    Objective  Vitals:   11/11/15 1029  BP: (!) 152/88  Pulse: 96  Resp: 16  Temp: 99.2 F (37.3 C)  TempSrc: Oral  SpO2: 97%  Weight: 153 lb 3.2 oz (69.5 kg)  Height: _0  (1.651 m)    Body mass index is 25.49 kg/m.  Physical Exam  Constitutional: Patient appears well-developed and well-nourished. No distress.  HEENT: head atraumatic, normocephalic, pupils equal and reactive to light,  neck supple, throat within normal limits Cardiovascular: Normal rate, regular rhythm and normal heart sounds.  No murmur heard. No BLE edema. Pulmonary/Chest: Effort normal and breath sounds normal. No respiratory distress. Abdominal: Soft.  There is no tenderness. Psychiatric: Patient has a normal mood and affect. behavior is normal. Judgment and thought content normal.  Recent Results (from the past 2160 hour(s))  Comprehensive metabolic panel     Status: Abnormal   Collection Time: 08/14/15  9:48 PM  Result Value Ref Range   Sodium 140 135 - 145 mmol/L   Potassium 3.8 3.5 - 5.1 mmol/L   Chloride 109 101 - 111 mmol/L   CO2 23 22 - 32 mmol/L   Glucose, Bld 96 65 - 99 mg/dL   BUN 20 6 - 20 mg/dL   Creatinine, Ser 0.97 0.44 - 1.00 mg/dL   Calcium 9.5 8.9 - 10.3 mg/dL   Total Protein 7.1 6.5 - 8.1 g/dL   Albumin 4.4 3.5 - 5.0 g/dL   AST 30 15 - 41 U/L   ALT 31 14 - 54 U/L   Alkaline Phosphatase 92 38 - 126 U/L   Total Bilirubin 0.7 0.3 - 1.2 mg/dL   GFR calc non Af Amer 55 (L) >60 mL/min   GFR calc Af Amer >60 >60 mL/min    Comment: (NOTE) The eGFR has been calculated using the CKD EPI  equation. This calculation has not been validated in all clinical situations. eGFR's persistently <60 mL/min signify possible Chronic Kidney Disease.    Anion gap 8 5 - 15  CBC with Differential     Status: None   Collection Time: 08/14/15  9:48 PM  Result Value Ref Range   WBC 6.4 3.6 - 11.0 K/uL   RBC 4.35 3.80 - 5.20 MIL/uL   Hemoglobin 13.8 12.0 - 16.0 g/dL   HCT 39.4 35.0 - 47.0 %   MCV 90.5 80.0 - 100.0 fL   MCH 31.8 26.0 - 34.0 pg   MCHC 35.1 32.0 - 36.0 g/dL   RDW 12.7 11.5 - 14.5 %   Platelets 188 150 - 440 K/uL   Neutrophils Relative % 55 %   Neutro Abs 3.6 1.4 - 6.5 K/uL   Lymphocytes Relative 33 %   Lymphs Abs 2.1 1.0 - 3.6 K/uL   Monocytes Relative 8 %   Monocytes Absolute 0.5 0.2 - 0.9 K/uL   Eosinophils Relative 3 %   Eosinophils Absolute 0.2 0 - 0.7 K/uL   Basophils Relative 1 %   Basophils Absolute 0.1 0 - 0.1 K/uL  Surgical pcr screen     Status: None   Collection Time: 08/15/15 12:29 AM  Result Value Ref Range   MRSA, PCR NEGATIVE NEGATIVE   Staphylococcus aureus NEGATIVE NEGATIVE    Comment:        The Xpert SA Assay (FDA approved for NASAL specimens in patients over 50 years of age), is one component of a comprehensive surveillance program.  Test performance has been validated by [facility] for patients greater than or equal to 27 year old. It is not intended to diagnose infection nor to guide or monitor treatment.   Basic metabolic panel     Status: Abnormal   Collection Time: 08/16/15  4:51 AM  Result Value Ref Range   Sodium 140 135 - 145 mmol/L   Potassium 3.6 3.5 - 5.1 mmol/L   Chloride 110 101 - 111 mmol/L   CO2 23 22 - 32 mmol/L   Glucose, Bld 134 (H) 65 - 99 mg/dL   BUN 12 6 - 20 mg/dL   Creatinine, Ser 0.95 0.44 - 1.00 mg/dL   Calcium 8.9 8.9 - 10.3 mg/dL   GFR calc non Af Amer 57 (L) >60 mL/min   GFR calc Af Amer >60 >60 mL/min    Comment: (NOTE) The eGFR has been calculated using the CKD EPI equation. This calculation has  not been validated in all clinical situations. eGFR's persistently <60 mL/min signify possible Chronic Kidney Disease.    Anion gap 7 5 - 15  Basic metabolic panel     Status: Abnormal   Collection Time: 08/17/15  4:22 AM  Result Value Ref Range   Sodium 144 135 - 145 mmol/L   Potassium 3.8 3.5 - 5.1 mmol/L   Chloride 115 (H) 101 - 111 mmol/L   CO2 24 22 - 32 mmol/L   Glucose, Bld 104 (H) 65 - 99 mg/dL   BUN 18 6 - 20 mg/dL   Creatinine, Ser 1.02 (H) 0.44 - 1.00 mg/dL   Calcium 8.8 (L) 8.9 - 10.3 mg/dL   GFR calc non Af Amer 52 (L) >60 mL/min   GFR calc Af Amer >60 >60 mL/min    Comment: (NOTE) The eGFR has been calculated using the CKD EPI equation. This calculation has not been validated in all clinical situations. eGFR's persistently <60 mL/min signify possible Chronic Kidney Disease.    Anion gap 5 5 - 15  CBC     Status: Abnormal   Collection Time: 08/17/15  4:22 AM  Result Value Ref Range   WBC 6.8 3.6 - 11.0 K/uL   RBC 3.69 (L) 3.80 -  5.20 MIL/uL   Hemoglobin 12.0 12.0 - 16.0 g/dL   HCT 33.4 (L) 35.0 - 47.0 %   MCV 90.4 80.0 - 100.0 fL   MCH 32.5 26.0 - 34.0 pg   MCHC 36.0 32.0 - 36.0 g/dL   RDW 12.8 11.5 - 14.5 %   Platelets 148 (L) 150 - 440 K/uL  Vitamin B12     Status: None   Collection Time: 08/17/15  7:54 AM  Result Value Ref Range   Vitamin B-12 691 180 - 914 pg/mL    Comment: (NOTE) This assay is not validated for testing neonatal or myeloproliferative syndrome specimens for Vitamin B12 levels. Performed at [facility]   Hemoglobin A1c     Status: None   Collection Time: 09/28/15  3:37 PM  Result Value Ref Range   Hgb A1c MFr Bld 4.9 <5.7 %    Comment:   For the purpose of screening for the presence of diabetes:   <5.7%       Consistent with the absence of diabetes 5.7-6.4 %   Consistent with increased risk for diabetes (prediabetes) >=6.5 %     Consistent with diabetes   This assay result is consistent with a decreased risk of  diabetes.   Currently, no consensus exists regarding use of hemoglobin A1c for diagnosis of diabetes in children.   According to American Diabetes Association (ADA) guidelines, hemoglobin A1c <7.0% represents optimal control in non-pregnant diabetic patients. Different metrics may apply to specific patient populations. Standards of Medical Care in Diabetes (ADA).      Mean Plasma Glucose 94 mg/dL  COMPLETE METABOLIC PANEL WITH GFR     Status: Abnormal   Collection Time: 09/28/15  3:37 PM  Result Value Ref Range   Sodium 142 135 - 146 mmol/L   Potassium 4.4 3.5 - 5.3 mmol/L   Chloride 109 98 - 110 mmol/L   CO2 25 20 - 31 mmol/L   Glucose, Bld 95 65 - 99 mg/dL   BUN 17 7 - 25 mg/dL   Creat 1.04 (H) 0.60 - 0.93 mg/dL    Comment:   For patients > or = 77 years of age: The upper reference limit for Creatinine is approximately 13% higher for people identified as African-American.      Total Bilirubin 0.4 0.2 - 1.2 mg/dL   Alkaline Phosphatase 87 33 - 130 U/L   AST 21 10 - 35 U/L   ALT 18 6 - 29 U/L   Total Protein 7.1 6.1 - 8.1 g/dL   Albumin 4.6 3.6 - 5.1 g/dL   Calcium 10.0 8.6 - 10.4 mg/dL   GFR, Est African American 60 >=60 mL/min   GFR, Est Non African American 52 (L) >=60 mL/min  TSH     Status: Abnormal   Collection Time: 09/28/15  3:37 PM  Result Value Ref Range   TSH 7.43 (H) mIU/L    Comment:   Reference Range   > or = 20 Years  0.40-4.50   Pregnancy Range First trimester  0.26-2.66 Second trimester 0.55-2.73 Third trimester  0.43-2.91     CBC with Differential/Platelet     Status: None   Collection Time: 09/28/15  3:37 PM  Result Value Ref Range   WBC 5.4 3.8 - 10.8 K/uL   RBC 4.25 3.80 - 5.10 MIL/uL   Hemoglobin 13.0 11.7 - 15.5 g/dL   HCT 39.5 35.0 - 45.0 %   MCV 92.9 80.0 - 100.0 fL   MCH 30.6 27.0 -  33.0 pg   MCHC 32.9 32.0 - 36.0 g/dL   RDW 13.7 11.0 - 15.0 %   Platelets 237 140 - 400 K/uL   MPV 10.9 7.5 - 12.5 fL   Neutro Abs 2,052 1,500 -  7,800 cells/uL   Lymphs Abs 2,322 850 - 3,900 cells/uL   Monocytes Absolute 648 200 - 950 cells/uL   Eosinophils Absolute 324 15 - 500 cells/uL   Basophils Absolute 54 0 - 200 cells/uL   Neutrophils Relative % 38 %   Lymphocytes Relative 43 %   Monocytes Relative 12 %   Eosinophils Relative 6 %   Basophils Relative 1 %   Smear Review Criteria for review not met      PHQ2/9: Depression screen [facility] 2/9 11/11/2015 01/06/2015  Decreased Interest 0 0  Down, Depressed, Hopeless 0 0  PHQ - 2 Score 0 0     Fall Risk: Fall Risk  11/11/2015 01/06/2015 06/23/2014  Falls in the past year? Yes No No  Number falls in past yr: 1 - -  Injury with Fall? Yes - -  Risk for fall due to : History of fall(s) - -  Follow up Falls prevention discussed - -     Assessment & Plan  1. Hypertension, benign  Discussed starting medication and possible side effects, she is afraid of trying ACE because  - valsartan (DIOVAN) 160 MG tablet; Take 0.5-1 tablets (80-160 mg total) by mouth daily.  Dispense: 30 tablet; Refill: 0  2. Chronic kidney disease, stage II (mild)  - valsartan (DIOVAN) 160 MG tablet; Take 0.5-1 tablets (80-160 mg total) by mouth daily.  Dispense: 30 tablet; Refill: 0  3. Adult hypothyroidism  - TSH  4. History of fracture due to fall  Accidental fall, she just finished home PT, able to drive again and will start 6 weeks of outpatient PT

## 2015-11-11 NOTE — Telephone Encounter (Signed)
Patient requesting refill of Valsartan to [facility].

## 2015-11-12 ENCOUNTER — Telehealth: Payer: Self-pay | Admitting: Family Medicine

## 2015-11-12 NOTE — Telephone Encounter (Signed)
Patient returning your call. Will be home until 1:45pm. It is okay to leave detailed message on machine

## 2015-11-13 ENCOUNTER — Other Ambulatory Visit: Payer: Self-pay | Admitting: Family Medicine

## 2015-11-18 ENCOUNTER — Telehealth: Payer: Self-pay | Admitting: Family Medicine

## 2015-11-18 ENCOUNTER — Other Ambulatory Visit: Payer: Self-pay

## 2015-11-18 DIAGNOSIS — M6281 Muscle weakness (generalized): Secondary | ICD-10-CM | POA: Diagnosis not present

## 2015-11-18 DIAGNOSIS — S82041D Displaced comminuted fracture of right patella, subsequent encounter for closed fracture with routine healing: Secondary | ICD-10-CM | POA: Diagnosis not present

## 2015-11-18 DIAGNOSIS — G8929 Other chronic pain: Secondary | ICD-10-CM | POA: Diagnosis not present

## 2015-11-18 DIAGNOSIS — M25661 Stiffness of right knee, not elsewhere classified: Secondary | ICD-10-CM | POA: Diagnosis not present

## 2015-11-18 DIAGNOSIS — M25561 Pain in right knee: Secondary | ICD-10-CM | POA: Diagnosis not present

## 2015-11-18 NOTE — Telephone Encounter (Signed)
Theresa Hall from [place] states (pharmacy adherance calls) states patient is late filling her pravastatin prescription states that she was just seen this month. She was just wanting to let you know. You can reach her at [phone]

## 2015-11-23 DIAGNOSIS — M25661 Stiffness of right knee, not elsewhere classified: Secondary | ICD-10-CM | POA: Diagnosis not present

## 2015-11-23 DIAGNOSIS — M6281 Muscle weakness (generalized): Secondary | ICD-10-CM | POA: Diagnosis not present

## 2015-11-23 DIAGNOSIS — S82041D Displaced comminuted fracture of right patella, subsequent encounter for closed fracture with routine healing: Secondary | ICD-10-CM | POA: Diagnosis not present

## 2015-11-27 DIAGNOSIS — M25661 Stiffness of right knee, not elsewhere classified: Secondary | ICD-10-CM | POA: Diagnosis not present

## 2015-11-27 DIAGNOSIS — S82041D Displaced comminuted fracture of right patella, subsequent encounter for closed fracture with routine healing: Secondary | ICD-10-CM | POA: Diagnosis not present

## 2015-11-27 DIAGNOSIS — M6281 Muscle weakness (generalized): Secondary | ICD-10-CM | POA: Diagnosis not present

## 2015-11-30 DIAGNOSIS — M6281 Muscle weakness (generalized): Secondary | ICD-10-CM | POA: Diagnosis not present

## 2015-11-30 DIAGNOSIS — M25661 Stiffness of right knee, not elsewhere classified: Secondary | ICD-10-CM | POA: Diagnosis not present

## 2015-11-30 DIAGNOSIS — S82041D Displaced comminuted fracture of right patella, subsequent encounter for closed fracture with routine healing: Secondary | ICD-10-CM | POA: Diagnosis not present

## 2015-11-30 DIAGNOSIS — M25561 Pain in right knee: Secondary | ICD-10-CM | POA: Diagnosis not present

## 2015-11-30 DIAGNOSIS — G8929 Other chronic pain: Secondary | ICD-10-CM | POA: Diagnosis not present

## 2015-12-07 DIAGNOSIS — M6281 Muscle weakness (generalized): Secondary | ICD-10-CM | POA: Diagnosis not present

## 2015-12-07 DIAGNOSIS — S82041D Displaced comminuted fracture of right patella, subsequent encounter for closed fracture with routine healing: Secondary | ICD-10-CM | POA: Diagnosis not present

## 2015-12-07 DIAGNOSIS — G8929 Other chronic pain: Secondary | ICD-10-CM | POA: Diagnosis not present

## 2015-12-07 DIAGNOSIS — M25561 Pain in right knee: Secondary | ICD-10-CM | POA: Diagnosis not present

## 2015-12-07 DIAGNOSIS — M25661 Stiffness of right knee, not elsewhere classified: Secondary | ICD-10-CM | POA: Diagnosis not present

## 2015-12-11 DIAGNOSIS — M6281 Muscle weakness (generalized): Secondary | ICD-10-CM | POA: Diagnosis not present

## 2015-12-11 DIAGNOSIS — S82041D Displaced comminuted fracture of right patella, subsequent encounter for closed fracture with routine healing: Secondary | ICD-10-CM | POA: Diagnosis not present

## 2015-12-11 DIAGNOSIS — M25561 Pain in right knee: Secondary | ICD-10-CM | POA: Diagnosis not present

## 2015-12-11 DIAGNOSIS — M25661 Stiffness of right knee, not elsewhere classified: Secondary | ICD-10-CM | POA: Diagnosis not present

## 2015-12-11 DIAGNOSIS — G8929 Other chronic pain: Secondary | ICD-10-CM | POA: Diagnosis not present

## 2015-12-14 ENCOUNTER — Ambulatory Visit (INDEPENDENT_AMBULATORY_CARE_PROVIDER_SITE_OTHER): Payer: Medicare HMO | Admitting: Family Medicine

## 2015-12-14 ENCOUNTER — Encounter: Payer: Self-pay | Admitting: Family Medicine

## 2015-12-14 VITALS — BP 112/60 | HR 94 | Temp 98.0°F | Wt 153.8 lb

## 2015-12-14 DIAGNOSIS — S82041D Displaced comminuted fracture of right patella, subsequent encounter for closed fracture with routine healing: Secondary | ICD-10-CM | POA: Diagnosis not present

## 2015-12-14 DIAGNOSIS — G43009 Migraine without aura, not intractable, without status migrainosus: Secondary | ICD-10-CM

## 2015-12-14 DIAGNOSIS — N183 Chronic kidney disease, stage 3 unspecified: Secondary | ICD-10-CM

## 2015-12-14 DIAGNOSIS — M6281 Muscle weakness (generalized): Secondary | ICD-10-CM | POA: Diagnosis not present

## 2015-12-14 DIAGNOSIS — I10 Essential (primary) hypertension: Secondary | ICD-10-CM

## 2015-12-14 DIAGNOSIS — E039 Hypothyroidism, unspecified: Secondary | ICD-10-CM | POA: Diagnosis not present

## 2015-12-14 DIAGNOSIS — M25661 Stiffness of right knee, not elsewhere classified: Secondary | ICD-10-CM | POA: Diagnosis not present

## 2015-12-14 MED ORDER — VALSARTAN 160 MG PO TABS: 160.0000 mg | ORAL_TABLET | Freq: Every day | ORAL | 0 refills | Status: DC

## 2015-12-14 MED ORDER — LEVOTHYROXINE SODIUM 88 MCG PO TABS: 88.0000 ug | ORAL_TABLET | Freq: Every day | ORAL | 0 refills | Status: DC

## 2015-12-14 NOTE — Progress Notes (Signed)
Name: Theresa Hall   MRN: [id number]    DOB: 09-02-1940   Date:12/14/2015       Progress Note  Subjective  Chief Complaint  Chief Complaint  Patient presents with  . Follow-up  . Hypertension    Has bneen checking at home & has seen some higher systolic numbers in the 062'I. She has seen lower diastolic bottom numbers in the 70's.    HPI  HTN and she has a history of CKI stage III: last labs showed GFR was back to normal. Seeing Dr. Abigail Butts yearly. BP was  elevated since admission for right patella fracture. She was started on Diovan 160 mg one month ago and bp has been controlled at home. Around 140/70's  Hypothyroidism: last TSH was elevated, we adjusted dose to 88 mcg daily and one and a half T and T and she is taking brand name and last TSH one month ago was back to normal range. She still feels tired, but improved since last visit, she is still having PT from right knee fracture. She is going back to her regular routine now.   Migraine headaches: she had it for many years, and got worse when her husband was diagnosed with cancer. No symptoms  since her husband died. Pain described as like her head was going to explode and associated with nausea.Tolerating topamax well and is afraid to stop it.   Patient Active Problem List   Diagnosis Date Noted  . Closed displaced comminuted fracture of right patella 08/20/2015  . Reflux esophagitis 06/23/2014  . HLD (hyperlipidemia) 06/23/2014  . Palpitations 06/23/2014  . Chronic kidney disease (CKD), stage III (moderate) 08/24/2009  . History of diverticulitis 05/14/2008  . Adult hypothyroidism 12/04/2006  . Migraine without aura 08/02/2006  . 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 APPENDECTOMY    . BASAL CELL CARCINOMA EXCISION    . BASAL CELL CARCINOMA EXCISION  2014   nasal  . CHOLECYSTECTOMY    . COLONOSCOPY    . ORIF PATELLA Right 08/15/2015   Procedure: OPEN REDUCTION  INTERNAL (ORIF) FIXATION PATELLA  and application wound vac;  Surgeon: Corky Mull, MD;  Location: [facility] ORS;  Service: Orthopedics;  Laterality: Right;  . TONSILLECTOMY    . VAGINAL HYSTERECTOMY      Family History  Problem Relation Age of Onset  . Heart attack Father     x2  . Heart disease Sister   . Heart attack Sister   . Heart attack Brother   . Heart failure Brother     Social History   Social History  . Marital status: Married    Spouse name: N/A  . Number of children: N/A  . Years of education: N/A   Occupational History  . Not on file.   Social History Main Topics  . Smoking status: Never Smoker  . Smokeless tobacco: Never Used  . Alcohol use No  . Drug use: No  . Sexual activity: No     Comment: [id number]   Other Topics Concern  . Not on file   Social History Narrative  . No narrative on file     Current Outpatient Prescriptions:  .  ASPIRIN LOW DOSE 81 MG chewable tablet, Chew 1 tablet by mouth daily., Disp: , Rfl:  .  Calcium Polycarbophil (FIBERCON PO), Take by mouth daily., Disp: , Rfl:  .  Cholecalciferol (VITAMIN D) 2000 units CAPS, Take 2 capsules by mouth  daily. , Disp: , Rfl:  .  Fish Oil-Cholecalciferol (FISH OIL + D3) 1200-1000 MG-UNIT CAPS, Take 1 capsule by mouth daily., Disp: , Rfl:  .  latanoprost (XALATAN) 0.005 % ophthalmic solution, PLACE 1 DROP INTO BOTH EYES AT BEDTIME, Disp: , Rfl: 5 .  levothyroxine (SYNTHROID) 88 MCG tablet, Take 1 tablet (88 mcg total) by mouth daily. But one and a half on T and T Brand Only, Disp: 100 tablet, Rfl: 0 .  Multiple Vitamin (MULTIVITAMIN) tablet, Take 1 tablet by mouth daily., Disp: , Rfl:  .  pravastatin (PRAVACHOL) 80 MG tablet, Take 1 tablet (80 mg total) by mouth daily., Disp: 90 tablet, Rfl: 3 .  ranitidine (ZANTAC) 150 MG tablet, Take 1 tablet (150 mg total) by mouth 2 (two) times daily., Disp: 180 tablet, Rfl: 3 .  topiramate (TOPAMAX) 50 MG tablet, Take 1 tablet (50 mg total) by mouth daily.,  Disp: 90 tablet, Rfl: 3 .  valsartan (DIOVAN) 160 MG tablet, Take 1 tablet (160 mg total) by mouth daily., Disp: 90 tablet, Rfl: 0 .  acetaminophen (TYLENOL) 500 MG tablet, Take 1,000 mg by mouth every 6 (six) hours as needed., Disp: , Rfl:  .  mometasone (NASONEX) 50 MCG/ACT nasal spray, Place 2 sprays into the nose daily. (Patient not taking: Reported on 12/14/2015), Disp: 17 g, Rfl: 3  Allergies  Allergen Reactions  . Codeine Other (See Comments)    Patient had insomnia and nausea  . Oxycodone Other (See Comments)    Made her "Loopy"     ROS  Ten systems reviewed and is negative except as mentioned in HPI   Objective  Vitals:   12/14/15 0936  BP: 112/60  Pulse: 94  Temp: 98 F (36.7 C)  SpO2: 98%  Weight: 153 lb 12.8 oz (69.8 kg)    Body mass index is 25.59 kg/m.  Physical Exam  Constitutional: Patient appears well-developed and well-nourished.  No distress.  HEENT: head atraumatic, normocephalic, pupils equal and reactive to light, neck supple, throat within normal limits, normal thyroid  Cardiovascular: Normal rate, regular rhythm and normal heart sounds.  No murmur heard. No BLE edema. Pulmonary/Chest: Effort normal and breath sounds normal. No respiratory distress. Abdominal: Soft.  There is no tenderness. Psychiatric: Patient has a normal mood and affect. behavior is normal. Judgment and thought content normal.  Recent Results (from the past 2160 hour(s))  Hemoglobin A1c     Status: None   Collection Time: 09/28/15  3:37 PM  Result Value Ref Range   Hgb A1c MFr Bld 4.9 <5.7 %    Comment:   For the purpose of screening for the presence of diabetes:   <5.7%       Consistent with the absence of diabetes 5.7-6.4 %   Consistent with increased risk for diabetes (prediabetes) >=6.5 %     Consistent with diabetes   This assay result is consistent with a decreased risk of diabetes.   Currently, no consensus exists regarding use of hemoglobin A1c for diagnosis of  diabetes in children.   According to American Diabetes Association (ADA) guidelines, hemoglobin A1c <7.0% represents optimal control in non-pregnant diabetic patients. Different metrics may apply to specific patient populations. Standards of Medical Care in Diabetes (ADA).      Mean Plasma Glucose 94 mg/dL  COMPLETE METABOLIC PANEL WITH GFR     Status: Abnormal   Collection Time: 09/28/15  3:37 PM  Result Value Ref Range   Sodium 142 135 - 146 mmol/L  Potassium 4.4 3.5 - 5.3 mmol/L   Chloride 109 98 - 110 mmol/L   CO2 25 20 - 31 mmol/L   Glucose, Bld 95 65 - 99 mg/dL   BUN 17 7 - 25 mg/dL   Creat 1.04 (H) 0.60 - 0.93 mg/dL    Comment:   For patients > or = 77 years of age: The upper reference limit for Creatinine is approximately 13% higher for people identified as African-American.      Total Bilirubin 0.4 0.2 - 1.2 mg/dL   Alkaline Phosphatase 87 33 - 130 U/L   AST 21 10 - 35 U/L   ALT 18 6 - 29 U/L   Total Protein 7.1 6.1 - 8.1 g/dL   Albumin 4.6 3.6 - 5.1 g/dL   Calcium 10.0 8.6 - 10.4 mg/dL   GFR, Est African American 60 >=60 mL/min   GFR, Est Non African American 52 (L) >=60 mL/min  TSH     Status: Abnormal   Collection Time: 09/28/15  3:37 PM  Result Value Ref Range   TSH 7.43 (H) mIU/L    Comment:   Reference Range   > or = 20 Years  0.40-4.50   Pregnancy Range First trimester  0.26-2.66 Second trimester 0.55-2.73 Third trimester  0.43-2.91     CBC with Differential/Platelet     Status: None   Collection Time: 09/28/15  3:37 PM  Result Value Ref Range   WBC 5.4 3.8 - 10.8 K/uL   RBC 4.25 3.80 - 5.10 MIL/uL   Hemoglobin 13.0 11.7 - 15.5 g/dL   HCT 39.5 35.0 - 45.0 %   MCV 92.9 80.0 - 100.0 fL   MCH 30.6 27.0 - 33.0 pg   MCHC 32.9 32.0 - 36.0 g/dL   RDW 13.7 11.0 - 15.0 %   Platelets 237 140 - 400 K/uL   MPV 10.9 7.5 - 12.5 fL   Neutro Abs 2,052 1,500 - 7,800 cells/uL   Lymphs Abs 2,322 850 - 3,900 cells/uL   Monocytes Absolute 648 200 - 950  cells/uL   Eosinophils Absolute 324 15 - 500 cells/uL   Basophils Absolute 54 0 - 200 cells/uL   Neutrophils Relative % 38 %   Lymphocytes Relative 43 %   Monocytes Relative 12 %   Eosinophils Relative 6 %   Basophils Relative 1 %   Smear Review Criteria for review not met   TSH     Status: None   Collection Time: 11/11/15 11:26 AM  Result Value Ref Range   TSH 3.65 mIU/L    Comment:   Reference Range   > or = 20 Years  0.40-4.50   Pregnancy Range First trimester  0.26-2.66 Second trimester 0.55-2.73 Third trimester  0.43-2.91         PHQ2/9: Depression screen [facility] 2/9 12/14/2015 11/11/2015 01/06/2015  Decreased Interest 0 0 0  Down, Depressed, Hopeless 0 0 0  PHQ - 2 Score 0 0 0     Fall Risk: Fall Risk  12/14/2015 11/11/2015 01/06/2015 06/23/2014  Falls in the past year? Yes Yes No No  Number falls in past yr: 1 1 - -  Injury with Fall? Yes Yes - -  Risk for fall due to : - History of fall(s) - -  Follow up - Falls prevention discussed - -     Functional Status Survey: Is the patient deaf or have difficulty hearing?: No Does the patient have difficulty seeing, even when wearing glasses/contacts?: Yes (glasses) Does the patient  have difficulty concentrating, remembering, or making decisions?: No Does the patient have difficulty walking or climbing stairs?: No Does the patient have difficulty dressing or bathing?: Yes Does the patient have difficulty doing errands alone such as visiting a doctor's office or shopping?: No   Assessment & Plan  1. Hypertension, benign  - valsartan (DIOVAN) 160 MG tablet; Take 1 tablet (160 mg total) by mouth daily.  Dispense: 90 tablet; Refill: 0  2. Chronic kidney disease, stage III (moderate)  - valsartan (DIOVAN) 160 MG tablet; Take 1 tablet (160 mg total) by mouth daily.  Dispense: 90 tablet; Refill: 0  3. Adult hypothyroidism  Continue current dose of Synthroid  4. Migraine without aura and without status migrainosus,  not intractable  Doing well for years, discussed ways to wean medication off

## 2015-12-21 DIAGNOSIS — M25661 Stiffness of right knee, not elsewhere classified: Secondary | ICD-10-CM | POA: Diagnosis not present

## 2015-12-21 DIAGNOSIS — S82041D Displaced comminuted fracture of right patella, subsequent encounter for closed fracture with routine healing: Secondary | ICD-10-CM | POA: Diagnosis not present

## 2015-12-21 DIAGNOSIS — M6281 Muscle weakness (generalized): Secondary | ICD-10-CM | POA: Diagnosis not present

## 2015-12-25 ENCOUNTER — Ambulatory Visit: Payer: Medicare HMO | Admitting: Family Medicine

## 2015-12-25 DIAGNOSIS — S82041D Displaced comminuted fracture of right patella, subsequent encounter for closed fracture with routine healing: Secondary | ICD-10-CM | POA: Diagnosis not present

## 2015-12-25 DIAGNOSIS — M25661 Stiffness of right knee, not elsewhere classified: Secondary | ICD-10-CM | POA: Diagnosis not present

## 2015-12-25 DIAGNOSIS — G8929 Other chronic pain: Secondary | ICD-10-CM | POA: Diagnosis not present

## 2015-12-25 DIAGNOSIS — M25561 Pain in right knee: Secondary | ICD-10-CM | POA: Diagnosis not present

## 2015-12-28 DIAGNOSIS — M6281 Muscle weakness (generalized): Secondary | ICD-10-CM | POA: Diagnosis not present

## 2015-12-28 DIAGNOSIS — M25661 Stiffness of right knee, not elsewhere classified: Secondary | ICD-10-CM | POA: Diagnosis not present

## 2015-12-28 DIAGNOSIS — S82041D Displaced comminuted fracture of right patella, subsequent encounter for closed fracture with routine healing: Secondary | ICD-10-CM | POA: Diagnosis not present

## 2015-12-28 DIAGNOSIS — M25561 Pain in right knee: Secondary | ICD-10-CM | POA: Diagnosis not present

## 2015-12-28 DIAGNOSIS — G8929 Other chronic pain: Secondary | ICD-10-CM | POA: Diagnosis not present

## 2016-01-01 DIAGNOSIS — M25661 Stiffness of right knee, not elsewhere classified: Secondary | ICD-10-CM | POA: Diagnosis not present

## 2016-01-01 DIAGNOSIS — S82041D Displaced comminuted fracture of right patella, subsequent encounter for closed fracture with routine healing: Secondary | ICD-10-CM | POA: Diagnosis not present

## 2016-01-01 DIAGNOSIS — M6281 Muscle weakness (generalized): Secondary | ICD-10-CM | POA: Diagnosis not present

## 2016-01-06 DIAGNOSIS — G8929 Other chronic pain: Secondary | ICD-10-CM | POA: Diagnosis not present

## 2016-01-06 DIAGNOSIS — M25561 Pain in right knee: Secondary | ICD-10-CM | POA: Diagnosis not present

## 2016-01-06 DIAGNOSIS — M6281 Muscle weakness (generalized): Secondary | ICD-10-CM | POA: Diagnosis not present

## 2016-01-06 DIAGNOSIS — S82041D Displaced comminuted fracture of right patella, subsequent encounter for closed fracture with routine healing: Secondary | ICD-10-CM | POA: Diagnosis not present

## 2016-01-06 DIAGNOSIS — M25661 Stiffness of right knee, not elsewhere classified: Secondary | ICD-10-CM | POA: Diagnosis not present

## 2016-01-08 DIAGNOSIS — M25561 Pain in right knee: Secondary | ICD-10-CM | POA: Diagnosis not present

## 2016-01-08 DIAGNOSIS — S82041D Displaced comminuted fracture of right patella, subsequent encounter for closed fracture with routine healing: Secondary | ICD-10-CM | POA: Diagnosis not present

## 2016-01-08 DIAGNOSIS — M6281 Muscle weakness (generalized): Secondary | ICD-10-CM | POA: Diagnosis not present

## 2016-01-08 DIAGNOSIS — M25661 Stiffness of right knee, not elsewhere classified: Secondary | ICD-10-CM | POA: Diagnosis not present

## 2016-01-08 DIAGNOSIS — G8929 Other chronic pain: Secondary | ICD-10-CM | POA: Diagnosis not present

## 2016-01-20 DIAGNOSIS — M6281 Muscle weakness (generalized): Secondary | ICD-10-CM | POA: Diagnosis not present

## 2016-01-20 DIAGNOSIS — S82041D Displaced comminuted fracture of right patella, subsequent encounter for closed fracture with routine healing: Secondary | ICD-10-CM | POA: Diagnosis not present

## 2016-01-20 DIAGNOSIS — M25661 Stiffness of right knee, not elsewhere classified: Secondary | ICD-10-CM | POA: Diagnosis not present

## 2016-01-20 DIAGNOSIS — M25561 Pain in right knee: Secondary | ICD-10-CM | POA: Diagnosis not present

## 2016-01-20 DIAGNOSIS — G8929 Other chronic pain: Secondary | ICD-10-CM | POA: Diagnosis not present

## 2016-01-26 DIAGNOSIS — J111 Influenza due to unidentified influenza virus with other respiratory manifestations: Secondary | ICD-10-CM | POA: Diagnosis not present

## 2016-03-01 ENCOUNTER — Other Ambulatory Visit: Payer: Self-pay | Admitting: Family Medicine

## 2016-03-01 DIAGNOSIS — Z1231 Encounter for screening mammogram for malignant neoplasm of breast: Secondary | ICD-10-CM

## 2016-03-15 ENCOUNTER — Other Ambulatory Visit: Payer: Self-pay

## 2016-03-15 DIAGNOSIS — I10 Essential (primary) hypertension: Secondary | ICD-10-CM

## 2016-03-15 DIAGNOSIS — N183 Chronic kidney disease, stage 3 unspecified: Secondary | ICD-10-CM

## 2016-03-15 MED ORDER — VALSARTAN 160 MG PO TABS: 160.0000 mg | ORAL_TABLET | Freq: Every day | ORAL | 0 refills | Status: DC

## 2016-03-15 NOTE — Telephone Encounter (Signed)
Patient requesting refill of Valsartan to [facility].

## 2016-03-25 ENCOUNTER — Ambulatory Visit
Admission: RE | Admit: 2016-03-25 | Discharge: 2016-03-25 | Disposition: A | Discharge status: Home / Self Care | Payer: Medicare HMO | Source: Ambulatory Visit | Attending: Family Medicine | Admitting: Family Medicine

## 2016-03-25 DIAGNOSIS — Z1231 Encounter for screening mammogram for malignant neoplasm of breast: Secondary | ICD-10-CM | POA: Insufficient documentation

## 2016-03-28 ENCOUNTER — Ambulatory Visit (INDEPENDENT_AMBULATORY_CARE_PROVIDER_SITE_OTHER): Payer: Medicare HMO | Admitting: Nurse Practitioner

## 2016-03-28 ENCOUNTER — Other Ambulatory Visit: Payer: Self-pay | Admitting: Family Medicine

## 2016-03-28 ENCOUNTER — Encounter: Payer: Self-pay | Admitting: Nurse Practitioner

## 2016-03-28 VITALS — BP 134/72 | HR 72 | Ht 63.5 in | Wt 154.0 lb

## 2016-03-28 DIAGNOSIS — N6489 Other specified disorders of breast: Secondary | ICD-10-CM

## 2016-03-28 DIAGNOSIS — N183 Chronic kidney disease, stage 3 unspecified: Secondary | ICD-10-CM

## 2016-03-28 DIAGNOSIS — E039 Hypothyroidism, unspecified: Secondary | ICD-10-CM

## 2016-03-28 DIAGNOSIS — Z01411 Encounter for gynecological examination (general) (routine) with abnormal findings: Secondary | ICD-10-CM

## 2016-03-28 DIAGNOSIS — M8588 Other specified disorders of bone density and structure, other site: Secondary | ICD-10-CM | POA: Diagnosis not present

## 2016-03-28 DIAGNOSIS — E559 Vitamin D deficiency, unspecified: Secondary | ICD-10-CM

## 2016-03-28 DIAGNOSIS — R928 Other abnormal and inconclusive findings on diagnostic imaging of breast: Secondary | ICD-10-CM

## 2016-03-28 NOTE — Patient Instructions (Addendum)

## 2016-03-28 NOTE — Progress Notes (Signed)
76 y.o. G2P0002 widowed  Caucasian Fe here for annual exam.   08/14/15 fell and fractured knee - ORIF.  She was in Rehab and did home PT and PT out patient.  She had immobilizer for 8 weeks.  She is now able to do activities at home.  Now on a new BP med's.     Patient's last menstrual period was 01/11/1987 (approximate).          Sexually active: No.  The current method of family planning is status post hysterectomy.    Exercising: continues to do PT exercises given after knee fracture surgery, unable to walk long distances Smoker:  no  Health Maintenance: Pap: 12/01/2000 normal - hysterectomy MMG:03/25/16, Bi-Rads 0: Needs further imaging Colonoscopy: 08/2008 normal, repeat in 10 yrs BMD:02/13/12 T Score: 0.5 Spine / -1.6 Right Femur Neck / -1.8 Left Femur Neck / -1.5 Left 1/3 Radius (PCP manages) TDaP: 03/03/10 Shingles: 11/28/07 Pneumonia: 12/17/13 Prevnar-13, 09/28/15 Pneumovax Hep C and HIV: Not indicated due to age Labs: PCP takes care of all labs   reports that she has never smoked. She has never used smokeless tobacco. She reports that she does not drink alcohol or use drugs.  Past Medical History:  Diagnosis Date  . Allergic rhinitis, cause unspecified   . Cancer ([place]) 10/2011   basal 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  . Cancer ([place]) 2006   melanoma on shoulder   . Chronic kidney disease, stage I   . Chronic kidney disease, stage III (moderate)   . Diverticulitis of colon (without mention of hemorrhage)(562.11)   . Migraine without aura, without mention of intractable migraine without mention of status migrainosus   . Osteoarthritis   . Osteopenia   . Other abnormal glucose   . Other and unspecified hyperlipidemia   . Postablative ovarian failure   . Pure hypercholesterolemia   . Unspecified glaucoma(365.9)   . Unspecified hypothyroidism     Past Surgical History:  Procedure Laterality Date  . APPENDECTOMY    . BASAL CELL CARCINOMA EXCISION    . BASAL CELL CARCINOMA  EXCISION  2014   nasal  . CHOLECYSTECTOMY    . COLONOSCOPY    . ORIF PATELLA Right 08/15/2015   Procedure: OPEN REDUCTION INTERNAL (ORIF) FIXATION PATELLA  and application wound vac;  Surgeon: Corky Mull, MD;  Location: [facility] OR[facility];  Service: Orthopedics;  Laterality: Right;  . TONSILLECTOMY    . VAGINAL HYSTERECTOMY      Current Outpatient Prescriptions  Medication Sig Dispense Refill  . acetaminophen (TYLENOL) 500 MG tablet Take 1,000 mg by mouth every 6 (six) hours as needed.    . ASPIRIN LOW DOSE 81 MG chewable tablet Chew 1 tablet by mouth daily.    . Calcium Polycarbophil (FIBERCON PO) Take by mouth daily.    . Cholecalciferol (VITAMIN D) 2000 units CAPS Take 2 capsules by mouth daily.     . Fish Oil-Cholecalciferol (FISH OIL + D3) 1200-1000 MG-UNIT CAPS Take 1 capsule by mouth daily.    Marland Kitchen latanoprost (XALATAN) 0.005 % ophthalmic solution PLACE 1 DROP INTO BOTH EYES AT BEDTIME  5  . levothyroxine (SYNTHROID) 88 MCG tablet Take 1 tablet (88 mcg total) by mouth daily. But one and a half on T and T Brand Only 100 tablet 0  . mometasone (NASONEX) 50 MCG/ACT nasal spray Place 2 sprays into the nose daily. 17 g 3  . Multiple Vitamin (MULTIVITAMIN) tablet Take 1 tablet by mouth daily.    . pravastatin (PRAVACHOL)  80 MG tablet Take 1 tablet (80 mg total) by mouth daily. 90 tablet 3  . ranitidine (ZANTAC) 150 MG tablet Take 1 tablet (150 mg total) by mouth 2 (two) times daily. 180 tablet 3  . topiramate (TOPAMAX) 50 MG tablet Take 1 tablet (50 mg total) by mouth daily. 90 tablet 3  . valsartan (DIOVAN) 160 MG tablet Take 1 tablet (160 mg total) by mouth daily. 90 tablet 0   No current facility-administered medications for this visit.     Family History  Problem Relation Age of Onset  . Heart attack Father     x2  . Heart disease Sister   . Heart attack Sister   . Heart attack Brother   . Heart failure Brother   . Breast cancer Neg Hx     ROS:  Pertinent items are noted in HPI.   Otherwise, a comprehensive ROS was negative.  Exam:   BP 134/72 (BP Location: Left Arm, Patient Position: Sitting, Cuff Size: Normal)   Pulse 72   Ht 5' 3.5" (1.613 m)   Wt 154 lb (69.9 kg)   LMP 01/11/1987 (Approximate)   BMI 26.85 kg/m  Height: 5' 3.5" (161.3 cm) Ht Readings from Last 3 Encounters:  03/28/16 5' 3.5" (1.613 m)  11/11/15 5\' 5"  (1.651 m)  09/28/15 5\' 5"  (1.651 m)    General appearance: alert, cooperative and appears stated age Head: Normocephalic, without obvious abnormality, atraumatic Neck: no adenopathy, supple, symmetrical, trachea midline and thyroid normal to inspection and palpation Lungs: clear to auscultation bilaterally Breasts: normal appearance, no masses or tenderness Heart: regular rate and rhythm Abdomen: soft, non-tender; no masses,  no organomegaly Extremities: extremities normal, atraumatic, no cyanosis or edema - good ROM of right knee Skin: Skin color, texture, turgor normal. No rashes or lesions Lymph nodes: Cervical, supraclavicular, and axillary nodes normal. No abnormal inguinal nodes palpated Neurologic: Grossly normal   Pelvic: External genitalia:  no lesions              Urethra:  normal appearing urethra with no masses, tenderness or lesions              Bartholin's and Skene's: normal                 Vagina: normal appearing vagina with normal color and discharge, no lesions              Cervix: absent              Pap taken: No. Bimanual Exam:  Uterus:  uterus absent              Adnexa: no mass, fullness, tenderness               Rectovaginal: Confirms               Anus:  normal sphincter tone, no lesions  Chaperone present: yes  A:  Well Woman with normal exam  S/P TAH/BSO secondary to uterine fibroids 10/1987 - on ERT until early 2014             Chronic kidney disease stage III             Hyperlipidemia             Hypothyroid             History of Migraine HA             Osteopenia hip  S/P right patella fracture  with ORIF 08/14/15 secondary to fall   P:   Reviewed health and wellness pertinent to exam  Pap smear as above  Mammogram is due 3/18 and will need further imaging  Will also try to get BMD done this fall  Recheck Vit D today.  Counseled on breast self exam, mammography screening, osteoporosis, adequate intake of calcium and vitamin D, diet and exercise, Kegel's exercises return annually or prn  An After Visit Summary was printed and given to the patient.

## 2016-03-29 LAB — VITAMIN D 25 HYDROXY (VIT D DEFICIENCY, FRACTURES): VIT D 25 HYDROXY: 62 ng/mL (ref 30–100)

## 2016-03-31 NOTE — Progress Notes (Signed)
Encounter reviewed by Dr. Aundria Rud. Dx left mammogram and left breast ultrasound scheduled for 04/01/16.

## 2016-04-01 ENCOUNTER — Ambulatory Visit
Admission: RE | Admit: 2016-04-01 | Discharge: 2016-04-01 | Disposition: A | Discharge status: Home / Self Care | Payer: Medicare HMO | Source: Ambulatory Visit | Attending: Family Medicine | Admitting: Family Medicine

## 2016-04-01 DIAGNOSIS — N6489 Other specified disorders of breast: Secondary | ICD-10-CM

## 2016-04-01 DIAGNOSIS — R928 Other abnormal and inconclusive findings on diagnostic imaging of breast: Secondary | ICD-10-CM

## 2016-04-18 DIAGNOSIS — H401131 Primary open-angle glaucoma, bilateral, mild stage: Secondary | ICD-10-CM | POA: Diagnosis not present

## 2016-05-30 DIAGNOSIS — L920 Granuloma annulare: Secondary | ICD-10-CM | POA: Diagnosis not present

## 2016-05-30 DIAGNOSIS — D225 Melanocytic nevi of trunk: Secondary | ICD-10-CM | POA: Diagnosis not present

## 2016-05-30 DIAGNOSIS — L821 Other seborrheic keratosis: Secondary | ICD-10-CM | POA: Diagnosis not present

## 2016-05-30 DIAGNOSIS — L309 Dermatitis, unspecified: Secondary | ICD-10-CM | POA: Diagnosis not present

## 2016-05-30 DIAGNOSIS — Z85820 Personal history of malignant melanoma of skin: Secondary | ICD-10-CM | POA: Diagnosis not present

## 2016-05-30 DIAGNOSIS — Z08 Encounter for follow-up examination after completed treatment for malignant neoplasm: Secondary | ICD-10-CM | POA: Diagnosis not present

## 2016-06-09 ENCOUNTER — Other Ambulatory Visit: Payer: Self-pay | Admitting: Family Medicine

## 2016-06-09 DIAGNOSIS — N183 Chronic kidney disease, stage 3 unspecified: Secondary | ICD-10-CM

## 2016-06-09 DIAGNOSIS — I10 Essential (primary) hypertension: Secondary | ICD-10-CM

## 2016-06-20 ENCOUNTER — Other Ambulatory Visit: Payer: Self-pay

## 2016-06-20 ENCOUNTER — Encounter: Payer: Self-pay | Admitting: Family Medicine

## 2016-06-20 ENCOUNTER — Ambulatory Visit (INDEPENDENT_AMBULATORY_CARE_PROVIDER_SITE_OTHER): Payer: Medicare HMO | Admitting: Family Medicine

## 2016-06-20 VITALS — BP 126/68 | HR 88 | Temp 97.7°F | Resp 16 | Ht 64.0 in | Wt 154.8 lb

## 2016-06-20 DIAGNOSIS — N183 Chronic kidney disease, stage 3 unspecified: Secondary | ICD-10-CM

## 2016-06-20 DIAGNOSIS — M533 Sacrococcygeal disorders, not elsewhere classified: Secondary | ICD-10-CM

## 2016-06-20 DIAGNOSIS — E781 Pure hyperglyceridemia: Secondary | ICD-10-CM

## 2016-06-20 DIAGNOSIS — G43009 Migraine without aura, not intractable, without status migrainosus: Secondary | ICD-10-CM | POA: Diagnosis not present

## 2016-06-20 DIAGNOSIS — E785 Hyperlipidemia, unspecified: Secondary | ICD-10-CM | POA: Diagnosis not present

## 2016-06-20 DIAGNOSIS — K219 Gastro-esophageal reflux disease without esophagitis: Secondary | ICD-10-CM | POA: Diagnosis not present

## 2016-06-20 DIAGNOSIS — M8580 Other specified disorders of bone density and structure, unspecified site: Secondary | ICD-10-CM | POA: Diagnosis not present

## 2016-06-20 DIAGNOSIS — R7309 Other abnormal glucose: Secondary | ICD-10-CM

## 2016-06-20 DIAGNOSIS — E039 Hypothyroidism, unspecified: Secondary | ICD-10-CM

## 2016-06-20 DIAGNOSIS — Z0000 Encounter for general adult medical examination without abnormal findings: Secondary | ICD-10-CM | POA: Diagnosis not present

## 2016-06-20 DIAGNOSIS — I10 Essential (primary) hypertension: Secondary | ICD-10-CM | POA: Diagnosis not present

## 2016-06-20 MED ORDER — VALSARTAN 160 MG PO TABS: 160.0000 mg | ORAL_TABLET | Freq: Every day | ORAL | 2 refills | Status: DC

## 2016-06-20 MED ORDER — VALSARTAN 160 MG PO TABS: 160.0000 mg | ORAL_TABLET | Freq: Every day | ORAL | 0 refills | Status: DC

## 2016-06-20 MED ORDER — PRAVASTATIN SODIUM 80 MG PO TABS: 80.0000 mg | ORAL_TABLET | Freq: Every day | ORAL | 3 refills | Status: DC

## 2016-06-20 MED ORDER — RANITIDINE HCL 150 MG PO TABS: 150.0000 mg | ORAL_TABLET | Freq: Two times a day (BID) | ORAL | 3 refills | Status: DC

## 2016-06-20 MED ORDER — MELOXICAM 15 MG PO TABS: 15.0000 mg | ORAL_TABLET | Freq: Every day | ORAL | 0 refills | Status: DC

## 2016-06-20 NOTE — Telephone Encounter (Signed)
Patient requesting [facility]

## 2016-06-20 NOTE — Progress Notes (Signed)
Name: Theresa Hall   MRN: [id number]    DOB: 07-29-1940   Date:06/20/2016       Progress Note  Subjective  Chief Complaint  Chief Complaint  Patient presents with  . Annual Exam    HPI  Functional ability/safety issues: No Issues Hearing issues: Addressed  Activities of daily living: Discussed Home safety issues: No Issues  End Of Life Planning: she has  advanced directives, healthcare power of attorney. She will drop off a copy for [place]  Preventative care, Health maintenance, Preventative health measures discussed.  Preventative screenings discussed today: lab work, colonoscopy,  mammogram, DEXA.  Low Dose CT Chest recommended if Age 107-80 years, 30 pack-year currently smoking OR have quit w/in 15years.   Lifestyle risk factor issued reviewed: Diet, exercise, weight management, advised patient smoking is not healthy, nutrition/diet.  Preventative health measures discussed (5-10 year plan).  Reviewed and recommended vaccinations: - Pneumovax  - Prevnar  - Annual Influenza - Zostavax - Tdap   Depression screening: Done Fall risk screening: Done Discuss ADLs/IADLs: Done  Current medical providers: See HPI  Other health risk factors identified this visit: No other issues Cognitive impairment issues: None identified  All above discussed with patient. Appropriate education, counseling and referral will be made based upon the above.   Sacroiliac joint pain: she noticed symptoms a couple of weeks ago, got worse after she tender to her knock out roses 5 days ago, painful with movement such as rolling or getting out of bed. Pain is not as intense now, but still painful to touch over right sacro-iliac joint  HTN and she has a history of CKI stage III: last labs showed GFR was back to normal. Seeing Dr. Abigail Butts yearly. BP is at goal on Diovan 160 mg  Hypothyroidism: last TSH was elevated, we adjusted dose to 88 mcg daily and one and a half T and T and she is taking brand  name and last TSH one month ago was back to normal range. She is feeling tired still, we will recheck labs  Migraine headaches: she had it for many years, and got worse when her husband was diagnosed with cancer. No symptoms  since her husband died. Pain described as like her head was going to explode and associated with nausea.Taking Topamax every other night now and symptoms have been controlled  Hyperlipidemia: taking medication and denies side effects  Patient Active Problem List   Diagnosis Date Noted  . Closed displaced comminuted fracture of right patella 08/20/2015  . Reflux esophagitis 06/23/2014  . HLD (hyperlipidemia) 06/23/2014  . Palpitations 06/23/2014  . Chronic kidney disease (CKD), stage III (moderate) 08/24/2009  . History of diverticulitis 05/14/2008  . Adult hypothyroidism 12/04/2006  . Migraine without aura 08/02/2006  . 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 APPENDECTOMY    . BASAL CELL CARCINOMA EXCISION    . BASAL CELL CARCINOMA EXCISION  2014   nasal  . CHOLECYSTECTOMY    . COLONOSCOPY    . ORIF PATELLA Right 08/15/2015   Procedure: OPEN REDUCTION INTERNAL (ORIF) FIXATION PATELLA  and application wound vac;  Surgeon: Corky Mull, MD;  Location: [facility] ORS;  Service: Orthopedics;  Laterality: Right;  . TONSILLECTOMY    . VAGINAL HYSTERECTOMY      Family History  Problem Relation Age of Onset  . Heart attack Father        x2  . Heart disease Sister   .  Heart attack Sister   . Heart attack Brother   . Heart failure Brother   . Breast cancer Neg Hx     Social History   Social History  . Marital status: Widowed    Spouse name: N/A  . Number of children: N/A  . Years of education: N/A   Occupational History  . Not on file.   Social History Main Topics  . Smoking status: Never Smoker  . Smokeless tobacco: Never Used  . Alcohol use No  . Drug use: No  . Sexual activity: No      Comment: [id number]   Other Topics Concern  . Not on file   Social History Narrative   Husband pased with melanoma 8 yrs ago,     Current Outpatient Prescriptions:  .  ASPIRIN LOW DOSE 81 MG chewable tablet, Chew 1 tablet by mouth daily., Disp: , Rfl:  .  azelastine (ASTELIN) 0.1 % nasal spray, Place 2 sprays into both nostrils 2 (two) times daily. Use in each nostril as directed, Disp: , Rfl:  .  betamethasone dipropionate (DIPROLENE) 0.05 % cream, Apply 1 Dose topically 2 (two) times daily. Tiny Amount on Right Index Finger, Disp: , Rfl:  .  Calcium Polycarbophil (FIBERCON PO), Take 1 tablet by mouth daily. , Disp: , Rfl:  .  Cholecalciferol (VITAMIN D) 2000 units tablet, Take 1 capsule by mouth daily., Disp: , Rfl:  .  Fish Oil-Cholecalciferol (FISH OIL + D3) 1200-1000 MG-UNIT CAPS, Take 1 capsule by mouth daily., Disp: , Rfl:  .  latanoprost (XALATAN) 0.005 % ophthalmic solution, PLACE 1 DROP INTO BOTH EYES AT BEDTIME, Disp: , Rfl: 5 .  levothyroxine (SYNTHROID) 88 MCG tablet, Take 1 tablet (88 mcg total) by mouth daily. But one and a half on T and T Brand Only (Patient taking differently: Take 88 mcg by mouth daily. But one and a half on Tues and Thur Brand Only), Disp: 100 tablet, Rfl: 0 .  Multiple Vitamin (MULTIVITAMIN) tablet, Take 1 tablet by mouth daily., Disp: , Rfl:  .  pravastatin (PRAVACHOL) 80 MG tablet, Take 1 tablet (80 mg total) by mouth daily., Disp: 90 tablet, Rfl: 3 .  ranitidine (ZANTAC) 150 MG tablet, Take 1 tablet (150 mg total) by mouth 2 (two) times daily., Disp: 180 tablet, Rfl: 3 .  topiramate (TOPAMAX) 50 MG tablet, Take 1 tablet (50 mg total) by mouth daily. (Patient taking differently: Take 50 mg by mouth every other day. ), Disp: 90 tablet, Rfl: 3 .  valsartan (DIOVAN) 160 MG tablet, Take 1 tablet (160 mg total) by mouth daily., Disp: 90 tablet, Rfl: 0 .  vitamin C (ASCORBIC ACID) 500 MG tablet, Take 500 mg by mouth daily., Disp: , Rfl:   Allergies  Allergen  Reactions  . Codeine Other (See Comments)    Patient had insomnia and nausea  . Oxycodone Other (See Comments)    Made her "Loopy"     ROS  Constitutional: Negative for fever or weight change.  Respiratory: Negative for cough and shortness of breath.   Cardiovascular: Negative for chest pain or palpitations.  Gastrointestinal: Negative for abdominal pain, no bowel changes.  Musculoskeletal: Negative for gait problem or joint swelling. She has intermittent right knee pain  Skin: Negative for rash.  Neurological: Negative for dizziness , positive for intermittent headache.  No other specific complaints in a complete review of systems (except as listed in HPI above).  Objective  Vitals:   06/20/16 1414  BP:  126/68  Pulse: 88  Resp: 16  Temp: 97.7 F (36.5 C)  TempSrc: Oral  SpO2: 95%  Weight: 154 lb 12.8 oz (70.2 kg)  Height: 5\' 4"  (1.626 m)    Body mass index is 26.57 kg/m.  Physical Exam  Constitutional: Patient appears well-developed and well-nourished. No distress.  HEENT: head atraumatic, normocephalic, pupils equal and reactive to light, normal TM bilaterally, neck supple, throat within normal limits Cardiovascular: Normal rate, regular rhythm and normal heart sounds.  No murmur heard. No BLE edema. Pulmonary/Chest: Effort normal and breath sounds normal. No respiratory distress. Abdominal: Soft.  There is no tenderness. Psychiatric: Patient has a normal mood and affect. behavior is normal. Judgment and thought content normal. Muscular Skeletal: normal rom of both knees. Pain during palpation of sacroiliac joint, negative radiculitis  Recent Results (from the past 2160 hour(s))  VITAMIN D 25 Hydroxy (Vit-D Deficiency, Fractures)     Status: None   Collection Time: 03/28/16  1:39 PM  Result Value Ref Range   Vit D, 25-Hydroxy 62 30 - 100 ng/mL    Comment: Vitamin D Status           25-OH Vitamin D        Deficiency                <20 ng/mL        Insufficiency          20 - 29 ng/mL        Optimal             > or = 30 ng/mL   For 25-OH Vitamin D testing on patients on D2-supplementation and patients for whom quantitation of D2 and D3 fractions is required, the QuestAssureD 25-OH VIT D, (D2,D3), LC/MS/MS is recommended: order code [id number] (patients > 2 yrs).       PHQ2/9: Depression screen [facility] 2/9 06/20/2016 12/14/2015 11/11/2015 01/06/2015  Decreased Interest 0 0 0 0  Down, Depressed, Hopeless 0 0 0 0  PHQ - 2 Score 0 0 0 0    Current Exercise Habits: Home exercise routine, Type of exercise: stretching, Time (Minutes): 10, Frequency (Times/Week): 7, Weekly Exercise (Minutes/Week): 70    Fall Risk: Fall Risk  06/20/2016 12/14/2015 11/11/2015 01/06/2015 06/23/2014  Falls in the past year? Yes Yes Yes No No  Number falls in past yr: 1 1 1  - -  Injury with Fall? Yes Yes Yes - -  Risk for fall due to : - - History of fall(s) - -  Follow up - - Falls prevention discussed - -     Functional Status Survey: Is the patient deaf or have difficulty hearing?: No Does the patient have difficulty seeing, even when wearing glasses/contacts?: No Does the patient have difficulty concentrating, remembering, or making decisions?: No Does the patient have difficulty walking or climbing stairs?: No Does the patient have difficulty dressing or bathing?: No Does the patient have difficulty doing errands alone such as visiting a doctor's office or shopping?: No    Assessment & Plan  1. Medicare annual wellness visit, subsequent  Discussed importance of 150 minutes of physical activity weekly, eat two servings of fish weekly, eat one serving of tree nuts ( cashews, pistachios, pecans, almonds.Marland Kitchen) every other day, eat 6 servings of fruit/vegetables daily and drink plenty of water and avoid sweet beverages.   2. Osteopenia, unspecified location  - DG Bone Density; Future - CBC with Differential/Platelet - Parathyroid hormone, intact (no Ca)  3.  Hypertension, benign  - COMPLETE METABOLIC PANEL WITH GFR - valsartan (DIOVAN) 160 MG tablet; Take 1 tablet (160 mg total) by mouth daily.  Dispense: 90 tablet; Refill: 2  4. Chronic kidney disease, stage III (moderate)  - COMPLETE METABOLIC PANEL WITH GFR - valsartan (DIOVAN) 160 MG tablet; Take 1 tablet (160 mg total) by mouth daily.  Dispense: 90 tablet; Refill: 2  5. Adult hypothyroidism  - TSH  6. Migraine without aura and without status migrainosus, not intractable  Doing better, weaning self off Topamax, taking one every other day  7. Elevated glucose  - Hemoglobin A1c  8. High triglycerides  - Lipid panel  9. Dyslipidemia  - pravastatin (PRAVACHOL) 80 MG tablet; Take 1 tablet (80 mg total) by mouth daily.  Dispense: 90 tablet; Refill: 3 - COMPLETE METABOLIC PANEL WITH GFR  10. GERD without esophagitis  - ranitidine (ZANTAC) 150 MG tablet; Take 1 tablet (150 mg total) by mouth 2 (two) times daily.  Dispense: 180 tablet; Refill: 3  11. Sacroiliac joint pain  Advised to take it only for one week , already improving - meloxicam (MOBIC) 15 MG tablet; Take 1 tablet (15 mg total) by mouth daily.  Dispense: 30 tablet; Refill: 0

## 2016-06-22 MED ORDER — AZELASTINE HCL 0.1 % NA SOLN: 2.0000 | Freq: Two times a day (BID) | NASAL | 2 refills | Status: DC

## 2016-06-24 DIAGNOSIS — E781 Pure hyperglyceridemia: Secondary | ICD-10-CM | POA: Diagnosis not present

## 2016-06-24 DIAGNOSIS — I10 Essential (primary) hypertension: Secondary | ICD-10-CM | POA: Diagnosis not present

## 2016-06-24 DIAGNOSIS — E039 Hypothyroidism, unspecified: Secondary | ICD-10-CM | POA: Diagnosis not present

## 2016-06-24 DIAGNOSIS — N183 Chronic kidney disease, stage 3 (moderate): Secondary | ICD-10-CM | POA: Diagnosis not present

## 2016-06-24 DIAGNOSIS — M8580 Other specified disorders of bone density and structure, unspecified site: Secondary | ICD-10-CM | POA: Diagnosis not present

## 2016-06-24 DIAGNOSIS — R7309 Other abnormal glucose: Secondary | ICD-10-CM | POA: Diagnosis not present

## 2016-06-24 DIAGNOSIS — E785 Hyperlipidemia, unspecified: Secondary | ICD-10-CM | POA: Diagnosis not present

## 2016-06-24 LAB — COMPLETE METABOLIC PANEL WITH GFR
ALT: 27 U/L (ref 6–29)
AST: 22 U/L (ref 10–35)
Albumin: 4.5 g/dL (ref 3.6–5.1)
Alkaline Phosphatase: 80 U/L (ref 33–130)
BUN: 18 mg/dL (ref 7–25)
CHLORIDE: 106 mmol/L (ref 98–110)
CO2: 25 mmol/L (ref 20–31)
CREATININE: 1.13 mg/dL — AB (ref 0.60–0.93)
Calcium: 9.7 mg/dL (ref 8.6–10.4)
GFR, Est African American: 54 mL/min — ABNORMAL LOW (ref >=60)
GFR, Est Non African American: 47 mL/min — ABNORMAL LOW (ref >=60)
GLUCOSE: 98 mg/dL (ref 65–99)
Potassium: 4.7 mmol/L (ref 3.5–5.3)
SODIUM: 140 mmol/L (ref 135–146)
Total Bilirubin: 0.4 mg/dL (ref 0.2–1.2)
Total Protein: 6.8 g/dL (ref 6.1–8.1)

## 2016-06-24 LAB — CBC WITH DIFFERENTIAL/PLATELET
BASOS PCT: 2 %
Basophils Absolute: 80 cells/uL (ref 0–200)
EOS PCT: 3 %
Eosinophils Absolute: 120 cells/uL (ref 15–500)
HCT: 40.4 % (ref 35.0–45.0)
Hemoglobin: 13.2 g/dL (ref 11.7–15.5)
LYMPHS PCT: 47 %
Lymphs Abs: 1880 cells/uL (ref 850–3900)
MCH: 30.3 pg (ref 27.0–33.0)
MCHC: 32.7 g/dL (ref 32.0–36.0)
MCV: 92.7 fL (ref 80.0–100.0)
MPV: 10.1 fL (ref 7.5–12.5)
Monocytes Absolute: 400 cells/uL (ref 200–950)
Monocytes Relative: 10 %
NEUTROS ABS: 1520 {cells}/uL (ref 1500–7800)
Neutrophils Relative %: 38 %
PLATELETS: 213 10*3/uL (ref 140–400)
RBC: 4.36 MIL/uL (ref 3.80–5.10)
RDW: 13.1 % (ref 11.0–15.0)
WBC: 4 10*3/uL (ref 3.8–10.8)

## 2016-06-24 LAB — LIPID PANEL
CHOLESTEROL: 147 mg/dL (ref <200)
HDL: 61 mg/dL (ref >50)
LDL CALC: 65 mg/dL (ref <100)
Total CHOL/HDL Ratio: 2.4 Ratio (ref <5.0)
Triglycerides: 103 mg/dL (ref <150)
VLDL: 21 mg/dL (ref <30)

## 2016-06-24 LAB — TSH: TSH: 0.74 m[IU]/L

## 2016-06-25 LAB — HEMOGLOBIN A1C
HEMOGLOBIN A1C: 5 % (ref <5.7)
Mean Plasma Glucose: 97 mg/dL

## 2016-06-27 LAB — PARATHYROID HORMONE, INTACT (NO CA): PTH: 31 pg/mL (ref 14–64)

## 2016-06-30 DIAGNOSIS — N2581 Secondary hyperparathyroidism of renal origin: Secondary | ICD-10-CM | POA: Diagnosis not present

## 2016-06-30 DIAGNOSIS — G43909 Migraine, unspecified, not intractable, without status migrainosus: Secondary | ICD-10-CM | POA: Diagnosis not present

## 2016-06-30 DIAGNOSIS — E785 Hyperlipidemia, unspecified: Secondary | ICD-10-CM | POA: Diagnosis not present

## 2016-06-30 DIAGNOSIS — I10 Essential (primary) hypertension: Secondary | ICD-10-CM | POA: Diagnosis not present

## 2016-06-30 DIAGNOSIS — D631 Anemia in chronic kidney disease: Secondary | ICD-10-CM | POA: Diagnosis not present

## 2016-06-30 DIAGNOSIS — N183 Chronic kidney disease, stage 3 (moderate): Secondary | ICD-10-CM | POA: Diagnosis not present

## 2016-07-04 DIAGNOSIS — I129 Hypertensive chronic kidney disease with stage 1 through stage 4 chronic kidney disease, or unspecified chronic kidney disease: Secondary | ICD-10-CM | POA: Diagnosis not present

## 2016-07-04 DIAGNOSIS — N183 Chronic kidney disease, stage 3 (moderate): Secondary | ICD-10-CM | POA: Diagnosis not present

## 2016-08-04 ENCOUNTER — Other Ambulatory Visit: Payer: Medicare Other

## 2016-08-15 ENCOUNTER — Ambulatory Visit
Admission: RE | Admit: 2016-08-15 | Discharge: 2016-08-15 | Disposition: A | Discharge status: Home / Self Care | Payer: Medicare HMO | Source: Ambulatory Visit | Attending: Family Medicine | Admitting: Family Medicine

## 2016-08-15 ENCOUNTER — Encounter: Payer: Self-pay | Admitting: Family Medicine

## 2016-08-15 DIAGNOSIS — Z780 Asymptomatic menopausal state: Secondary | ICD-10-CM | POA: Insufficient documentation

## 2016-08-15 DIAGNOSIS — M8588 Other specified disorders of bone density and structure, other site: Secondary | ICD-10-CM | POA: Diagnosis not present

## 2016-08-15 DIAGNOSIS — M8580 Other specified disorders of bone density and structure, unspecified site: Secondary | ICD-10-CM | POA: Diagnosis present

## 2016-08-22 ENCOUNTER — Other Ambulatory Visit: Payer: Self-pay | Admitting: Family Medicine

## 2016-08-22 NOTE — Telephone Encounter (Signed)
Patient requesting refill of Topamax to [facility].

## 2016-08-30 ENCOUNTER — Other Ambulatory Visit: Payer: Self-pay

## 2016-08-30 MED ORDER — OLMESARTAN MEDOXOMIL 20 MG PO TABS: 20.0000 mg | ORAL_TABLET | Freq: Every day | ORAL | 0 refills | Status: DC

## 2016-10-18 ENCOUNTER — Ambulatory Visit (INDEPENDENT_AMBULATORY_CARE_PROVIDER_SITE_OTHER): Payer: Medicare HMO

## 2016-10-18 DIAGNOSIS — H401131 Primary open-angle glaucoma, bilateral, mild stage: Secondary | ICD-10-CM | POA: Diagnosis not present

## 2016-10-18 DIAGNOSIS — Z23 Encounter for immunization: Secondary | ICD-10-CM | POA: Diagnosis not present

## 2016-10-25 DIAGNOSIS — H401131 Primary open-angle glaucoma, bilateral, mild stage: Secondary | ICD-10-CM | POA: Diagnosis not present

## 2016-11-18 ENCOUNTER — Other Ambulatory Visit: Payer: Self-pay | Admitting: Family Medicine

## 2016-12-20 ENCOUNTER — Ambulatory Visit: Payer: Medicare HMO | Admitting: Family Medicine

## 2017-01-16 ENCOUNTER — Encounter: Payer: Self-pay | Admitting: Family Medicine

## 2017-01-16 ENCOUNTER — Ambulatory Visit (INDEPENDENT_AMBULATORY_CARE_PROVIDER_SITE_OTHER): Payer: Medicare HMO | Admitting: Family Medicine

## 2017-01-16 DIAGNOSIS — N183 Chronic kidney disease, stage 3 unspecified: Secondary | ICD-10-CM

## 2017-01-16 DIAGNOSIS — E039 Hypothyroidism, unspecified: Secondary | ICD-10-CM

## 2017-01-16 DIAGNOSIS — E785 Hyperlipidemia, unspecified: Secondary | ICD-10-CM | POA: Diagnosis not present

## 2017-01-16 DIAGNOSIS — R5383 Other fatigue: Secondary | ICD-10-CM | POA: Diagnosis not present

## 2017-01-16 DIAGNOSIS — M8580 Other specified disorders of bone density and structure, unspecified site: Secondary | ICD-10-CM | POA: Diagnosis not present

## 2017-01-16 DIAGNOSIS — I10 Essential (primary) hypertension: Secondary | ICD-10-CM | POA: Diagnosis not present

## 2017-01-16 DIAGNOSIS — G43009 Migraine without aura, not intractable, without status migrainosus: Secondary | ICD-10-CM

## 2017-01-16 MED ORDER — ALENDRONATE SODIUM 35 MG PO TABS: 35.0000 mg | ORAL_TABLET | ORAL | 1 refills | Status: DC

## 2017-01-16 MED ORDER — IRBESARTAN 75 MG PO TABS: 75.0000 mg | ORAL_TABLET | Freq: Every day | ORAL | 0 refills | Status: DC

## 2017-01-16 NOTE — Progress Notes (Signed)
Name: Theresa Hall   MRN: [id number]    DOB: 1940/01/30   Date:01/16/2017       Progress Note  Subjective  Chief Complaint  Chief Complaint  Patient presents with  . Medication Refill    6 month F/U  . Hypothyroidism  . Hypertension    Would like to discuss concerns of Valsartan Recalls-Denies any symptoms. She could not afford Benicar and went back to Valsartan.  . Migraine    Non-existance taking medication every other day    HPI  HTN and she has a history of CKI stage III: last labs showed GFR was back to normal. Seeing Dr. Abigail Butts yearly.BP is low on medication, feeling tired, she did not fill Benicar sent because of recall of Diovan because of cost. She is still taking Diovan. We will change to Avapro.   Hypothyroidism: last TSH was elevated, we adjusted dose to 88 mcg daily and one and a half T and T and one daily other days of the week, she is taking brand name and last TSH was back to normal range. She is feeling tired still, we will recheck labs  Migraine headaches: she had it for many years, and got worse when her husband was diagnosed with cancer. No symptoms since her husband died. Pain described as like her head was going to explode and associated with nausea.Taking Topamax every other night now and symptoms have been controlled, no side effect of medication  Hyperlipidemia: taking medication and denies side effects  Osteopenia: discussed results, discussed high calcium and vitamin D diet and she agrees on taking Fosamax at a lower dose.   GERD: she states symptoms are intermittent, usually at night, more like a burning sensation at night. Discussed raising the head of the bed.   Patient Active Problem List   Diagnosis Date Noted  . Osteopenia 08/15/2016  . Closed displaced comminuted fracture of right patella 08/20/2015  . Reflux esophagitis 06/23/2014  . HLD (hyperlipidemia) 06/23/2014  . Palpitations 06/23/2014  . Chronic kidney disease (CKD), stage III  (moderate) ([place]) 08/24/2009  . History of diverticulitis 05/14/2008  . Adult hypothyroidism 12/04/2006  . Migraine without aura 08/02/2006  . 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 APPENDECTOMY    . BASAL CELL CARCINOMA EXCISION    . BASAL CELL CARCINOMA EXCISION  2014   nasal  . CHOLECYSTECTOMY    . COLONOSCOPY    . ORIF PATELLA Right 08/15/2015   Procedure: OPEN REDUCTION INTERNAL (ORIF) FIXATION PATELLA  and application wound vac;  Surgeon: Corky Mull, MD;  Location: [facility] ORS;  Service: Orthopedics;  Laterality: Right;  . TONSILLECTOMY    . VAGINAL HYSTERECTOMY      Family History  Problem Relation Age of Onset  . Heart attack Father        x2  . Heart disease Sister   . Heart attack Sister   . Heart attack Brother   . Heart failure Brother   . Breast cancer Neg Hx     Social History   Socioeconomic History  . Marital status: Widowed    Spouse name: Not on file  . Number of children: 2  . Years of education: Not on file  . Highest education level: Not on file  Social Needs  . Financial resource strain: Not hard at all  . Food insecurity - worry: Never true  . Food insecurity - inability: Never true  . Transportation needs -  medical: No  . Transportation needs - non-medical: No  Occupational History  . Not on file  Tobacco Use  . Smoking status: Never Smoker  . Smokeless tobacco: Never Used  Substance and Sexual Activity  . Alcohol use: No  . Drug use: No  . Sexual activity: No    Birth control/protection: Surgical    Comment: [id number]  Other Topics Concern  . Not on file  Social History Narrative   She has been a widow since 2009     Current Outpatient Medications:  .  ASPIRIN LOW DOSE 81 MG chewable tablet, Chew 1 tablet by mouth daily., Disp: , Rfl:  .  azelastine (ASTELIN) 0.1 % nasal spray, Place 2 sprays into both nostrils 2 (two) times daily. Use in each nostril as directed, Disp: 30  mL, Rfl: 2 .  Calcium Polycarbophil (FIBERCON PO), Take 1 tablet by mouth daily. , Disp: , Rfl:  .  cholecalciferol (VITAMIN D) 1000 units tablet, Take 1 capsule by mouth daily., Disp: , Rfl:  .  Fish Oil-Cholecalciferol (FISH OIL + D3) 1200-1000 MG-UNIT CAPS, Take 1 capsule by mouth daily., Disp: , Rfl:  .  latanoprost (XALATAN) 0.005 % ophthalmic solution, PLACE 1 DROP INTO BOTH EYES AT BEDTIME, Disp: , Rfl: 5 .  Multiple Vitamin (MULTIVITAMIN) tablet, Take 1 tablet by mouth daily., Disp: , Rfl:  .  pravastatin (PRAVACHOL) 80 MG tablet, Take 1 tablet (80 mg total) by mouth daily., Disp: 90 tablet, Rfl: 3 .  ranitidine (ZANTAC) 150 MG tablet, Take 1 tablet (150 mg total) by mouth 2 (two) times daily., Disp: 180 tablet, Rfl: 3 .  SYNTHROID 88 MCG tablet, TAKE 1 TABLET BY MOUTH DAILY. TAKE 1 AND 1/2 TABLES ON TUESDAYS AND THURSDAYS, Disp: 100 tablet, Rfl: 0 .  topiramate (TOPAMAX) 50 MG tablet, TAKE 1 TABLET BY MOUTH EVERY DAY (Patient taking differently: TAKE 1 TABLET BY MOUTH EVERY OTHER DAY), Disp: 90 tablet, Rfl: 1 .  vitamin C (ASCORBIC ACID) 500 MG tablet, Take 500 mg by mouth daily., Disp: , Rfl:  .  irbesartan (AVAPRO) 75 MG tablet, Take 1 tablet (75 mg total) by mouth daily., Disp: 90 tablet, Rfl: 0  Allergies  Allergen Reactions  . Codeine Other (See Comments)    Patient had insomnia and nausea  . Oxycodone Other (See Comments)    Made her "Loopy"     ROS  Constitutional: Negative for fever or weight change.  Respiratory: Negative for cough and shortness of breath.   Cardiovascular: Negative for chest pain or palpitations.  Gastrointestinal: Negative for abdominal pain, no bowel changes.  Musculoskeletal: Negative for gait problem and no  joint swelling.  Skin: Negative for rash.  Neurological: Negative for dizziness , positive for intermittent headache.  No other specific complaints in a complete review of systems (except as listed in HPI above).  Objective  Vitals:    01/16/17 1519  BP: 116/64  Pulse: 78  Resp: 18  Temp: 98.2 F (36.8 C)  TempSrc: Oral  SpO2: 98%  Weight: 159 lb 4.8 oz (72.3 kg)  Height: 5\' 4"  (1.626 m)    Body mass index is 27.34 kg/m.  Physical Exam  Constitutional: Patient appears well-developed and well-nourished. Overweight  No distress.  HEENT: head atraumatic, normocephalic, pupils equal and reactive to light,  neck supple, throat within normal limits Cardiovascular: Normal rate, regular rhythm and normal heart sounds.  No murmur heard. No BLE edema. Pulmonary/Chest: Effort normal and breath sounds normal. No respiratory distress.  Abdominal: Soft.  There is no tenderness. Psychiatric: Patient has a normal mood and affect. behavior is normal. Judgment and thought content normal.  PHQ2/9: Depression screen [facility] 2/9 01/16/2017 06/20/2016 12/14/2015 11/11/2015 01/06/2015  Decreased Interest 0 0 0 0 0  Down, Depressed, Hopeless 0 0 0 0 0  PHQ - 2 Score 0 0 0 0 0     Fall Risk: Fall Risk  01/16/2017 06/20/2016 12/14/2015 11/11/2015 01/06/2015  Falls in the past year? No Yes Yes Yes No  Comment - Accident slipped on water on gym floor - - -  Number falls in past yr: - 1 1 1  -  Injury with Fall? - Yes Yes Yes -  Comment - - - slipped on a gym floor and fracture right patella -  Risk for fall due to : - - - History of fall(s) -  Follow up - - - Falls prevention discussed -      Functional Status Survey: Is the patient deaf or have difficulty hearing?: No Does the patient have difficulty seeing, even when wearing glasses/contacts?: No Does the patient have difficulty concentrating, remembering, or making decisions?: No Does the patient have difficulty walking or climbing stairs?: No Does the patient have difficulty dressing or bathing?: No Does the patient have difficulty doing errands alone such as visiting a doctor's office or shopping?: No    Assessment & Plan  1. Stage 3 chronic kidney disease (HCC)  - irbesartan  (AVAPRO) 75 MG tablet; Take 1 tablet (75 mg total) by mouth daily.  Dispense: 90 tablet; Refill: 0 - VITAMIN D 25 Hydroxy (Vit-D Deficiency, Fractures) - COMPLETE METABOLIC PANEL WITH GFR - CBC with Differential/Platelet  2. Other fatigue  - Vitamin B12 - VITAMIN D 25 Hydroxy (Vit-D Deficiency, Fractures) - COMPLETE METABOLIC PANEL WITH GFR - CBC with Differential/Platelet  3. Adult hypothyroidism  - TSH  4. Migraine without aura and without status migrainosus, not intractable  Doing well on Topamax every other day   5. Hypertension, benign  - irbesartan (AVAPRO) 75 MG tablet; Take 1 tablet (75 mg total) by mouth daily.  Dispense: 90 tablet; Refill: 0  6. Osteopenia, unspecified location  FRAX : 23.2% for total fracture risk             Hip fracture 6.5%   Discussed medication and she is willing to try  Fosamax 35 mg weekly, discussed possible side effects  7. Dyslipidemia  On statin therapy

## 2017-01-17 LAB — COMPLETE METABOLIC PANEL WITH GFR
AG Ratio: 1.9 (calc) (ref 1.0–2.5)
ALBUMIN MSPROF: 4.7 g/dL (ref 3.6–5.1)
ALKALINE PHOSPHATASE (APISO): 71 U/L (ref 33–130)
ALT: 24 U/L (ref 6–29)
AST: 21 U/L (ref 10–35)
BILIRUBIN TOTAL: 0.4 mg/dL (ref 0.2–1.2)
BUN/Creatinine Ratio: 18 (calc) (ref 6–22)
BUN: 19 mg/dL (ref 7–25)
CHLORIDE: 104 mmol/L (ref 98–110)
CO2: 25 mmol/L (ref 20–32)
CREATININE: 1.08 mg/dL — AB (ref 0.60–0.93)
Calcium: 10 mg/dL (ref 8.6–10.4)
GFR, Est African American: 57 mL/min/{1.73_m2} — ABNORMAL LOW (ref >=60)
GFR, Est Non African American: 49 mL/min/{1.73_m2} — ABNORMAL LOW (ref >=60)
GLOBULIN: 2.5 g/dL (ref 1.9–3.7)
GLUCOSE: 92 mg/dL (ref 65–99)
Potassium: 3.9 mmol/L (ref 3.5–5.3)
SODIUM: 138 mmol/L (ref 135–146)
Total Protein: 7.2 g/dL (ref 6.1–8.1)

## 2017-01-17 LAB — CBC WITH DIFFERENTIAL/PLATELET
BASOS PCT: 1.2 %
Basophils Absolute: 73 cells/uL (ref 0–200)
EOS PCT: 2.6 %
Eosinophils Absolute: 159 cells/uL (ref 15–500)
HCT: 37 % (ref 35.0–45.0)
Hemoglobin: 12.9 g/dL (ref 11.7–15.5)
Lymphs Abs: 2556 cells/uL (ref 850–3900)
MCH: 31.8 pg (ref 27.0–33.0)
MCHC: 34.9 g/dL (ref 32.0–36.0)
MCV: 91.1 fL (ref 80.0–100.0)
MONOS PCT: 7.8 %
MPV: 11 fL (ref 7.5–12.5)
Neutro Abs: 2837 cells/uL (ref 1500–7800)
Neutrophils Relative %: 46.5 %
PLATELETS: 218 10*3/uL (ref 140–400)
RBC: 4.06 10*6/uL (ref 3.80–5.10)
RDW: 12.1 % (ref 11.0–15.0)
Total Lymphocyte: 41.9 %
WBC: 6.1 10*3/uL (ref 3.8–10.8)
WBCMIX: 476 {cells}/uL (ref 200–950)

## 2017-01-17 LAB — TSH: TSH: 3.62 m[IU]/L (ref 0.40–4.50)

## 2017-01-17 LAB — VITAMIN D 25 HYDROXY (VIT D DEFICIENCY, FRACTURES): Vit D, 25-Hydroxy: 47 ng/mL (ref 30–100)

## 2017-01-17 LAB — VITAMIN B12: VITAMIN B 12: 1018 pg/mL (ref 200–1100)

## 2017-02-14 ENCOUNTER — Other Ambulatory Visit: Payer: Self-pay

## 2017-02-14 NOTE — Telephone Encounter (Signed)
Refill request for thyroid medication. [facility].   Last Physical:  Lab Results  Component Value Date   TSH 3.62 01/16/2017    Follow up on 07/18/17

## 2017-02-15 ENCOUNTER — Other Ambulatory Visit: Payer: Self-pay

## 2017-02-15 MED ORDER — SYNTHROID 88 MCG PO TABS: ORAL_TABLET | ORAL | 0 refills | Status: DC

## 2017-03-06 ENCOUNTER — Other Ambulatory Visit: Payer: Self-pay

## 2017-03-06 MED ORDER — TOPIRAMATE 50 MG PO TABS: 50.0000 mg | ORAL_TABLET | Freq: Every evening | ORAL | 1 refills | Status: DC

## 2017-03-06 NOTE — Telephone Encounter (Signed)
Refill request for general medication: Topamax 50 mg  Last office visit: 01/16/2017  Last physical exam: 06/20/2016  Follow-up on file. 07/18/2017

## 2017-04-03 ENCOUNTER — Other Ambulatory Visit: Payer: Self-pay

## 2017-04-03 MED ORDER — AZELASTINE HCL 0.1 % NA SOLN: 2.0000 | Freq: Two times a day (BID) | NASAL | 2 refills | Status: DC

## 2017-04-03 NOTE — Telephone Encounter (Signed)
Refill request for general medication. Azelastine [facility] with a [facility] day supply.   Last office visit: 01/16/2017   Follow up on 07/18/2017

## 2017-04-18 ENCOUNTER — Other Ambulatory Visit: Payer: Self-pay | Admitting: Family Medicine

## 2017-04-18 DIAGNOSIS — N183 Chronic kidney disease, stage 3 unspecified: Secondary | ICD-10-CM

## 2017-04-18 DIAGNOSIS — I10 Essential (primary) hypertension: Secondary | ICD-10-CM

## 2017-04-18 NOTE — Telephone Encounter (Signed)
Refill request for Hypertension medication:  Irbesartan 75 mg  Last office visit pertaining to hypertension: 01/16/2017  BP Readings from Last 3 Encounters:  01/16/17 116/64  06/20/16 126/68  03/28/16 134/72     Lab Results  Component Value Date   CREATININE 1.08 (H) 01/16/2017   BUN 19 01/16/2017   NA 138 01/16/2017   K 3.9 01/16/2017   CL 104 01/16/2017   CO2 25 01/16/2017    Follow-ups on file. 07/18/2017

## 2017-04-24 DIAGNOSIS — H401131 Primary open-angle glaucoma, bilateral, mild stage: Secondary | ICD-10-CM | POA: Diagnosis not present

## 2017-05-15 ENCOUNTER — Other Ambulatory Visit: Payer: Self-pay | Admitting: Family Medicine

## 2017-07-05 DIAGNOSIS — N183 Chronic kidney disease, stage 3 (moderate): Secondary | ICD-10-CM | POA: Diagnosis not present

## 2017-07-05 DIAGNOSIS — I10 Essential (primary) hypertension: Secondary | ICD-10-CM | POA: Diagnosis not present

## 2017-07-05 DIAGNOSIS — D631 Anemia in chronic kidney disease: Secondary | ICD-10-CM | POA: Diagnosis not present

## 2017-07-05 DIAGNOSIS — N2581 Secondary hyperparathyroidism of renal origin: Secondary | ICD-10-CM | POA: Diagnosis not present

## 2017-07-05 DIAGNOSIS — G43909 Migraine, unspecified, not intractable, without status migrainosus: Secondary | ICD-10-CM | POA: Diagnosis not present

## 2017-07-05 DIAGNOSIS — E785 Hyperlipidemia, unspecified: Secondary | ICD-10-CM | POA: Diagnosis not present

## 2017-07-05 LAB — CBC AND DIFFERENTIAL
Hemoglobin: 13.7 (ref 12.0–16.0)
Platelets: 189 (ref 150–399)

## 2017-07-05 LAB — BASIC METABOLIC PANEL
BUN: 22 — AB (ref 4–21)
CREATININE: 1.2 — AB (ref 0.5–1.1)
POTASSIUM: 4.2 (ref 3.4–5.3)
SODIUM: 137 (ref 137–147)

## 2017-07-05 LAB — MICROALBUMIN, URINE: MICROALB UR: NORMAL

## 2017-07-05 LAB — VITAMIN D 25 HYDROXY (VIT D DEFICIENCY, FRACTURES): Vit D, 25-Hydroxy: 60

## 2017-07-10 DIAGNOSIS — N183 Chronic kidney disease, stage 3 (moderate): Secondary | ICD-10-CM | POA: Diagnosis not present

## 2017-07-10 DIAGNOSIS — N2581 Secondary hyperparathyroidism of renal origin: Secondary | ICD-10-CM | POA: Diagnosis not present

## 2017-07-10 DIAGNOSIS — I129 Hypertensive chronic kidney disease with stage 1 through stage 4 chronic kidney disease, or unspecified chronic kidney disease: Secondary | ICD-10-CM | POA: Diagnosis not present

## 2017-07-11 ENCOUNTER — Other Ambulatory Visit: Payer: Self-pay | Admitting: Family Medicine

## 2017-07-11 DIAGNOSIS — M8580 Other specified disorders of bone density and structure, unspecified site: Secondary | ICD-10-CM

## 2017-07-12 NOTE — Telephone Encounter (Signed)
Refill request for general medication: Fosamax 35 mg  Last office visit: 01/07/219  Last physical exam: 06/20/2016 Medicare  Follow-ups on file. 07/18/2017

## 2017-07-17 ENCOUNTER — Other Ambulatory Visit: Payer: Self-pay | Admitting: Family Medicine

## 2017-07-17 DIAGNOSIS — Z1231 Encounter for screening mammogram for malignant neoplasm of breast: Secondary | ICD-10-CM

## 2017-07-18 ENCOUNTER — Ambulatory Visit (INDEPENDENT_AMBULATORY_CARE_PROVIDER_SITE_OTHER): Payer: Medicare HMO | Admitting: Family Medicine

## 2017-07-18 ENCOUNTER — Ambulatory Visit (INDEPENDENT_AMBULATORY_CARE_PROVIDER_SITE_OTHER): Payer: Medicare HMO

## 2017-07-18 ENCOUNTER — Encounter: Payer: Self-pay | Admitting: Family Medicine

## 2017-07-18 VITALS — BP 140/72 | HR 83 | Temp 98.7°F | Resp 12 | Ht 64.0 in | Wt 157.2 lb

## 2017-07-18 VITALS — BP 140/70 | HR 83 | Temp 98.7°F | Resp 16 | Ht 64.0 in | Wt 157.2 lb

## 2017-07-18 DIAGNOSIS — I10 Essential (primary) hypertension: Secondary | ICD-10-CM

## 2017-07-18 DIAGNOSIS — K219 Gastro-esophageal reflux disease without esophagitis: Secondary | ICD-10-CM | POA: Diagnosis not present

## 2017-07-18 DIAGNOSIS — N2581 Secondary hyperparathyroidism of renal origin: Secondary | ICD-10-CM

## 2017-07-18 DIAGNOSIS — N183 Chronic kidney disease, stage 3 unspecified: Secondary | ICD-10-CM

## 2017-07-18 DIAGNOSIS — E785 Hyperlipidemia, unspecified: Secondary | ICD-10-CM

## 2017-07-18 DIAGNOSIS — E039 Hypothyroidism, unspecified: Secondary | ICD-10-CM | POA: Diagnosis not present

## 2017-07-18 DIAGNOSIS — Z0000 Encounter for general adult medical examination without abnormal findings: Secondary | ICD-10-CM | POA: Diagnosis not present

## 2017-07-18 DIAGNOSIS — R7309 Other abnormal glucose: Secondary | ICD-10-CM

## 2017-07-18 DIAGNOSIS — E781 Pure hyperglyceridemia: Secondary | ICD-10-CM

## 2017-07-18 MED ORDER — IRBESARTAN 75 MG PO TABS: 75.0000 mg | ORAL_TABLET | Freq: Every day | ORAL | 1 refills | Status: DC

## 2017-07-18 MED ORDER — RANITIDINE HCL 150 MG PO TABS: 150.0000 mg | ORAL_TABLET | Freq: Two times a day (BID) | ORAL | 3 refills | Status: DC

## 2017-07-18 MED ORDER — PRAVASTATIN SODIUM 80 MG PO TABS: 80.0000 mg | ORAL_TABLET | Freq: Every day | ORAL | 3 refills | Status: DC

## 2017-07-18 NOTE — Patient Instructions (Addendum)
Theresa Hall , Thank you for taking time to come for your Medicare Wellness Visit. I appreciate your ongoing commitment to your health goals. Please review the following plan we discussed and let me know if I can assist you in the future.   Screening recommendations/referrals: Colorectal Screening: No longer required Mammogram: Please keep your appointment as scheduled Bone Density: Up to date Lung Cancer Screening: You do not qualify for this screening Hepatitis C Screening: You do not qualify for this screening  Vision and Dental Exams: Recommended annual ophthalmology exams for early detection of glaucoma and other disorders of the eye Recommended annual dental exams for proper oral hygiene  Vaccinations: Influenza vaccine: Up to date Pneumococcal vaccine: Up to date Tdap vaccine: Up to date Shingles vaccine: Please call your insurance company to determine your out of pocket expense for the Shingrix vaccine. You may also receive this vaccine at your local pharmacy or Health Dept.   Advanced directives: Please bring a copy of your POA (Power of Attorney) and/or Living Will to your next appointment.  Goals: Recommend to drink at least 6-8 8oz glasses of water per day.  Next appointment: Please schedule your Annual Wellness Visit with your Nurse Health Advisor in one year.  Preventive Care 11 Years and Older, Female Preventive care refers to lifestyle choices and visits with your health care provider that can promote health and wellness. What does preventive care include?  A yearly physical exam. This is also called an annual well check.  Dental exams once or twice a year.  Routine eye exams. Ask your health care provider how often you should have your eyes checked.  Personal lifestyle choices, including:  Daily care of your teeth and gums.  Regular physical activity.  Eating a healthy diet.  Avoiding tobacco and drug use.  Limiting alcohol use.  Practicing safe  sex.  Taking low-dose aspirin every day.  Taking vitamin and mineral supplements as recommended by your health care provider. What happens during an annual well check? The services and screenings done by your health care provider during your annual well check will depend on your age, overall health, lifestyle risk factors, and family history of disease. Counseling  Your health care provider may ask you questions about your:  Alcohol use.  Tobacco use.  Drug use.  Emotional well-being.  Home and relationship well-being.  Sexual activity.  Eating habits.  History of falls.  Memory and ability to understand (cognition).  Work and work Statistician.  Reproductive health. Screening  You may have the following tests or measurements:  Height, weight, and BMI.  Blood pressure.  Lipid and cholesterol levels. These may be checked every 5 years, or more frequently if you are over 51 years old.  Skin check.  Lung cancer screening. You may have this screening every year starting at age 63 if you have a 30-pack-year history of smoking and currently smoke or have quit within the past 15 years.  Fecal occult blood test (FOBT) of the stool. You may have this test every year starting at age 3.  Flexible sigmoidoscopy or colonoscopy. You may have a sigmoidoscopy every 5 years or a colonoscopy every 10 years starting at age 108.  Hepatitis C blood test.  Hepatitis B blood test.  Sexually transmitted disease (STD) testing.  Diabetes screening. This is done by checking your blood sugar (glucose) after you have not eaten for a while (fasting). You may have this done every 1-3 years.  Bone density scan. This is  done to screen for osteoporosis. You may have this done starting at age 24.  Mammogram. This may be done every 1-2 years. Talk to your health care provider about how often you should have regular mammograms. Talk with your health care provider about your test results,  treatment options, and if necessary, the need for more tests. Vaccines  Your health care provider may recommend certain vaccines, such as:  Influenza vaccine. This is recommended every year.  Tetanus, diphtheria, and acellular pertussis (Tdap, Td) vaccine. You may need a Td booster every 10 years.  Zoster vaccine. You may need this after age 22.  Pneumococcal 13-valent conjugate (PCV13) vaccine. One dose is recommended after age 46.  Pneumococcal polysaccharide (PPSV23) vaccine. One dose is recommended after age 56. Talk to your health care provider about which screenings and vaccines you need and how often you need them. This information is not intended to replace advice given to you by your health care provider. Make sure you discuss any questions you have with your health care provider. Document Released: 01/23/2015 Document Revised: 09/16/2015 Document Reviewed: 10/28/2014 Elsevier Interactive Patient Education  2017 [place] Prevention in the Home Falls can cause injuries. They can happen to people of all ages. There are many things you can do to make your home safe and to help prevent falls. What can I do on the outside of my home?  Regularly fix the edges of walkways and driveways and fix any cracks.  Remove anything that might make you trip as you walk through a door, such as a raised step or threshold.  Trim any bushes or trees on the path to your home.  Use bright outdoor lighting.  Clear any walking paths of anything that might make someone trip, such as rocks or tools.  Regularly check to see if handrails are loose or broken. Make sure that both sides of any steps have handrails.  Any raised decks and porches should have guardrails on the edges.  Have any leaves, snow, or ice cleared regularly.  Use sand or salt on walking paths during winter.  Clean up any spills in your garage right away. This includes oil or grease spills. What can I do in the  bathroom?  Use night lights.  Install grab bars by the toilet and in the tub and shower. Do not use towel bars as grab bars.  Use non-skid mats or decals in the tub or shower.  If you need to sit down in the shower, use a plastic, non-slip stool.  Keep the floor dry. Clean up any water that spills on the floor as soon as it happens.  Remove soap buildup in the tub or shower regularly.  Attach bath mats securely with double-sided non-slip rug tape.  Do not have throw rugs and other things on the floor that can make you trip. What can I do in the bedroom?  Use night lights.  Make sure that you have a light by your bed that is easy to reach.  Do not use any sheets or blankets that are too big for your bed. They should not hang down onto the floor.  Have a firm chair that has side arms. You can use this for support while you get dressed.  Do not have throw rugs and other things on the floor that can make you trip. What can I do in the kitchen?  Clean up any spills right away.  Avoid walking on wet floors.  Keep  items that you use a lot in easy-to-reach places.  If you need to reach something above you, use a strong step stool that has a grab bar.  Keep electrical cords out of the way.  Do not use floor polish or wax that makes floors slippery. If you must use wax, use non-skid floor wax.  Do not have throw rugs and other things on the floor that can make you trip. What can I do with my stairs?  Do not leave any items on the stairs.  Make sure that there are handrails on both sides of the stairs and use them. Fix handrails that are broken or loose. Make sure that handrails are as long as the stairways.  Check any carpeting to make sure that it is firmly attached to the stairs. Fix any carpet that is loose or worn.  Avoid having throw rugs at the top or bottom of the stairs. If you do have throw rugs, attach them to the floor with carpet tape.  Make sure that you have a  light switch at the top of the stairs and the bottom of the stairs. If you do not have them, ask someone to add them for you. What else can I do to help prevent falls?  Wear shoes that:  Do not have high heels.  Have rubber bottoms.  Are comfortable and fit you well.  Are closed at the toe. Do not wear sandals.  If you use a stepladder:  Make sure that it is fully opened. Do not climb a closed stepladder.  Make sure that both sides of the stepladder are locked into place.  Ask someone to hold it for you, if possible.  Clearly mark and make sure that you can see:  Any grab bars or handrails.  First and last steps.  Where the edge of each step is.  Use tools that help you move around (mobility aids) if they are needed. These include:  Canes.  Walkers.  Scooters.  Crutches.  Turn on the lights when you go into a dark area. Replace any light bulbs as soon as they burn out.  Set up your furniture so you have a clear path. Avoid moving your furniture around.  If any of your floors are uneven, fix them.  If there are any pets around you, be aware of where they are.  Review your medicines with your doctor. Some medicines can make you feel dizzy. This can increase your chance of falling. Ask your doctor what other things that you can do to help prevent falls. This information is not intended to replace advice given to you by your health care provider. Make sure you discuss any questions you have with your health care provider. Document Released: 10/23/2008 Document Revised: 06/04/2015 Document Reviewed: 01/31/2014 Elsevier Interactive Patient Education  2017 [employer].

## 2017-07-18 NOTE — Progress Notes (Addendum)
Subjective:   Theresa Hall is a 77 y.o. female who presents for Medicare Annual (Subsequent) preventive examination.  Review of Systems:  N/A Cardiac Risk Factors include: advanced age (>24men, >71 women);dyslipidemia;sedentary lifestyle     Objective:     Vitals: BP 140/72 (BP Location: Left Arm, Patient Position: Sitting, Cuff Size: Normal)   Pulse 83   Temp 98.7 F (37.1 C) (Oral)   Resp 12   Ht 5\' 4"  (1.626 m)   Wt 157 lb 3.2 oz (71.3 kg)   LMP 01/11/1987 (Approximate)   SpO2 97%   BMI 26.98 kg/m   Body mass index is 26.98 kg/m.  Advanced Directives 07/18/2017 06/20/2016 11/11/2015 08/15/2015 08/14/2015 06/22/2015 01/06/2015  Does Patient Have a Medical Advance Directive? Yes Yes Yes Yes Yes Yes Yes  Type of Paramedic of [place];Living will [place];Living will [place];Living will Living will Living will [place];Living will [place];Living will  Does patient want to make changes to medical advance directive? - - No - Patient declined - - No - Patient declined No - Patient declined  Copy of [place] in Chart? No - copy requested No - copy requested No - copy requested No - copy requested - No - copy requested No - copy requested    Tobacco Social History   Tobacco Use  Smoking Status Never Smoker  Smokeless Tobacco Never Used  Tobacco Comment   smoking cessation materials not required     Counseling given: No Comment: smoking cessation materials not required  Clinical Intake:  Pre-visit preparation completed: Yes  Pain : No/denies pain   BMI - recorded: 26.98 Nutritional Status: BMI 25 -29 Overweight Nutritional Risks: None Diabetes: No  How often do you need to have someone help you when you read instructions, pamphlets, or other written materials from your doctor or pharmacy?: 1 - Never  Interpreter Needed?: No  Information  entered by :: AEversole, LPN  Past Medical History:  Diagnosis Date  . Allergic rhinitis, cause unspecified   . 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 Cancer ([facility]) 2006   melanoma on shoulder   . Chronic kidney disease, stage I   . Chronic kidney disease, stage III (moderate) (HCC)   . Diverticulitis of colon (without mention of hemorrhage)(562.11)   . Migraine without aura, without mention of intractable migraine without mention of status migrainosus   . Osteoarthritis   . Osteopenia   . Other abnormal glucose   . Other and unspecified hyperlipidemia   . Postablative ovarian failure   . Pure hypercholesterolemia   . Unspecified glaucoma(365.9)   . Unspecified hypothyroidism    Past Surgical History:  Procedure Laterality Date  . APPENDECTOMY    . BASAL CELL CARCINOMA EXCISION    . BASAL CELL CARCINOMA EXCISION  2014   nasal  . CHOLECYSTECTOMY    . COLONOSCOPY    . ORIF PATELLA Right 08/15/2015   Procedure: OPEN REDUCTION INTERNAL (ORIF) FIXATION PATELLA  and application wound vac;  Surgeon: Corky Mull, MD;  Location: [facility] ORS;  Service: Orthopedics;  Laterality: Right;  . TONSILLECTOMY    . VAGINAL HYSTERECTOMY     Family History  Problem Relation Age of Onset  . Heart attack Father        x2  . Heart disease Sister   . Kidney disease Sister   . Diabetes Sister   .  Heart attack Brother   . Diabetes Brother   . Heart attack Brother   . Breast cancer Neg Hx    Social History   Socioeconomic History  . Marital status: Widowed    Spouse name: Gershon Mussel  . Number of children: 2  . Years of education: some college  . Highest education level: 12th grade  Occupational History  . Occupation: Retired  Scientific laboratory technician  . Financial resource strain: Not hard at all  . Food insecurity:    Worry: Never true    Inability: Never true  . Transportation needs:    Medical: No    Non-medical: No  Tobacco Use  . Smoking status: Never Smoker  . Smokeless tobacco:  Never Used  . Tobacco comment: smoking cessation materials not required  Substance and Sexual Activity  . Alcohol use: No  . Drug use: No  . Sexual activity: Never    Birth control/protection: Surgical    Comment: TAH-1989  Lifestyle  . Physical activity:    Days per week: 0 days    Minutes per session: 0 min  . Stress: Not at all  Relationships  . Social connections:    Talks on phone: More than three times a week    Gets together: Three times a week    Attends religious service: More than 4 times per year    Active member of club or organization: Yes    Attends meetings of clubs or organizations: More than 4 times per year    Relationship status: Widowed  Other Topics Concern  . Not on file  Social History Narrative   She has been a widow since 2009    Outpatient Encounter Medications as of 07/18/2017  Medication Sig  . alendronate (FOSAMAX) 35 MG tablet TAKE 1 TABLET BY MOUTH EVERY 7 DAYS. TAKE WITH WATER ON AN EMPTY STOMACH.  Marland Kitchen ASPIRIN LOW DOSE 81 MG tablet Take 1 tablet by mouth daily.   Marland Kitchen azelastine (ASTELIN) 0.1 % nasal spray Place 2 sprays into both nostrils 2 (two) times daily. Use in each nostril as directed (Patient taking differently: Place 2 sprays into both nostrils daily. Use in each nostril as directed )  . Calcium Polycarbophil (FIBERCON PO) Take 1 tablet by mouth daily.   . cholecalciferol (VITAMIN D) 1000 units tablet Take 1 capsule by mouth daily.  . Fish Oil-Cholecalciferol (FISH OIL + D3) 1200-1000 MG-UNIT CAPS Take 1 capsule by mouth daily.  . irbesartan (AVAPRO) 75 MG tablet TAKE 1 TABLET BY MOUTH EVERY DAY  . latanoprost (XALATAN) 0.005 % ophthalmic solution PLACE 1 DROP INTO BOTH EYES AT BEDTIME  . Multiple Vitamin (MULTIVITAMIN) tablet Take 1 tablet by mouth daily.  . pravastatin (PRAVACHOL) 80 MG tablet Take 1 tablet (80 mg total) by mouth daily.  . ranitidine (ZANTAC) 150 MG tablet Take 1 tablet (150 mg total) by mouth 2 (two) times daily.  Marland Kitchen  SYNTHROID 88 MCG tablet TAKE 1 TABLET BY MOUTH DAILY. TAKE 1 AND 1/2 TABLES ON TUESDAYS AND THURSDAYS  . topiramate (TOPAMAX) 50 MG tablet Take 1 tablet (50 mg total) by mouth every evening. (Patient taking differently: Take 50 mg by mouth every other day. )  . vitamin C (ASCORBIC ACID) 500 MG tablet Take 500 mg by mouth daily.   No facility-administered encounter medications on file as of 07/18/2017.     Activities of Daily Living In your present state of health, do you have any difficulty performing the following activities: 07/18/2017 07/18/2017  Hearing? N N  Comment - denies hearing aids  Vision? Y N  Comment - wears eyeglasses; glaucoma and B cataracts  Difficulty concentrating or making decisions? N N  Walking or climbing stairs? N Y  Comment - knee pain, dyspnea  Dressing or bathing? N N  Doing errands, shopping? N N  Preparing Food and eating ? - N  Comment - denies dentures  Using the Toilet? - N  In the past six months, have you accidently leaked urine? - N  Do you have problems with loss of bowel control? - N  Managing your Medications? - N  Managing your Finances? - N  Housekeeping or managing your Housekeeping? - N  Some recent data might be hidden    Patient Care Team: Steele Sizer, MD as PCP - General (Family Medicine) Lavonia Dana, MD as Consulting Physician (Nephrology) Dasher, Rayvon Char, MD as Consulting Physician (Dermatology) Kem Boroughs, FNP as Nurse Practitioner (Gynecology)    Assessment:   This is a routine wellness examination for [place].  Exercise Activities and Dietary recommendations Current Exercise Habits: The patient does not participate in regular exercise at present, Exercise limited by: None identified  Goals    . DIET - INCREASE WATER INTAKE     Recommend to drink at least 6-8 8oz glasses of water per day.       Fall Risk Fall Risk  07/18/2017 07/18/2017 01/16/2017 06/20/2016 12/14/2015  Falls in the past year? Yes No No Yes Yes    Comment slipped in puddle of water. Admitted to SNF for rehab - - Accident slipped on water on gym floor -  Number falls in past yr: 1 - - 1 1  Injury with Fall? Yes - - Yes Yes  Comment - - - - -  Risk for fall due to : Other (Comment);Impaired vision - - - -  Risk for fall due to: Comment knee pain, dyspnea; wears eyeglasses; glaucoma and B cataracts - - - -  Follow up Falls evaluation completed;Education provided;Falls prevention discussed - - - -   FALL RISK PREVENTION PERTAINING TO HOME: Is your home free of loose throw rugs in walkways, pet beds, electrical cords, etc? Yes Is there adequate lighting in your home to reduce risk of falls?  Yes Are there stairs in or around your home WITH handrails? Yes  ASSISTIVE DEVICES UTILIZED TO PREVENT FALLS: Use of a cane, walker or w/c? No Grab bars in the bathroom? No  Shower chair or a place to sit while bathing? Yes An elevated toilet seat or a handicapped toilet? Yes  Timed Get Up and Go Performed: Yes. Pt ambulated 10 feet within 10 sec. Gait slow, steady and without the use of an assistive device. No intervention required at this time. Fall risk prevention has been discussed.  Community Resource Referral:  Pt declined my offer to send [employer] Referral to Care Guide for installation of grab bars in the shower.  Depression Screen PHQ 2/9 Scores 07/18/2017 07/18/2017 01/16/2017 06/20/2016  PHQ - 2 Score 0 0 0 0  PHQ- 9 Score 0 1 - -     Cognitive Function     6CIT Screen 07/18/2017  What Year? 0 points  What month? 0 points  What time? 0 points  Count back from 20 0 points  Months in reverse 0 points  Repeat phrase 0 points  Total Score 0    Immunization History  Administered Date(s) Administered  . Influenza Split 11/01/2006, 10/16/2008  .  Influenza, High Dose Seasonal PF 12/12/2014, 09/28/2015, 10/18/2016  . Influenza, Seasonal, Injecte, Preservative Fre 09/07/2010, 01/16/2012  . Influenza,inj,Quad PF,6+ Mos  12/10/2012, 10/14/2013  . Pneumococcal Conjugate-13 12/17/2013  . Pneumococcal Polysaccharide-23 09/28/2015  . Tdap 03/03/2010  . Zoster 11/28/2007    Qualifies for Shingles Vaccine? Yes. Zostavax completed 11/28/07. Due for Shingrix. Education has been provided regarding the importance of this vaccine. Pt has been advised to call her insurance company to determine her out of pocket expense. Advised she may also receive this vaccine at her local pharmacy or Health Dept. Verbalized acceptance and understanding.  Screening Tests Health Maintenance  Topic Date Due  . MAMMOGRAM  03/25/2017  . INFLUENZA VACCINE  08/10/2017  . TETANUS/TDAP  03/03/2020  . DEXA SCAN  Completed  . PNA vac Low Risk Adult  Completed    Cancer Screenings: Lung: Low Dose CT Chest recommended if Age 58-80 years, 30 pack-year currently smoking OR have quit w/in 15years. Patient does not qualify. Breast Screening: Pt scheduled for repeat mammography on 08/08/17 Bone Density/Dexa: Completed 08/15/16. Repeat every 2 years Colorectal: No longer required  Additional Screenings: Hepatitis C Screening: Does not qualify    Plan:  I have personally reviewed and addressed the Medicare Annual Wellness questionnaire and have noted the following in the patient's chart:  A. Medical and social history B. Use of alcohol, tobacco or illicit drugs  C. Current medications and supplements D. Functional ability and status E.  Nutritional status F.  Physical activity G. Advance directives H. List of other physicians I.  Hospitalizations, surgeries, and ER visits in previous 12 months J.  [place] such as hearing and vision if needed, cognitive and depression L. Referrals and appointments  In addition, I have reviewed and discussed with patient certain preventive protocols, quality metrics, and best practice recommendations. A written personalized care plan for preventive services as well as general preventive health  recommendations were provided to patient.  See attached scanned questionnaire for additional information.   Signed,  Aleatha Borer, LPN Nurse Health Advisor   I have reviewed this encounter including the documentation in this note and/or discussed this patient with the provider, Aleatha Borer, LPN. I am certifying that I agree with the content of this note as supervising physician.  Steele Sizer, MD [facility] 07/18/2017, 7:02 PM

## 2017-07-18 NOTE — Progress Notes (Signed)
Name: Theresa Hall   MRN: [id number]    DOB: 26-Feb-1940   Date:07/18/2017       Progress Note  Subjective  Chief Complaint  Chief Complaint  Patient presents with  . Medication Refill    6 month F/U  . Hypertension    Denies any symptoms  . Hypothyroidism    Losing her hair in one spot on the top of her head, cold intolerant   . Migraine  . Hyperlipidemia  . Gastroesophageal Reflux  . Osteopenia    HPI  HTN and she has a history of CKI stage III: last labs showed GFR was 57  Seeing Dr. Abigail Butts yearly.BPis at goal, on low dose of Avapro and is doing better   Hypothyroidism: last TSH was back to normal,  we adjusted dose to 88 mcg daily and one and a half T and T and one daily other days of the week. She still feels tired but is not consistent. She is still very busy and involved, with meetings and activities. She does not nap.  Migraine headaches: she had it for many years, and got worse when her husband was diagnosed with cancer. No symptoms since her husband died. Pain described as like her head was going to explode and associated with nausea.Taking Topamax every other night now and symptoms have been controlled, no side effect of medication. Unchanged   Hyperlipidemia: taking medication and denies side effects, we will recheck labs.   Osteopenia: discussed results, discussed high calcium and vitamin D diet and she has been taking low dose fosamax without side effects.   GERD: she states symptoms are intermittent, usually at night, more like a burning sensation at night. She is taking Ranitidine and is doing well at this time  OA: mild right knee pain when she goes from sitting to standing position.  Patient Active Problem List   Diagnosis Date Noted  . Osteopenia 08/15/2016  . Closed displaced comminuted fracture of right patella 08/20/2015  . Reflux esophagitis 06/23/2014  . HLD (hyperlipidemia) 06/23/2014  . Palpitations 06/23/2014  . Chronic kidney disease  (CKD), stage III (moderate) ([place]) 08/24/2009  . History of diverticulitis 05/14/2008  . Adult hypothyroidism 12/04/2006  . Migraine without aura 08/02/2006  . 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 APPENDECTOMY    . BASAL CELL CARCINOMA EXCISION    . BASAL CELL CARCINOMA EXCISION  2014   nasal  . CHOLECYSTECTOMY    . COLONOSCOPY    . ORIF PATELLA Right 08/15/2015   Procedure: OPEN REDUCTION INTERNAL (ORIF) FIXATION PATELLA  and application wound vac;  Surgeon: Corky Mull, MD;  Location: [facility] ORS;  Service: Orthopedics;  Laterality: Right;  . TONSILLECTOMY    . VAGINAL HYSTERECTOMY      Family History  Problem Relation Age of Onset  . Heart attack Father        x2  . Heart disease Sister   . Kidney disease Sister   . Diabetes Sister   . Heart attack Brother   . Diabetes Brother   . Heart attack Brother   . Breast cancer Neg Hx     Social History   Socioeconomic History  . Marital status: Widowed    Spouse name: Gershon Mussel  . Number of children: 2  . Years of education: some college  . Highest education level: 12th grade  Occupational History  . Occupation: Retired  Scientific laboratory technician  . Financial resource strain:  Not hard at all  . Food insecurity:    Worry: Never true    Inability: Never true  . Transportation needs:    Medical: No    Non-medical: No  Tobacco Use  . Smoking status: Never Smoker  . Smokeless tobacco: Never Used  . Tobacco comment: smoking cessation materials not required  Substance and Sexual Activity  . Alcohol use: No  . Drug use: No  . Sexual activity: Never    Birth control/protection: Surgical    Comment: TAH-1989  Lifestyle  . Physical activity:    Days per week: 0 days    Minutes per session: 0 min  . Stress: Not at all  Relationships  . Social connections:    Talks on phone: More than three times a week    Gets together: Three times a week    Attends religious service: More than  4 times per year    Active member of club or organization: Yes    Attends meetings of clubs or organizations: More than 4 times per year    Relationship status: Widowed  . Intimate partner violence:    Fear of current or ex partner: No    Emotionally abused: No    Physically abused: No    Forced sexual activity: No  Other Topics Concern  . Not on file  Social History Narrative   She has been a widow since 2009     Current Outpatient Medications:  .  alendronate (FOSAMAX) 35 MG tablet, TAKE 1 TABLET BY MOUTH EVERY 7 DAYS. TAKE WITH WATER ON AN EMPTY STOMACH., Disp: 12 tablet, Rfl: 1 .  ASPIRIN LOW DOSE 81 MG tablet, Take 1 tablet by mouth daily. , Disp: , Rfl:  .  azelastine (ASTELIN) 0.1 % nasal spray, Place 2 sprays into both nostrils 2 (two) times daily. Use in each nostril as directed (Patient taking differently: Place 2 sprays into both nostrils daily. Use in each nostril as directed ), Disp: 30 mL, Rfl: 2 .  Calcium Polycarbophil (FIBERCON PO), Take 1 tablet by mouth daily. , Disp: , Rfl:  .  cholecalciferol (VITAMIN D) 1000 units tablet, Take 1 capsule by mouth daily., Disp: , Rfl:  .  Fish Oil-Cholecalciferol (FISH OIL + D3) 1200-1000 MG-UNIT CAPS, Take 1 capsule by mouth daily., Disp: , Rfl:  .  irbesartan (AVAPRO) 75 MG tablet, Take 1 tablet (75 mg total) by mouth daily., Disp: 90 tablet, Rfl: 1 .  latanoprost (XALATAN) 0.005 % ophthalmic solution, PLACE 1 DROP INTO BOTH EYES AT BEDTIME, Disp: , Rfl: 5 .  Multiple Vitamin (MULTIVITAMIN) tablet, Take 1 tablet by mouth daily., Disp: , Rfl:  .  pravastatin (PRAVACHOL) 80 MG tablet, Take 1 tablet (80 mg total) by mouth daily., Disp: 90 tablet, Rfl: 3 .  ranitidine (ZANTAC) 150 MG tablet, Take 1 tablet (150 mg total) by mouth 2 (two) times daily., Disp: 180 tablet, Rfl: 3 .  SYNTHROID 88 MCG tablet, TAKE 1 TABLET BY MOUTH DAILY. TAKE 1 AND 1/2 TABLES ON TUESDAYS AND THURSDAYS, Disp: 100 tablet, Rfl: 0 .  topiramate (TOPAMAX) 50 MG  tablet, Take 1 tablet (50 mg total) by mouth every evening. (Patient taking differently: Take 50 mg by mouth every other day. ), Disp: 90 tablet, Rfl: 1 .  vitamin C (ASCORBIC ACID) 500 MG tablet, Take 500 mg by mouth daily., Disp: , Rfl:   Allergies  Allergen Reactions  . Codeine Other (See Comments)    Patient had insomnia and  nausea  . Oxycodone Other (See Comments)    Made her "Loopy"     ROS  Constitutional: Negative for fever or weight change.  Respiratory: Negative for cough and shortness of breath.   Cardiovascular: Negative for chest pain or palpitations.  Gastrointestinal: Negative for abdominal pain, no bowel changes.  Musculoskeletal: Negative for gait problem or joint swelling.  Skin: Negative for rash.  Neurological: Negative for dizziness or headache.  No other specific complaints in a complete review of systems (except as listed in HPI above).  Objective  Vitals:   07/18/17 1422  BP: 140/70  Pulse: 83  Resp: 16  Temp: 98.7 F (37.1 C)  TempSrc: Oral  SpO2: 97%  Weight: 157 lb 3.2 oz (71.3 kg)  Height: 5\' 4"  (1.626 m)    Body mass index is 26.98 kg/m.  Physical Exam  Constitutional: Patient appears well-developed and well-nourished. Overweight. No distress.  HEENT: head atraumatic, normocephalic, pupils equal and reactive to light, neck supple, throat within normal limits Cardiovascular: Normal rate, regular rhythm and normal heart sounds.  No murmur heard. No BLE edema. Pulmonary/Chest: Effort normal and breath sounds normal. No respiratory distress. Abdominal: Soft.  There is no tenderness. Psychiatric: Patient has a normal mood and affect. behavior is normal. Judgment and thought content normal.  Recent Results (from the past 2160 hour(s))  CBC and differential     Status: None   Collection Time: 07/05/17 12:00 AM  Result Value Ref Range   Platelets 189 150 - 399  VITAMIN D 25 Hydroxy (Vit-D Deficiency, Fractures)     Status: None   Collection  Time: 07/05/17 12:00 AM  Result Value Ref Range   Vit D, 25-Hydroxy 60   Basic metabolic panel     Status: Abnormal   Collection Time: 07/05/17 12:00 AM  Result Value Ref Range   BUN 22 (A) 4 - 21   Creatinine 1.2 (A) 0.5 - 1.1   Potassium 4.2 3.4 - 5.3   Sodium 137 137 - 147  Microalbumin, urine     Status: None   Collection Time: 07/05/17 12:00 AM  Result Value Ref Range   Microalb, Ur normal   CBC and differential     Status: None   Collection Time: 07/05/17 12:00 AM  Result Value Ref Range   Hemoglobin 13.7 12.0 - 16.0      PHQ2/9: Depression screen [facility] 2/9 07/18/2017 07/18/2017 01/16/2017 06/20/2016 12/14/2015  Decreased Interest 0 0 0 0 0  Down, Depressed, Hopeless 0 0 0 0 0  PHQ - 2 Score 0 0 0 0 0  Altered sleeping 0 0 - - -  Tired, decreased energy 0 1 - - -  Change in appetite 0 0 - - -  Feeling bad or failure about yourself  0 0 - - -  Trouble concentrating 0 0 - - -  Moving slowly or fidgety/restless 0 0 - - -  Suicidal thoughts 0 0 - - -  PHQ-9 Score 0 1 - - -  Difficult doing work/chores Not difficult at all Not difficult at all - - -     Fall Risk: Fall Risk  07/18/2017 07/18/2017 01/16/2017 06/20/2016 12/14/2015  Falls in the past year? Yes No No Yes Yes  Comment slipped in puddle of water. Admitted to SNF for rehab - - Accident slipped on water on gym floor -  Number falls in past yr: 1 - - 1 1  Injury with Fall? Yes - - Yes Yes  Comment - - - - -  Risk for fall due to : Other (Comment);Impaired vision - - - -  Risk for fall due to: Comment knee pain, dyspnea; wears eyeglasses; glaucoma and B cataracts - - - -  Follow up Falls evaluation completed;Education provided;Falls prevention discussed - - - -     Functional Status Survey: Is the patient deaf or have difficulty hearing?: No Does the patient have difficulty seeing, even when wearing glasses/contacts?: Yes Does the patient have difficulty concentrating, remembering, or making decisions?: No Does the  patient have difficulty walking or climbing stairs?: No Does the patient have difficulty dressing or bathing?: No Does the patient have difficulty doing errands alone such as visiting a doctor's office or shopping?: No   Assessment & Plan  1. Chronic kidney disease, stage III (moderate) (HCC)  Recent visit with Dr. Abigail Butts and reviewed labs  - irbesartan (AVAPRO) 75 MG tablet; Take 1 tablet (75 mg total) by mouth daily.  Dispense: 90 tablet; Refill: 1  2. Hypertension, benign  - irbesartan (AVAPRO) 75 MG tablet; Take 1 tablet (75 mg total) by mouth daily.  Dispense: 90 tablet; Refill: 1  3. Dyslipidemia  - pravastatin (PRAVACHOL) 80 MG tablet; Take 1 tablet (80 mg total) by mouth daily.  Dispense: 90 tablet; Refill: 3  4. GERD without esophagitis  - ranitidine (ZANTAC) 150 MG tablet; Take 1 tablet (150 mg total) by mouth 2 (two) times daily.  Dispense: 180 tablet; Refill: 3  5. Adult hypothyroidism  - TSH  6. High triglycerides  - Lipid panel  7. Elevated glucose  - Hemoglobin A1c  8. Secondary hyperparathyroidism of renal origin ([place])  Pht was 31 , explained to the patient

## 2017-07-19 LAB — LIPID PANEL
CHOL/HDL RATIO: 3.3 (calc) (ref <5.0)
CHOLESTEROL: 163 mg/dL (ref <200)
HDL: 50 mg/dL — AB (ref >50)
LDL CHOLESTEROL (CALC): 76 mg/dL
Non-HDL Cholesterol (Calc): 113 mg/dL (calc) (ref <130)
Triglycerides: 285 mg/dL — ABNORMAL HIGH (ref <150)

## 2017-07-19 LAB — HEMOGLOBIN A1C
HEMOGLOBIN A1C: 5 %{Hb} (ref <5.7)
Mean Plasma Glucose: 97 (calc)
eAG (mmol/L): 5.4 (calc)

## 2017-07-19 LAB — TSH: TSH: 1.15 mIU/L (ref 0.40–4.50)

## 2017-07-20 ENCOUNTER — Ambulatory Visit: Payer: Medicare HMO | Admitting: Certified Nurse Midwife

## 2017-08-06 DIAGNOSIS — R05 Cough: Secondary | ICD-10-CM | POA: Diagnosis not present

## 2017-08-06 DIAGNOSIS — J00 Acute nasopharyngitis [common cold]: Secondary | ICD-10-CM | POA: Diagnosis not present

## 2017-08-13 ENCOUNTER — Other Ambulatory Visit: Payer: Self-pay | Admitting: Family Medicine

## 2017-08-18 DIAGNOSIS — Z85820 Personal history of malignant melanoma of skin: Secondary | ICD-10-CM | POA: Diagnosis not present

## 2017-08-18 DIAGNOSIS — Z85828 Personal history of other malignant neoplasm of skin: Secondary | ICD-10-CM | POA: Diagnosis not present

## 2017-08-18 DIAGNOSIS — Z08 Encounter for follow-up examination after completed treatment for malignant neoplasm: Secondary | ICD-10-CM | POA: Diagnosis not present

## 2017-08-18 DIAGNOSIS — D2262 Melanocytic nevi of left upper limb, including shoulder: Secondary | ICD-10-CM | POA: Diagnosis not present

## 2017-08-18 DIAGNOSIS — L821 Other seborrheic keratosis: Secondary | ICD-10-CM | POA: Diagnosis not present

## 2017-08-18 DIAGNOSIS — D2261 Melanocytic nevi of right upper limb, including shoulder: Secondary | ICD-10-CM | POA: Diagnosis not present

## 2017-08-18 DIAGNOSIS — D2271 Melanocytic nevi of right lower limb, including hip: Secondary | ICD-10-CM | POA: Diagnosis not present

## 2017-08-18 DIAGNOSIS — D485 Neoplasm of uncertain behavior of skin: Secondary | ICD-10-CM | POA: Diagnosis not present

## 2017-08-18 DIAGNOSIS — D2272 Melanocytic nevi of left lower limb, including hip: Secondary | ICD-10-CM | POA: Diagnosis not present

## 2017-08-18 DIAGNOSIS — C44319 Basal cell carcinoma of skin of other parts of face: Secondary | ICD-10-CM | POA: Diagnosis not present

## 2017-08-24 ENCOUNTER — Other Ambulatory Visit: Payer: Self-pay | Admitting: Family Medicine

## 2017-08-24 ENCOUNTER — Ambulatory Visit
Admission: RE | Admit: 2017-08-24 | Discharge: 2017-08-24 | Disposition: A | Discharge status: Home / Self Care | Payer: Medicare HMO | Source: Ambulatory Visit | Attending: Family Medicine | Admitting: Family Medicine

## 2017-08-24 DIAGNOSIS — R928 Other abnormal and inconclusive findings on diagnostic imaging of breast: Secondary | ICD-10-CM

## 2017-08-24 DIAGNOSIS — Z1231 Encounter for screening mammogram for malignant neoplasm of breast: Secondary | ICD-10-CM | POA: Diagnosis not present

## 2017-08-24 DIAGNOSIS — N6489 Other specified disorders of breast: Secondary | ICD-10-CM

## 2017-09-01 ENCOUNTER — Ambulatory Visit
Admission: RE | Admit: 2017-09-01 | Discharge: 2017-09-01 | Disposition: A | Discharge status: Home / Self Care | Payer: Medicare HMO | Source: Ambulatory Visit | Attending: Family Medicine | Admitting: Family Medicine

## 2017-09-01 DIAGNOSIS — N6489 Other specified disorders of breast: Secondary | ICD-10-CM | POA: Insufficient documentation

## 2017-09-01 DIAGNOSIS — R928 Other abnormal and inconclusive findings on diagnostic imaging of breast: Secondary | ICD-10-CM

## 2017-10-12 ENCOUNTER — Other Ambulatory Visit: Payer: Self-pay | Admitting: Family Medicine

## 2017-10-13 DIAGNOSIS — R69 Illness, unspecified: Secondary | ICD-10-CM | POA: Diagnosis not present

## 2017-10-16 DIAGNOSIS — C44319 Basal cell carcinoma of skin of other parts of face: Secondary | ICD-10-CM | POA: Diagnosis not present

## 2017-10-16 DIAGNOSIS — C4491 Basal cell carcinoma of skin, unspecified: Secondary | ICD-10-CM | POA: Diagnosis not present

## 2017-10-16 DIAGNOSIS — L988 Other specified disorders of the skin and subcutaneous tissue: Secondary | ICD-10-CM | POA: Diagnosis not present

## 2017-10-16 DIAGNOSIS — L578 Other skin changes due to chronic exposure to nonionizing radiation: Secondary | ICD-10-CM | POA: Diagnosis not present

## 2017-10-16 DIAGNOSIS — L814 Other melanin hyperpigmentation: Secondary | ICD-10-CM | POA: Diagnosis not present

## 2017-10-30 DIAGNOSIS — H401131 Primary open-angle glaucoma, bilateral, mild stage: Secondary | ICD-10-CM | POA: Diagnosis not present

## 2017-11-03 DIAGNOSIS — H401131 Primary open-angle glaucoma, bilateral, mild stage: Secondary | ICD-10-CM | POA: Diagnosis not present

## 2017-11-07 ENCOUNTER — Other Ambulatory Visit: Payer: Self-pay | Admitting: Family Medicine

## 2017-11-07 NOTE — Telephone Encounter (Signed)
Refill request for thyroid medication. [facility]  Last visit: 07/18/2017   Lab Results  Component Value Date   TSH 1.15 07/18/2017     Follow up on 01/23/2018

## 2017-11-11 ENCOUNTER — Other Ambulatory Visit: Payer: Self-pay | Admitting: Family Medicine

## 2017-12-19 ENCOUNTER — Other Ambulatory Visit: Payer: Self-pay | Admitting: Family Medicine

## 2017-12-19 DIAGNOSIS — M8580 Other specified disorders of bone density and structure, unspecified site: Secondary | ICD-10-CM

## 2018-01-15 DIAGNOSIS — J00 Acute nasopharyngitis [common cold]: Secondary | ICD-10-CM | POA: Diagnosis not present

## 2018-01-23 ENCOUNTER — Ambulatory Visit (INDEPENDENT_AMBULATORY_CARE_PROVIDER_SITE_OTHER): Payer: Medicare HMO | Admitting: Family Medicine

## 2018-01-23 ENCOUNTER — Encounter: Payer: Self-pay | Admitting: Family Medicine

## 2018-01-23 VITALS — BP 128/74 | HR 93 | Temp 98.3°F | Resp 16 | Ht 64.0 in | Wt 159.8 lb

## 2018-01-23 DIAGNOSIS — E039 Hypothyroidism, unspecified: Secondary | ICD-10-CM

## 2018-01-23 DIAGNOSIS — R0982 Postnasal drip: Secondary | ICD-10-CM | POA: Diagnosis not present

## 2018-01-23 DIAGNOSIS — K219 Gastro-esophageal reflux disease without esophagitis: Secondary | ICD-10-CM

## 2018-01-23 DIAGNOSIS — I10 Essential (primary) hypertension: Secondary | ICD-10-CM | POA: Diagnosis not present

## 2018-01-23 DIAGNOSIS — E781 Pure hyperglyceridemia: Secondary | ICD-10-CM

## 2018-01-23 DIAGNOSIS — G43009 Migraine without aura, not intractable, without status migrainosus: Secondary | ICD-10-CM

## 2018-01-23 DIAGNOSIS — N183 Chronic kidney disease, stage 3 unspecified: Secondary | ICD-10-CM

## 2018-01-23 DIAGNOSIS — R490 Dysphonia: Secondary | ICD-10-CM | POA: Diagnosis not present

## 2018-01-23 DIAGNOSIS — E785 Hyperlipidemia, unspecified: Secondary | ICD-10-CM | POA: Diagnosis not present

## 2018-01-23 DIAGNOSIS — N2581 Secondary hyperparathyroidism of renal origin: Secondary | ICD-10-CM

## 2018-01-23 LAB — COMPLETE METABOLIC PANEL WITH GFR
AG RATIO: 1.6 (calc) (ref 1.0–2.5)
ALT: 25 U/L (ref 6–29)
AST: 20 U/L (ref 10–35)
Albumin: 4.4 g/dL (ref 3.6–5.1)
Alkaline phosphatase (APISO): 55 U/L (ref 33–130)
BUN/Creatinine Ratio: 18 (calc) (ref 6–22)
BUN: 22 mg/dL (ref 7–25)
CALCIUM: 9.9 mg/dL (ref 8.6–10.4)
CO2: 21 mmol/L (ref 20–32)
Chloride: 110 mmol/L (ref 98–110)
Creat: 1.2 mg/dL — ABNORMAL HIGH (ref 0.60–0.93)
GFR, EST AFRICAN AMERICAN: 50 mL/min/{1.73_m2} — AB (ref >=60)
GFR, EST NON AFRICAN AMERICAN: 43 mL/min/{1.73_m2} — AB (ref >=60)
GLOBULIN: 2.7 g/dL (ref 1.9–3.7)
Glucose, Bld: 101 mg/dL — ABNORMAL HIGH (ref 65–99)
Potassium: 4.5 mmol/L (ref 3.5–5.3)
SODIUM: 140 mmol/L (ref 135–146)
TOTAL PROTEIN: 7.1 g/dL (ref 6.1–8.1)
Total Bilirubin: 0.4 mg/dL (ref 0.2–1.2)

## 2018-01-23 LAB — LIPID PANEL
Cholesterol: 152 mg/dL (ref <200)
HDL: 49 mg/dL — AB (ref >50)
LDL Cholesterol (Calc): 83 mg/dL (calc)
NON-HDL CHOLESTEROL (CALC): 103 mg/dL (ref <130)
TRIGLYCERIDES: 106 mg/dL (ref <150)
Total CHOL/HDL Ratio: 3.1 (calc) (ref <5.0)

## 2018-01-23 LAB — CBC WITH DIFFERENTIAL/PLATELET
ABSOLUTE MONOCYTES: 442 {cells}/uL (ref 200–950)
BASOS PCT: 1.6 %
Basophils Absolute: 83 cells/uL (ref 0–200)
EOS PCT: 2.7 %
Eosinophils Absolute: 140 cells/uL (ref 15–500)
HEMATOCRIT: 36.6 % (ref 35.0–45.0)
Hemoglobin: 12.5 g/dL (ref 11.7–15.5)
Lymphs Abs: 2007 cells/uL (ref 850–3900)
MCH: 31.6 pg (ref 27.0–33.0)
MCHC: 34.2 g/dL (ref 32.0–36.0)
MCV: 92.4 fL (ref 80.0–100.0)
MPV: 10.9 fL (ref 7.5–12.5)
Monocytes Relative: 8.5 %
NEUTROS ABS: 2527 {cells}/uL (ref 1500–7800)
Neutrophils Relative %: 48.6 %
Platelets: 293 10*3/uL (ref 140–400)
RBC: 3.96 10*6/uL (ref 3.80–5.10)
RDW: 11.9 % (ref 11.0–15.0)
Total Lymphocyte: 38.6 %
WBC: 5.2 10*3/uL (ref 3.8–10.8)

## 2018-01-23 LAB — TSH: TSH: 1.47 mIU/L (ref 0.40–4.50)

## 2018-01-23 MED ORDER — TOPIRAMATE 50 MG PO TABS: 50.0000 mg | ORAL_TABLET | Freq: Every evening | ORAL | 1 refills | Status: DC

## 2018-01-23 MED ORDER — IRBESARTAN 75 MG PO TABS: 75.0000 mg | ORAL_TABLET | Freq: Every day | ORAL | 1 refills | Status: DC

## 2018-01-23 NOTE — Progress Notes (Signed)
Name: Theresa Hall   MRN: [id number]    DOB: 1940-08-27   Date:01/23/2018       Progress Note  Subjective  Chief Complaint  Chief Complaint  Patient presents with  . Medication Refill  . Gastroesophageal Reflux    Doesn't feel like the Ranitidine as effective-at night she will have some reflux-cut out of caffeine  . Hypertension    Denies any symptoms  . Migraine    Topiramate is keeping them under control has not had one recently  . Hypothyroidism    Denies any symptoms  . Hyperlipidemia  . Osteopenia  . Cough    Onset-10 days, coughing up green phlegm, has been taking Mucinex twice daily and saline nasal spray.    HPI  HTN and she has a history of CKI stage III: last labs showed GFR was 4  Seeing Dr. Abigail Butts yearly.BPis at goal, on low dose of Avapro no chest pain or palpitation   Hypothyroidism: last TSH was back to normal,  we adjusted dose to 88 mcg daily and one and a half T and T andone daily other days of the week. No ahir loss, palpitation or change in bowel movements   Migraine headaches: she had it for many years, and got worse when her husband was diagnosed with cancer. No symptoms since her husband died. Pain described as like her head was going to explode and associated with nausea.Taking Topamax every night again and is doing well now.   Hyperlipidemia: taking medication and denies side effects, reviewed labs done July 2019 and LDL was at goal   Osteopenia: discussed results, discussed high calcium and vitamin D diet and she has been taking low dose fosamax without side effects.   GERD: she states symptoms are intermittent, she eats dinner around 7 pm and has occasional  indigestion after she eats, but resolves by the time she goes to bed. She has been taking Ranitidine before dinner.   URI: she states symptoms started 10 days ago, with sore throat, followed by hoarseness, lots of green post-nasal drainage, and very mild cough. She states went to UC on  01/06 and negative flu test, she was given diagnosis of URI and advised Mucinex and is finally feeling a little better today. She denies associated fever or chills. However she noticed fatigue, lack of appetite.     Patient Active Problem List   Diagnosis Date Noted  . BCC (basal cell carcinoma of skin) 10/16/2017  . Secondary hyperparathyroidism of renal origin ([place]) 07/18/2017  . Osteopenia 08/15/2016  . Closed displaced comminuted fracture of right patella 08/20/2015  . Reflux esophagitis 06/23/2014  . HLD (hyperlipidemia) 06/23/2014  . Palpitations 06/23/2014  . Chronic kidney disease (CKD), stage III (moderate) ([place]) 08/24/2009  . History of diverticulitis 05/14/2008  . Adult hypothyroidism 12/04/2006  . Migraine without aura 08/02/2006  . 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 APPENDECTOMY    . BASAL CELL CARCINOMA EXCISION    . BASAL CELL CARCINOMA EXCISION  2014   nasal  . CHOLECYSTECTOMY    . COLONOSCOPY    . ORIF PATELLA Right 08/15/2015   Procedure: OPEN REDUCTION INTERNAL (ORIF) FIXATION PATELLA  and application wound vac;  Surgeon: Corky Mull, MD;  Location: [facility] ORS;  Service: Orthopedics;  Laterality: Right;  . TONSILLECTOMY    . VAGINAL HYSTERECTOMY      Family History  Problem Relation Age of Onset  . Heart attack  Father        x2  . Heart disease Sister   . Kidney disease Sister   . Diabetes Sister   . Heart attack Brother   . Diabetes Brother   . Heart attack Brother   . Breast cancer Neg Hx     Social History   Socioeconomic History  . Marital status: Widowed    Spouse name: Gershon Mussel  . Number of children: 2  . Years of education: some college  . Highest education level: 12th grade  Occupational History  . Occupation: Retired  Scientific laboratory technician  . Financial resource strain: Not hard at all  . Food insecurity:    Worry: Never true    Inability: Never true  . Transportation needs:     Medical: No    Non-medical: No  Tobacco Use  . Smoking status: Never Smoker  . Smokeless tobacco: Never Used  . Tobacco comment: smoking cessation materials not required  Substance and Sexual Activity  . Alcohol use: No  . Drug use: No  . Sexual activity: Never    Birth control/protection: Surgical    Comment: TAH-1989  Lifestyle  . Physical activity:    Days per week: 0 days    Minutes per session: 0 min  . Stress: Not at all  Relationships  . Social connections:    Talks on phone: More than three times a week    Gets together: Three times a week    Attends religious service: More than 4 times per year    Active member of club or organization: Yes    Attends meetings of clubs or organizations: More than 4 times per year    Relationship status: Widowed  . Intimate partner violence:    Fear of current or ex partner: No    Emotionally abused: No    Physically abused: No    Forced sexual activity: No  Other Topics Concern  . Not on file  Social History Narrative   She has been a widow since 2009     Current Outpatient Medications:  .  alendronate (FOSAMAX) 35 MG tablet, TAKE 1 TABLET BY MOUTH EVERY 7 DAYS. TAKE WITH WATER ON AN EMPTY STOMACH., Disp: 12 tablet, Rfl: 1 .  ASPIRIN LOW DOSE 81 MG tablet, Take 1 tablet by mouth daily. , Disp: , Rfl:  .  azelastine (ASTELIN) 0.1 % nasal spray, PLACE 2 SPRAYS INTO BOTH NOSTRILS 2 (TWO) TIMES DAILY. USE IN EACH NOSTRIL AS DIRECTED, Disp: 30 mL, Rfl: 2 .  Calcium Polycarbophil (FIBERCON PO), Take 1 tablet by mouth at bedtime. , Disp: , Rfl:  .  cholecalciferol (VITAMIN D) 1000 units tablet, Take 1 capsule by mouth daily., Disp: , Rfl:  .  Fish Oil-Cholecalciferol (FISH OIL + D3) 1200-1000 MG-UNIT CAPS, Take 1 capsule by mouth daily., Disp: , Rfl:  .  irbesartan (AVAPRO) 75 MG tablet, Take 1 tablet (75 mg total) by mouth daily., Disp: 90 tablet, Rfl: 1 .  latanoprost (XALATAN) 0.005 % ophthalmic solution, PLACE 1 DROP INTO BOTH EYES  AT BEDTIME, Disp: , Rfl: 5 .  Multiple Vitamin (MULTIVITAMIN) tablet, Take 1 tablet by mouth daily., Disp: , Rfl:  .  pravastatin (PRAVACHOL) 80 MG tablet, Take 1 tablet (80 mg total) by mouth daily., Disp: 90 tablet, Rfl: 3 .  ranitidine (ZANTAC) 150 MG tablet, Take 1 tablet (150 mg total) by mouth 2 (two) times daily., Disp: 180 tablet, Rfl: 3 .  SYNTHROID 88 MCG tablet, TAKE 1  TABLET BY MOUTH DAILY. TAKE 1 AND 1/2 TABLETS ON TUESDAYS AND THURSDAYS, Disp: 100 tablet, Rfl: 0 .  topiramate (TOPAMAX) 50 MG tablet, TAKE 1 TABLET (50 MG TOTAL) BY MOUTH EVERY EVENING., Disp: 90 tablet, Rfl: 1 .  vitamin C (ASCORBIC ACID) 500 MG tablet, Take 500 mg by mouth daily., Disp: , Rfl:   Allergies  Allergen Reactions  . Codeine Other (See Comments)    Patient had insomnia and nausea  . Oxycodone Other (See Comments)    Made her "Loopy"    I personally reviewed active problem list, medication list, allergies, family history, social history with the patient/caregiver today.   ROS  Constitutional: Negative for fever or weight change.  Respiratory: positive  For mild  cough but no shortness of breath.   Cardiovascular: Negative for chest pain or palpitations.  Gastrointestinal: Negative for abdominal pain, no bowel changes.  Musculoskeletal: Negative for gait problem or joint swelling.  Skin: Negative for rash.  Neurological: Negative for dizziness or headache.  No other specific complaints in a complete review of systems (except as listed in HPI above).  Objective  Vitals:   01/23/18 1017  BP: (!) 144/78  Pulse: 93  Resp: 16  Temp: 98.3 F (36.8 C)  TempSrc: Oral  SpO2: 99%  Weight: 159 lb 12.8 oz (72.5 kg)  Height: 5\' 4"  (1.626 m)    Body mass index is 27.43 kg/m.  Physical Exam  Constitutional: Patient appears well-developed and well-nourished. Overweight.  No distress.  HEENT: head atraumatic, normocephalic, pupils equal and reactive to light, ears normal TM bilaterally  neck  supple, throat within normal limits, tender during percussion of frontal and maxillary sinus.  Cardiovascular: Normal rate, regular rhythm and normal heart sounds.  No murmur heard. No BLE edema. Pulmonary/Chest: Effort normal and breath sounds normal. No respiratory distress. Abdominal: Soft.  There is no tenderness. Psychiatric: Patient has a normal mood and affect. behavior is normal. Judgment and thought content normal.  PHQ2/9: Depression screen [facility] 2/9 01/23/2018 07/18/2017 07/18/2017 01/16/2017 06/20/2016  Decreased Interest 1 0 0 0 0  Down, Depressed, Hopeless 0 0 0 0 0  PHQ - 2 Score 1 0 0 0 0  Altered sleeping 0 0 0 - -  Tired, decreased energy 3 0 1 - -  Change in appetite 2 0 0 - -  Feeling bad or failure about yourself  0 0 0 - -  Trouble concentrating 1 0 0 - -  Moving slowly or fidgety/restless 0 0 0 - -  Suicidal thoughts 0 0 0 - -  PHQ-9 Score 7 0 1 - -  Difficult doing work/chores Somewhat difficult Not difficult at all Not difficult at all - -     Fall Risk: Fall Risk  01/23/2018 07/18/2017 07/18/2017 01/16/2017 06/20/2016  Falls in the past year? 0 Yes No No Yes  Comment - slipped in puddle of water. Admitted to SNF for rehab - - Accident slipped on water on gym floor  Number falls in past yr: - 1 - - 1  Injury with Fall? - Yes - - Yes  Comment - - - - -  Risk for fall due to : - Other (Comment);Impaired vision - - -  Risk for fall due to: Comment - knee pain, dyspnea; wears eyeglasses; glaucoma and B cataracts - - -  Follow up - Falls evaluation completed;Education provided;Falls prevention discussed - - -     Functional Status Survey: Is the patient deaf or have difficulty  hearing?: No Does the patient have difficulty seeing, even when wearing glasses/contacts?: Yes Does the patient have difficulty concentrating, remembering, or making decisions?: No Does the patient have difficulty walking or climbing stairs?: No Does the patient have difficulty dressing or bathing?:  No Does the patient have difficulty doing errands alone such as visiting a doctor's office or shopping?: No    Assessment & Plan  1. Chronic kidney disease, stage III (moderate) (HCC)  - COMPLETE METABOLIC PANEL WITH GFR - CBC with Differential/Platelet - irbesartan (AVAPRO) 75 MG tablet; Take 1 tablet (75 mg total) by mouth daily.  Dispense: 90 tablet; Refill: 1  2. Hypertension, benign  BP a little high today but not feeling well today  - irbesartan (AVAPRO) 75 MG tablet; Take 1 tablet (75 mg total) by mouth daily.  Dispense: 90 tablet; Refill: 1  3. Dyslipidemia  - Lipid panel  4. GERD without esophagitis  Stable   5. Adult hypothyroidism  - TSH  6. High triglycerides  Recheck level  7. Secondary hyperparathyroidism of renal origin [facility])  Sees nephrologist yeary   8. Hoarseness of voice   9. Post-nasal drainage  Explained that since she has been improving advised to monitor and if she gets worse she will call back for antibiotics   10. Migraine without aura and without status migrainosus, not intractable  - topiramate (TOPAMAX) 50 MG tablet; Take 1 tablet (50 mg total) by mouth every evening.  Dispense: 90 tablet; Refill: 1  She decided not to have a CPE today because she is not feeling well, she will re-schedule it

## 2018-01-24 ENCOUNTER — Other Ambulatory Visit: Payer: Self-pay | Admitting: Family Medicine

## 2018-01-24 MED ORDER — SYNTHROID 88 MCG PO TABS: ORAL_TABLET | ORAL | 0 refills | Status: DC

## 2018-01-30 ENCOUNTER — Other Ambulatory Visit: Payer: Self-pay | Admitting: Family Medicine

## 2018-05-02 ENCOUNTER — Encounter: Payer: Medicare HMO | Admitting: Family Medicine

## 2018-05-24 ENCOUNTER — Other Ambulatory Visit: Payer: Self-pay

## 2018-05-24 NOTE — Telephone Encounter (Signed)
Called patient to inform her of Ranitidine recall. Requesting alternate medication.

## 2018-05-25 MED ORDER — FAMOTIDINE 40 MG PO TABS: 40.0000 mg | ORAL_TABLET | Freq: Every day | ORAL | 0 refills | Status: DC

## 2018-05-25 NOTE — Telephone Encounter (Signed)
[facility] to [facility] [address].

## 2018-06-18 ENCOUNTER — Other Ambulatory Visit: Payer: Self-pay | Admitting: Family Medicine

## 2018-06-18 DIAGNOSIS — M8580 Other specified disorders of bone density and structure, unspecified site: Secondary | ICD-10-CM

## 2018-06-25 DIAGNOSIS — E785 Hyperlipidemia, unspecified: Secondary | ICD-10-CM | POA: Diagnosis not present

## 2018-06-25 DIAGNOSIS — H409 Unspecified glaucoma: Secondary | ICD-10-CM | POA: Diagnosis not present

## 2018-06-25 DIAGNOSIS — K219 Gastro-esophageal reflux disease without esophagitis: Secondary | ICD-10-CM | POA: Diagnosis not present

## 2018-06-25 DIAGNOSIS — M130 Polyarthritis, unspecified: Secondary | ICD-10-CM | POA: Diagnosis not present

## 2018-06-25 DIAGNOSIS — K5900 Constipation, unspecified: Secondary | ICD-10-CM | POA: Diagnosis not present

## 2018-06-25 DIAGNOSIS — G43909 Migraine, unspecified, not intractable, without status migrainosus: Secondary | ICD-10-CM | POA: Diagnosis not present

## 2018-06-25 DIAGNOSIS — I129 Hypertensive chronic kidney disease with stage 1 through stage 4 chronic kidney disease, or unspecified chronic kidney disease: Secondary | ICD-10-CM | POA: Diagnosis not present

## 2018-06-25 DIAGNOSIS — J302 Other seasonal allergic rhinitis: Secondary | ICD-10-CM | POA: Diagnosis not present

## 2018-06-25 DIAGNOSIS — E039 Hypothyroidism, unspecified: Secondary | ICD-10-CM | POA: Diagnosis not present

## 2018-06-25 DIAGNOSIS — M8580 Other specified disorders of bone density and structure, unspecified site: Secondary | ICD-10-CM | POA: Diagnosis not present

## 2018-06-28 ENCOUNTER — Other Ambulatory Visit: Payer: Self-pay | Admitting: Family Medicine

## 2018-07-02 DIAGNOSIS — H401131 Primary open-angle glaucoma, bilateral, mild stage: Secondary | ICD-10-CM | POA: Diagnosis not present

## 2018-07-03 ENCOUNTER — Other Ambulatory Visit: Payer: Self-pay | Admitting: Family Medicine

## 2018-07-03 DIAGNOSIS — E785 Hyperlipidemia, unspecified: Secondary | ICD-10-CM

## 2018-07-03 DIAGNOSIS — K219 Gastro-esophageal reflux disease without esophagitis: Secondary | ICD-10-CM

## 2018-07-03 MED ORDER — PRAVASTATIN SODIUM 80 MG PO TABS: 80.0000 mg | ORAL_TABLET | Freq: Every day | ORAL | 3 refills | Status: DC

## 2018-07-16 DIAGNOSIS — N183 Chronic kidney disease, stage 3 (moderate): Secondary | ICD-10-CM | POA: Diagnosis not present

## 2018-07-16 DIAGNOSIS — G43909 Migraine, unspecified, not intractable, without status migrainosus: Secondary | ICD-10-CM | POA: Diagnosis not present

## 2018-07-16 DIAGNOSIS — E785 Hyperlipidemia, unspecified: Secondary | ICD-10-CM | POA: Diagnosis not present

## 2018-07-16 DIAGNOSIS — I10 Essential (primary) hypertension: Secondary | ICD-10-CM | POA: Diagnosis not present

## 2018-07-16 DIAGNOSIS — N2581 Secondary hyperparathyroidism of renal origin: Secondary | ICD-10-CM | POA: Diagnosis not present

## 2018-07-16 DIAGNOSIS — D631 Anemia in chronic kidney disease: Secondary | ICD-10-CM | POA: Diagnosis not present

## 2018-07-20 ENCOUNTER — Ambulatory Visit: Payer: Medicare HMO | Admitting: Family Medicine

## 2018-07-20 ENCOUNTER — Ambulatory Visit (INDEPENDENT_AMBULATORY_CARE_PROVIDER_SITE_OTHER): Payer: Medicare HMO

## 2018-07-20 VITALS — BP 141/72 | HR 79 | Ht 64.0 in | Wt 155.0 lb

## 2018-07-20 DIAGNOSIS — Z0000 Encounter for general adult medical examination without abnormal findings: Secondary | ICD-10-CM | POA: Diagnosis not present

## 2018-07-20 DIAGNOSIS — Z1231 Encounter for screening mammogram for malignant neoplasm of breast: Secondary | ICD-10-CM

## 2018-07-20 NOTE — Progress Notes (Signed)
Subjective:   Theresa Hall is a 78 y.o. female who presents for Medicare Annual (Subsequent) preventive examination.  Virtual Visit via Telephone Note  I connected with TORRE PIKUS on 07/20/18 at  3:00 PM EDT by telephone and verified that I am speaking with the correct person using two identifiers.  Medicare Annual Wellness visit completed telephonically due to Covid-19 pandemic.  Location: Patient: home Provider: office   I discussed the limitations, risks, security and privacy concerns of performing an evaluation and management service by telephone and the availability of in person appointments. The patient expressed understanding and agreed to proceed.  Some vital signs may be absent or patient reported.   Clemetine Marker, LPN  Review of Systems:   Cardiac Risk Factors include: advanced age (>22men, >3 women);hypertension;dyslipidemia     Objective:     Vitals: BP (!) 141/72   Pulse 79   Ht 5\' 4"  (1.626 m)   Wt 155 lb (70.3 kg)   LMP 01/11/1987 (Approximate)   BMI 26.61 kg/m   Body mass index is 26.61 kg/m.  Advanced Directives 07/20/2018 07/18/2017 06/20/2016 11/11/2015 08/15/2015 08/14/2015 06/22/2015  Does Patient Have a Medical Advance Directive? Yes Yes Yes Yes Yes Yes Yes  Type of Paramedic of [place];Living will [place];Living will [place];Living will [place];Living will Living will Living will [place];Living will  Does patient want to make changes to medical advance directive? - - - No - Patient declined - - No - Patient declined  Copy of [place] in Chart? No - copy requested No - copy requested No - copy requested No - copy requested No - copy requested - No - copy requested    Tobacco Social History   Tobacco Use  Smoking Status Never Smoker  Smokeless Tobacco Never Used  Tobacco Comment   smoking cessation materials not  required     Counseling given: Not Answered Comment: smoking cessation materials not required   Clinical Intake:  Pre-visit preparation completed: Yes  Pain : No/denies pain     BMI - recorded: 26.61 Nutritional Status: BMI 25 -29 Overweight Nutritional Risks: None Diabetes: No  How often do you need to have someone help you when you read instructions, pamphlets, or other written materials from your doctor or pharmacy?: 1 - Never  Interpreter Needed?: No  Information entered by :: Clemetine Marker LPN  Past Medical History:  Diagnosis Date  . Allergic rhinitis, cause unspecified   . Allergy   . 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 Cancer ([place]) 2006   melanoma on shoulder   . Chronic kidney disease, stage I   . Chronic kidney disease, stage III (moderate) (HCC)   . Diverticulitis of colon (without mention of hemorrhage)(562.11)   . GERD (gastroesophageal reflux disease)   . Glaucoma   . Migraine without aura, without mention of intractable migraine without mention of status migrainosus   . Osteoarthritis   . Osteopenia   . Other abnormal glucose   . Other and unspecified hyperlipidemia   . Postablative ovarian failure   . Pure hypercholesterolemia   . Unspecified glaucoma(365.9)   . Unspecified hypothyroidism    Past Surgical History:  Procedure Laterality Date  . APPENDECTOMY    . BASAL CELL CARCINOMA EXCISION    . BASAL CELL CARCINOMA EXCISION  2014   nasal  . CHOLECYSTECTOMY    . COLONOSCOPY    .  ORIF PATELLA Right 08/15/2015   Procedure: OPEN REDUCTION INTERNAL (ORIF) FIXATION PATELLA  and application wound vac;  Surgeon: Corky Mull, MD;  Location: [facility] ORS;  Service: Orthopedics;  Laterality: Right;  . TONSILLECTOMY    . VAGINAL HYSTERECTOMY     Family History  Problem Relation Age of Onset  . Heart attack Father        x2  . Heart disease Sister   . Kidney disease Sister   . Diabetes Sister   . Heart attack Brother   . Diabetes  Brother   . Heart attack Brother   . Breast cancer Neg Hx    Social History   Socioeconomic History  . Marital status: Widowed    Spouse name: Gershon Mussel  . Number of children: 2  . Years of education: some college  . Highest education level: 12th grade  Occupational History  . Occupation: Retired  Scientific laboratory technician  . Financial resource strain: Not hard at all  . Food insecurity    Worry: Never true    Inability: Never true  . Transportation needs    Medical: No    Non-medical: No  Tobacco Use  . Smoking status: Never Smoker  . Smokeless tobacco: Never Used  . Tobacco comment: smoking cessation materials not required  Substance and Sexual Activity  . Alcohol use: No  . Drug use: No  . Sexual activity: Not Currently    Birth control/protection: Surgical    Comment: TAH-1989  Lifestyle  . Physical activity    Days per week: 0 days    Minutes per session: 0 min  . Stress: Not at all  Relationships  . Social connections    Talks on phone: More than three times a week    Gets together: Three times a week    Attends religious service: More than 4 times per year    Active member of club or organization: Yes    Attends meetings of clubs or organizations: More than 4 times per year    Relationship status: Widowed  Other Topics Concern  . Not on file  Social History Narrative   She has been a widow since 2009    Outpatient Encounter Medications as of 07/20/2018  Medication Sig  . alendronate (FOSAMAX) 35 MG tablet TAKE 1 TABLET BY MOUTH EVERY 7 DAYS. TAKE WITH WATER ON AN EMPTY STOMACH.  Marland Kitchen ASPIRIN LOW DOSE 81 MG tablet Take 1 tablet by mouth daily.   Marland Kitchen azelastine (ASTELIN) 0.1 % nasal spray PLACE 2 SPRAYS INTO BOTH NOSTRILS 2 (TWO) TIMES DAILY. USE IN EACH NOSTRIL AS DIRECTED  . Calcium Polycarbophil (FIBERCON PO) Take 1 tablet by mouth at bedtime.   . cholecalciferol (VITAMIN D) 1000 units tablet Take 1 capsule by mouth daily.  . famotidine (PEPCID) 40 MG tablet Take 1 tablet (40  mg total) by mouth daily.  . Fish Oil-Cholecalciferol (FISH OIL + D3) 1200-1000 MG-UNIT CAPS Take 1 capsule by mouth daily.  . irbesartan (AVAPRO) 75 MG tablet Take 1 tablet (75 mg total) by mouth daily.  Marland Kitchen latanoprost (XALATAN) 0.005 % ophthalmic solution PLACE 1 DROP INTO BOTH EYES AT BEDTIME  . Multiple Vitamin (MULTIVITAMIN) tablet Take 1 tablet by mouth daily.  . pravastatin (PRAVACHOL) 80 MG tablet Take 1 tablet (80 mg total) by mouth daily.  Marland Kitchen SYNTHROID 88 MCG tablet TAKE 1 TABLET BY MOUTH DAILY. TAKE 1 AND 1/2 TABLETS ON TUESDAYS AND THURSDAYS  . topiramate (TOPAMAX) 50 MG tablet Take 1 tablet (  50 mg total) by mouth every evening.  . vitamin C (ASCORBIC ACID) 500 MG tablet Take 500 mg by mouth daily.   No facility-administered encounter medications on file as of 07/20/2018.     Activities of Daily Living In your present state of health, do you have any difficulty performing the following activities: 07/20/2018 01/23/2018  Hearing? N N  Comment declines hearing aids -  Vision? N Y  Comment does not drive at night -  Difficulty concentrating or making decisions? N N  Walking or climbing stairs? N N  Dressing or bathing? N N  Doing errands, shopping? N N  Preparing Food and eating ? N -  Using the Toilet? N -  In the past six months, have you accidently leaked urine? N -  Do you have problems with loss of bowel control? N -  Managing your Medications? N -  Managing your Finances? N -  Housekeeping or managing your Housekeeping? N -  Some recent data might be hidden    Patient Care Team: Steele Sizer, MD as PCP - General (Family Medicine) Lavonia Dana, MD as Consulting Physician (Nephrology) Dasher, Rayvon Char, MD as Consulting Physician (Dermatology) Kem Boroughs, FNP as Nurse Practitioner (Gynecology)    Assessment:   This is a routine wellness examination for [place].  Exercise Activities and Dietary recommendations Current Exercise Habits: The patient does not  participate in regular exercise at present, Exercise limited by: None identified  Goals    . DIET - INCREASE WATER INTAKE     Recommend to drink at least 6-8 8oz glasses of water per day.       Fall Risk Fall Risk  07/20/2018 01/23/2018 07/18/2017 07/18/2017 01/16/2017  Falls in the past year? 0 0 Yes No No  Comment - - slipped in puddle of water. Admitted to SNF for rehab - -  Number falls in past yr: 0 - 1 - -  Injury with Fall? 0 - Yes - -  Comment - - - - -  Risk for fall due to : - - Other (Comment);Impaired vision - -  Risk for fall due to: Comment - - knee pain, dyspnea; wears eyeglasses; glaucoma and B cataracts - -  Follow up Falls prevention discussed - Falls evaluation completed;Education provided;Falls prevention discussed - -   FALL RISK PREVENTION PERTAINING TO THE HOME:  Any stairs in or around the home? Yes  If so, do they handrails? Yes   Home free of loose throw rugs in walkways, pet beds, electrical cords, etc? Yes  Adequate lighting in your home to reduce risk of falls? Yes   ASSISTIVE DEVICES UTILIZED TO PREVENT FALLS:  Life alert? No  Use of a cane, walker or w/c? No  Grab bars in the bathroom? No  Shower chair or bench in shower? Yes Elevated toilet seat or a handicapped toilet? Yes   DME ORDERS:  DME order needed?  No   TIMED UP AND GO:  Was the test performed? No . Telephonic visit.   Education: Fall risk prevention has been discussed.  Intervention(s) required? No    Depression Screen PHQ 2/9 Scores 07/20/2018 01/23/2018 07/18/2017 07/18/2017  PHQ - 2 Score 0 1 0 0  PHQ- 9 Score - 7 0 1  Exception Documentation - (No Data) - -     Cognitive Function     6CIT Screen 07/20/2018 07/18/2017  What Year? 0 points 0 points  What month? 0 points 0 points  What time? 0 points  0 points  Count back from 20 0 points 0 points  Months in reverse 0 points 0 points  Repeat phrase 0 points 0 points  Total Score 0 0    Immunization History  Administered  Date(s) Administered  . Influenza Split 11/01/2006, 10/16/2008  . Influenza, High Dose Seasonal PF 12/12/2014, 09/28/2015, 10/18/2016, 10/13/2017  . Influenza, Seasonal, Injecte, Preservative Fre 09/07/2010, 01/16/2012  . Influenza,inj,Quad PF,6+ Mos 12/10/2012, 10/14/2013  . Pneumococcal Conjugate-13 12/17/2013  . Pneumococcal Polysaccharide-23 09/28/2015  . Tdap 03/03/2010  . Zoster 11/28/2007    Qualifies for Shingles Vaccine? Yes  Zostavax completed 2009. Due for Shingrix. Education has been provided regarding the importance of this vaccine. Pt has been advised to call insurance company to determine out of pocket expense. Advised may also receive vaccine at local pharmacy or Health Dept. Verbalized acceptance and understanding.  Tdap: Up to date  Flu Vaccine: Up to date  Pneumococcal Vaccine: Up to date   Screening Tests Health Maintenance  Topic Date Due  . INFLUENZA VACCINE  08/11/2018  . MAMMOGRAM  08/25/2018  . TETANUS/TDAP  03/03/2020  . DEXA SCAN  Completed  . PNA vac Low Risk Adult  Completed   Cancer Screenings:  Colorectal Screening: Completed 09/04/08. Repeat every 10 years. Pt to discuss if repeat screening necessary at next visit in August. Her only concern is hx of diverticuli.   Mammogram: Completed 08/24/17. Repeat every year. Ordered today. Pt provided with contact information and advised to call to schedule appt.   Bone Density: Completed 08/15/16. Results reflect OSTEOPENIA. Repeat every 2 years. Currently taking fosamax.   Lung Cancer Screening: (Low Dose CT Chest recommended if Age 27-80 years, 30 pack-year currently smoking OR have quit w/in 15years.) does not qualify.   Additional Screening:  Hepatitis C Screening: no longer required.   Vision Screening: Recommended annual ophthalmology exams for early detection of glaucoma and other disorders of the eye. Is the patient up to date with their annual eye exam?  Yes  Who is the provider or what is the  name of the office in which the pt attends annual eye exams? [place] Screening: Recommended annual dental exams for proper oral hygiene  Community Resource Referral:  CRR required this visit?  No      Plan:     I have personally reviewed and addressed the Medicare Annual Wellness questionnaire and have noted the following in the patient's chart:  A. Medical and social history B. Use of alcohol, tobacco or illicit drugs  C. Current medications and supplements D. Functional ability and status E.  Nutritional status F.  Physical activity G. Advance directives H. List of other physicians I.  Hospitalizations, surgeries, and ER visits in previous 12 months J.  [place] such as hearing and vision if needed, cognitive and depression L. Referrals and appointments   In addition, I have reviewed and discussed with patient certain preventive protocols, quality metrics, and best practice recommendations. A written personalized care plan for preventive services as well as general preventive health recommendations were provided to patient.   Signed,  Clemetine Marker, LPN Nurse Health Advisor   Nurse Notes: pt doing well and appreciative of visit today.

## 2018-07-20 NOTE — Patient Instructions (Signed)
Theresa Hall , Thank you for taking time to come for your Medicare Wellness Visit. I appreciate your ongoing commitment to your health goals. Please review the following plan we discussed and let me know if I can assist you in the future.   Screening recommendations/referrals: Colonoscopy: done 09/04/08 Mammogram: done 08/24/17. Please call [phone] to schedule your mammogram.  Bone Density: done 08/15/16 Recommended yearly ophthalmology/optometry visit for glaucoma screening and checkup Recommended yearly dental visit for hygiene and checkup  Vaccinations: Influenza vaccine: done 10/13/17 Pneumococcal vaccine: done 09/28/15 Tdap vaccine: done 03/03/10 Shingles vaccine: Shingrix discussed. Please contact your pharmacy for coverage information.   Advanced directives: Please bring a copy of your health care power of attorney and living will to the office at your convenience.  Conditions/risks identified: Keep up the great work!  Next appointment: Please follow up in one year for your Medicare Annual Wellness visit.     Preventive Care 54 Years and Older, Female Preventive care refers to lifestyle choices and visits with your health care provider that can promote health and wellness. What does preventive care include?  A yearly physical exam. This is also called an annual well check.  Dental exams once or twice a year.  Routine eye exams. Ask your health care provider how often you should have your eyes checked.  Personal lifestyle choices, including:  Daily care of your teeth and gums.  Regular physical activity.  Eating a healthy diet.  Avoiding tobacco and drug use.  Limiting alcohol use.  Practicing safe sex.  Taking low-dose aspirin every day.  Taking vitamin and mineral supplements as recommended by your health care provider. What happens during an annual well check? The services and screenings done by your health care provider during your annual well check will  depend on your age, overall health, lifestyle risk factors, and family history of disease. Counseling  Your health care provider may ask you questions about your:  Alcohol use.  Tobacco use.  Drug use.  Emotional well-being.  Home and relationship well-being.  Sexual activity.  Eating habits.  History of falls.  Memory and ability to understand (cognition).  Work and work Statistician.  Reproductive health. Screening  You may have the following tests or measurements:  Height, weight, and BMI.  Blood pressure.  Lipid and cholesterol levels. These may be checked every 5 years, or more frequently if you are over 26 years old.  Skin check.  Lung cancer screening. You may have this screening every year starting at age 2 if you have a 30-pack-year history of smoking and currently smoke or have quit within the past 15 years.  Fecal occult blood test (FOBT) of the stool. You may have this test every year starting at age 55.  Flexible sigmoidoscopy or colonoscopy. You may have a sigmoidoscopy every 5 years or a colonoscopy every 10 years starting at age 3.  Hepatitis C blood test.  Hepatitis B blood test.  Sexually transmitted disease (STD) testing.  Diabetes screening. This is done by checking your blood sugar (glucose) after you have not eaten for a while (fasting). You may have this done every 1-3 years.  Bone density scan. This is done to screen for osteoporosis. You may have this done starting at age 66.  Mammogram. This may be done every 1-2 years. Talk to your health care provider about how often you should have regular mammograms. Talk with your health care provider about your test results, treatment options, and if necessary, the need for  more tests. Vaccines  Your health care provider may recommend certain vaccines, such as:  Influenza vaccine. This is recommended every year.  Tetanus, diphtheria, and acellular pertussis (Tdap, Td) vaccine. You may need a  Td booster every 10 years.  Zoster vaccine. You may need this after age 40.  Pneumococcal 13-valent conjugate (PCV13) vaccine. One dose is recommended after age 92.  Pneumococcal polysaccharide (PPSV23) vaccine. One dose is recommended after age 51. Talk to your health care provider about which screenings and vaccines you need and how often you need them. This information is not intended to replace advice given to you by your health care provider. Make sure you discuss any questions you have with your health care provider. Document Released: 01/23/2015 Document Revised: 09/16/2015 Document Reviewed: 10/28/2014 Elsevier Interactive Patient Education  2017 [place] Prevention in the Home Falls can cause injuries. They can happen to people of all ages. There are many things you can do to make your home safe and to help prevent falls. What can I do on the outside of my home?  Regularly fix the edges of walkways and driveways and fix any cracks.  Remove anything that might make you trip as you walk through a door, such as a raised step or threshold.  Trim any bushes or trees on the path to your home.  Use bright outdoor lighting.  Clear any walking paths of anything that might make someone trip, such as rocks or tools.  Regularly check to see if handrails are loose or broken. Make sure that both sides of any steps have handrails.  Any raised decks and porches should have guardrails on the edges.  Have any leaves, snow, or ice cleared regularly.  Use sand or salt on walking paths during winter.  Clean up any spills in your garage right away. This includes oil or grease spills. What can I do in the bathroom?  Use night lights.  Install grab bars by the toilet and in the tub and shower. Do not use towel bars as grab bars.  Use non-skid mats or decals in the tub or shower.  If you need to sit down in the shower, use a plastic, non-slip stool.  Keep the floor dry. Clean  up any water that spills on the floor as soon as it happens.  Remove soap buildup in the tub or shower regularly.  Attach bath mats securely with double-sided non-slip rug tape.  Do not have throw rugs and other things on the floor that can make you trip. What can I do in the bedroom?  Use night lights.  Make sure that you have a light by your bed that is easy to reach.  Do not use any sheets or blankets that are too big for your bed. They should not hang down onto the floor.  Have a firm chair that has side arms. You can use this for support while you get dressed.  Do not have throw rugs and other things on the floor that can make you trip. What can I do in the kitchen?  Clean up any spills right away.  Avoid walking on wet floors.  Keep items that you use a lot in easy-to-reach places.  If you need to reach something above you, use a strong step stool that has a grab bar.  Keep electrical cords out of the way.  Do not use floor polish or wax that makes floors slippery. If you must use wax, use non-skid  floor wax.  Do not have throw rugs and other things on the floor that can make you trip. What can I do with my stairs?  Do not leave any items on the stairs.  Make sure that there are handrails on both sides of the stairs and use them. Fix handrails that are broken or loose. Make sure that handrails are as long as the stairways.  Check any carpeting to make sure that it is firmly attached to the stairs. Fix any carpet that is loose or worn.  Avoid having throw rugs at the top or bottom of the stairs. If you do have throw rugs, attach them to the floor with carpet tape.  Make sure that you have a light switch at the top of the stairs and the bottom of the stairs. If you do not have them, ask someone to add them for you. What else can I do to help prevent falls?  Wear shoes that:  Do not have high heels.  Have rubber bottoms.  Are comfortable and fit you well.  Are  closed at the toe. Do not wear sandals.  If you use a stepladder:  Make sure that it is fully opened. Do not climb a closed stepladder.  Make sure that both sides of the stepladder are locked into place.  Ask someone to hold it for you, if possible.  Clearly mark and make sure that you can see:  Any grab bars or handrails.  First and last steps.  Where the edge of each step is.  Use tools that help you move around (mobility aids) if they are needed. These include:  Canes.  Walkers.  Scooters.  Crutches.  Turn on the lights when you go into a dark area. Replace any light bulbs as soon as they burn out.  Set up your furniture so you have a clear path. Avoid moving your furniture around.  If any of your floors are uneven, fix them.  If there are any pets around you, be aware of where they are.  Review your medicines with your doctor. Some medicines can make you feel dizzy. This can increase your chance of falling. Ask your doctor what other things that you can do to help prevent falls. This information is not intended to replace advice given to you by your health care provider. Make sure you discuss any questions you have with your health care provider. Document Released: 10/23/2008 Document Revised: 06/04/2015 Document Reviewed: 01/31/2014 Elsevier Interactive Patient Education  2017 [employer].

## 2018-07-23 DIAGNOSIS — N2581 Secondary hyperparathyroidism of renal origin: Secondary | ICD-10-CM | POA: Diagnosis not present

## 2018-07-23 DIAGNOSIS — I129 Hypertensive chronic kidney disease with stage 1 through stage 4 chronic kidney disease, or unspecified chronic kidney disease: Secondary | ICD-10-CM | POA: Diagnosis not present

## 2018-07-23 DIAGNOSIS — N183 Chronic kidney disease, stage 3 (moderate): Secondary | ICD-10-CM | POA: Diagnosis not present

## 2018-07-25 ENCOUNTER — Ambulatory Visit: Payer: Medicare HMO | Admitting: Family Medicine

## 2018-07-26 DIAGNOSIS — Z08 Encounter for follow-up examination after completed treatment for malignant neoplasm: Secondary | ICD-10-CM | POA: Diagnosis not present

## 2018-07-26 DIAGNOSIS — D2262 Melanocytic nevi of left upper limb, including shoulder: Secondary | ICD-10-CM | POA: Diagnosis not present

## 2018-07-26 DIAGNOSIS — D2272 Melanocytic nevi of left lower limb, including hip: Secondary | ICD-10-CM | POA: Diagnosis not present

## 2018-07-26 DIAGNOSIS — D225 Melanocytic nevi of trunk: Secondary | ICD-10-CM | POA: Diagnosis not present

## 2018-07-26 DIAGNOSIS — Z85820 Personal history of malignant melanoma of skin: Secondary | ICD-10-CM | POA: Diagnosis not present

## 2018-07-26 DIAGNOSIS — D2261 Melanocytic nevi of right upper limb, including shoulder: Secondary | ICD-10-CM | POA: Diagnosis not present

## 2018-07-26 DIAGNOSIS — L308 Other specified dermatitis: Secondary | ICD-10-CM | POA: Diagnosis not present

## 2018-07-26 DIAGNOSIS — D2271 Melanocytic nevi of right lower limb, including hip: Secondary | ICD-10-CM | POA: Diagnosis not present

## 2018-07-26 DIAGNOSIS — D485 Neoplasm of uncertain behavior of skin: Secondary | ICD-10-CM | POA: Diagnosis not present

## 2018-07-26 DIAGNOSIS — L821 Other seborrheic keratosis: Secondary | ICD-10-CM | POA: Diagnosis not present

## 2018-08-01 DIAGNOSIS — L089 Local infection of the skin and subcutaneous tissue, unspecified: Secondary | ICD-10-CM | POA: Diagnosis not present

## 2018-08-01 DIAGNOSIS — S60465A Insect bite (nonvenomous) of left ring finger, initial encounter: Secondary | ICD-10-CM | POA: Diagnosis not present

## 2018-08-01 DIAGNOSIS — W57XXXA Bitten or stung by nonvenomous insect and other nonvenomous arthropods, initial encounter: Secondary | ICD-10-CM | POA: Diagnosis not present

## 2018-08-13 ENCOUNTER — Encounter: Payer: Self-pay | Admitting: Family Medicine

## 2018-08-13 ENCOUNTER — Ambulatory Visit (INDEPENDENT_AMBULATORY_CARE_PROVIDER_SITE_OTHER): Payer: Medicare HMO | Admitting: Family Medicine

## 2018-08-13 ENCOUNTER — Other Ambulatory Visit: Payer: Self-pay

## 2018-08-13 VITALS — BP 128/60 | HR 91 | Temp 96.6°F | Resp 16 | Ht 64.0 in | Wt 158.4 lb

## 2018-08-13 DIAGNOSIS — K219 Gastro-esophageal reflux disease without esophagitis: Secondary | ICD-10-CM | POA: Diagnosis not present

## 2018-08-13 DIAGNOSIS — E039 Hypothyroidism, unspecified: Secondary | ICD-10-CM

## 2018-08-13 DIAGNOSIS — N183 Chronic kidney disease, stage 3 unspecified: Secondary | ICD-10-CM

## 2018-08-13 DIAGNOSIS — E785 Hyperlipidemia, unspecified: Secondary | ICD-10-CM

## 2018-08-13 DIAGNOSIS — M8580 Other specified disorders of bone density and structure, unspecified site: Secondary | ICD-10-CM | POA: Diagnosis not present

## 2018-08-13 DIAGNOSIS — G43009 Migraine without aura, not intractable, without status migrainosus: Secondary | ICD-10-CM

## 2018-08-13 DIAGNOSIS — I10 Essential (primary) hypertension: Secondary | ICD-10-CM

## 2018-08-13 MED ORDER — ALENDRONATE SODIUM 35 MG PO TABS: ORAL_TABLET | ORAL | 1 refills | Status: DC

## 2018-08-13 MED ORDER — IRBESARTAN 75 MG PO TABS: 75.0000 mg | ORAL_TABLET | Freq: Every day | ORAL | 1 refills | Status: DC

## 2018-08-13 MED ORDER — TOPIRAMATE 50 MG PO TABS: 50.0000 mg | ORAL_TABLET | Freq: Every evening | ORAL | 1 refills | Status: DC

## 2018-08-13 MED ORDER — FAMOTIDINE 40 MG PO TABS: 40.0000 mg | ORAL_TABLET | Freq: Every day | ORAL | 1 refills | Status: DC

## 2018-08-13 NOTE — Patient Instructions (Signed)
Preventive Care 38 Years and Older, Female Preventive care refers to lifestyle choices and visits with your health care provider that can promote health and wellness. This includes:  A yearly physical exam. This is also called an annual well check.  Regular dental and eye exams.  Immunizations.  Screening for certain conditions.  Healthy lifestyle choices, such as diet and exercise. What can I expect for my preventive care visit? Physical exam Your health care provider will check:  Height and weight. These may be used to calculate body mass index (BMI), which is a measurement that tells if you are at a healthy weight.  Heart rate and blood pressure.  Your skin for abnormal spots. Counseling Your health care provider may ask you questions about:  Alcohol, tobacco, and drug use.  Emotional well-being.  Home and relationship well-being.  Sexual activity.  Eating habits.  History of falls.  Memory and ability to understand (cognition).  Work and work Statistician.  Pregnancy and menstrual history. What immunizations do I need?  Influenza (flu) vaccine  This is recommended every year. Tetanus, diphtheria, and pertussis (Tdap) vaccine  You may need a Td booster every 10 years. Varicella (chickenpox) vaccine  You may need this vaccine if you have not already been vaccinated. Zoster (shingles) vaccine  You may need this after age 33. Pneumococcal conjugate (PCV13) vaccine  One dose is recommended after age 33. Pneumococcal polysaccharide (PPSV23) vaccine  One dose is recommended after age 72. Measles, mumps, and rubella (MMR) vaccine  You may need at least one dose of MMR if you were born in 1957 or later. You may also need a second dose. Meningococcal conjugate (MenACWY) vaccine  You may need this if you have certain conditions. Hepatitis A vaccine  You may need this if you have certain conditions or if you travel or work in places where you may be exposed  to hepatitis A. Hepatitis B vaccine  You may need this if you have certain conditions or if you travel or work in places where you may be exposed to hepatitis B. Haemophilus influenzae type b (Hib) vaccine  You may need this if you have certain conditions. You may receive vaccines as individual doses or as more than one vaccine together in one shot (combination vaccines). Talk with your health care provider about the risks and benefits of combination vaccines. What tests do I need? Blood tests  Lipid and cholesterol levels. These may be checked every 5 years, or more frequently depending on your overall health.  Hepatitis C test.  Hepatitis B test. Screening  Lung cancer screening. You may have this screening every year starting at age 39 if you have a 30-pack-year history of smoking and currently smoke or have quit within the past 15 years.  Colorectal cancer screening. All adults should have this screening starting at age 36 and continuing until age 15. Your health care provider may recommend screening at age 23 if you are at increased risk. You will have tests every 1-10 years, depending on your results and the type of screening test.  Diabetes screening. This is done by checking your blood sugar (glucose) after you have not eaten for a while (fasting). You may have this done every 1-3 years.  Mammogram. This may be done every 1-2 years. Talk with your health care provider about how often you should have regular mammograms.  BRCA-related cancer screening. This may be done if you have a family history of breast, ovarian, tubal, or peritoneal cancers.  Other tests  Sexually transmitted disease (STD) testing.  Bone density scan. This is done to screen for osteoporosis. You may have this done starting at age 55. Follow these instructions at home: Eating and drinking  Eat a diet that includes fresh fruits and vegetables, whole grains, lean protein, and low-fat dairy products. Limit  your intake of foods with high amounts of sugar, saturated fats, and salt.  Take vitamin and mineral supplements as recommended by your health care provider.  Do not drink alcohol if your health care provider tells you not to drink.  If you drink alcohol: ? Limit how much you have to 0-1 drink a day. ? Be aware of how much alcohol is in your drink. In the [place] one drink equals one 12 oz bottle of beer (355 mL), one 5 oz glass of wine (148 mL), or one 1 oz glass of hard liquor (44 mL). Lifestyle  Take daily care of your teeth and gums.  Stay active. Exercise for at least 30 minutes on 5 or more days each week.  Do not use any products that contain nicotine or tobacco, such as cigarettes, e-cigarettes, and chewing tobacco. If you need help quitting, ask your health care provider.  If you are sexually active, practice safe sex. Use a condom or other form of protection in order to prevent STIs (sexually transmitted infections).  Talk with your health care provider about taking a low-dose aspirin or statin. What's next?  Go to your health care provider once a year for a well check visit.  Ask your health care provider how often you should have your eyes and teeth checked.  Stay up to date on all vaccines. This information is not intended to replace advice given to you by your health care provider. Make sure you discuss any questions you have with your health care provider. Document Released: 01/23/2015 Document Revised: 12/21/2017 Document Reviewed: 12/21/2017 Elsevier Patient Education  2020 [employer].

## 2018-08-13 NOTE — Progress Notes (Signed)
Name: Theresa Hall   MRN: [id number]    DOB: [birth year]   Date:08/13/2018       Progress Note  Subjective  Chief Complaint  Chief Complaint  Patient presents with  . Medication Refill  . Chronic Kidney Disease  . Hypertension  . Dyslipidemia  . Hyperlipidemia  . Hypothyroidism  . Gastroesophageal Reflux  . Migraine    HPI   Patient presents for annual CPE and follow up  HTN and she has a history of CKI stage III: last labs showed GFR was43Seeing Dr. Abigail Butts yearly.BPis elevated today l, on low dose of Avapro no chest pain or palpitation . She states at home bp is usually at goal, it was also normal when she went to see Dr. Abigail Butts we will monitor for now   Hypothyroidism: last TSH wasback to normal, she is taking 88 mcg daily and one and a half T and T andone daily other days of the week.No ahir loss, palpitation or change in bowel movements   Migraine headaches: she had it for many years, she is doing well on Topamax at night. No recent episodes. Now usually triggered by weather changes   Hyperlipidemia: taking medication and denies side effects, reviewed labs done January 2020  LDL was at goal at 37  Osteopenia: discussed results, discussed high calcium and vitamin D diet and shehas been taking low dose fosamax without side effects.Repeat this Fall  , gave her a card   GERD: she states symptoms are intermittent, she eats dinner around 7 pm and has occasional  indigestion after she eats, but resolves by the time she goes to bed. She is now on Famotine, but discussed risk of worsening of kidney function, she will try taking Tums prn  She states she had severe episode recently when she went out to eat recently  Diet: she eats a bland diet Exercise: she has been stretching daily but will start to walk more   USPSTF grade A and B recommendations    [facility] Visit [facility] 08/13/2018 in [facility]  AUDIT-C Score  0     Hypertension: BP Readings  from Last 3 Encounters:  08/13/18 128/60  07/20/18 (!) 141/72  01/23/18 128/74   Obesity: Wt Readings from Last 3 Encounters:  08/13/18 158 lb 6.4 oz (71.8 kg)  07/20/18 155 lb (70.3 kg)  01/23/18 159 lb 12.8 oz (72.5 kg)   BMI Readings from Last 3 Encounters:  08/13/18 27.19 kg/m  07/20/18 26.61 kg/m  01/23/18 27.43 kg/m    Hep C Screening: discussed with patient today  STD testing and prevention (HIV/chl/gon/syphilis): N/A Intimate partner violence:denied  Sexual History/Pain during Intercourse: N/A Menstrual History/LMP/Abnormal Bleeding: discussed postmenopausal bleeding  Incontinence Symptoms: very seldom has stress incontinence   Advanced Care Planning: A voluntary discussion about advance care planning including the explanation and discussion of advance directives.  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   Breast cancer: due for repeat  BRCA gene screening: N/a Cervical cancer screening: N/A  Osteoporosis Screening: due for repeat this Fall   Lipids:  Lab Results  Component Value Date   CHOL 152 01/23/2018   CHOL 163 07/18/2017   CHOL 147 06/24/2016   Lab Results  Component Value Date   HDL 49 (L) 01/23/2018   HDL 50 (L) 07/18/2017  HDL 61 06/24/2016   Lab Results  Component Value Date   LDLCALC 83 01/23/2018   LDLCALC 76 07/18/2017   LDLCALC 65 06/24/2016   Lab Results  Component Value Date   TRIG 106 01/23/2018   TRIG 285 (H) 07/18/2017   TRIG 103 06/24/2016   Lab Results  Component Value Date   CHOLHDL 3.1 01/23/2018   CHOLHDL 3.3 07/18/2017   CHOLHDL 2.4 06/24/2016   No results found for: LDLDIRECT  Glucose:  Glucose, Bld  Date Value Ref Range Status  01/23/2018 101 (H) 65 - 99 mg/dL Final    Comment:    .            Fasting reference interval . For someone without known  diabetes, a glucose value between 100 and 125 mg/dL is consistent with prediabetes and should be confirmed with a follow-up test. .   01/16/2017 92 65 - 99 mg/dL Final    Comment:    .            Fasting reference interval .   06/24/2016 98 65 - 99 mg/dL Final    Skin cancer: discussed atypical lesions  Lung cancer:  Low Dose CT Chest recommended if Age 33-80 years, 30 pack-year currently smoking OR have quit w/in 15years. Patient does not qualify.   ECG:08/2015   Patient Active Problem List   Diagnosis Date Noted  . BCC (basal cell carcinoma of skin) 10/16/2017  . Secondary hyperparathyroidism of renal origin ([place]) 07/18/2017  . Osteopenia 08/15/2016  . Closed displaced comminuted fracture of right patella 08/20/2015  . Reflux esophagitis 06/23/2014  . HLD (hyperlipidemia) 06/23/2014  . Palpitations 06/23/2014  . Chronic kidney disease (CKD), stage III (moderate) ([place]) 08/24/2009  . History of diverticulitis 05/14/2008  . Adult hypothyroidism 12/04/2006  . Migraine without aura 08/02/2006  . 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 APPENDECTOMY    . BASAL CELL CARCINOMA EXCISION    . BASAL CELL CARCINOMA EXCISION  2014   nasal  . CHOLECYSTECTOMY    . COLONOSCOPY    . ORIF PATELLA Right 08/15/2015   Procedure: OPEN REDUCTION INTERNAL (ORIF) FIXATION PATELLA  and application wound vac;  Surgeon: Corky Mull, MD;  Location: [facility] ORS;  Service: Orthopedics;  Laterality: Right;  . TONSILLECTOMY    . VAGINAL HYSTERECTOMY      Family History  Problem Relation Age of Onset  . Heart attack Father        x2  . Heart disease Sister   . Kidney disease Sister   . Diabetes Sister   . Heart attack Brother   . Diabetes Brother   . Heart attack Brother   . Breast cancer Neg Hx     Social History   Socioeconomic History  . Marital status: Widowed    Spouse name: Gershon Mussel  . Number of children: 2  . Years of  education: some college  . Highest education level: 12th grade  Occupational History  . Occupation: Retired  Scientific laboratory technician  . Financial resource strain: Not hard at all  . Food insecurity    Worry: Never true    Inability: Never true  . Transportation needs    Medical: No    Non-medical: No  Tobacco Use  . Smoking status: Never Smoker  . Smokeless tobacco: Never Used  . Tobacco comment: smoking cessation materials not required  Substance and Sexual Activity  . Alcohol use: No  .  Drug use: No  . Sexual activity: Not Currently    Birth control/protection: Surgical    Comment: TAH-1989  Lifestyle  . Physical activity    Days per week: 7 days    Minutes per session: 10 min  . Stress: Not at all  Relationships  . Social connections    Talks on phone: More than three times a week    Gets together: Three times a week    Attends religious service: More than 4 times per year    Active member of club or organization: Yes    Attends meetings of clubs or organizations: More than 4 times per year    Relationship status: Widowed  . Intimate partner violence    Fear of current or ex partner: No    Emotionally abused: No    Physically abused: No    Forced sexual activity: No  Other Topics Concern  . Not on file  Social History Narrative   She has been a widow since 2009     Current Outpatient Medications:  .  alendronate (FOSAMAX) 35 MG tablet, TAKE 1 TABLET BY MOUTH EVERY 7 DAYS. TAKE WITH WATER ON AN EMPTY STOMACH., Disp: 12 tablet, Rfl: 1 .  ASPIRIN LOW DOSE 81 MG tablet, Take 1 tablet by mouth daily. , Disp: , Rfl:  .  azelastine (ASTELIN) 0.1 % nasal spray, PLACE 2 SPRAYS INTO BOTH NOSTRILS 2 (TWO) TIMES DAILY. USE IN EACH NOSTRIL AS DIRECTED, Disp: 30 mL, Rfl: 2 .  Calcium Polycarbophil (FIBERCON PO), Take 1 tablet by mouth at bedtime. , Disp: , Rfl:  .  cephALEXin (KEFLEX) 500 MG capsule, TAKE 1 CAPSULE BY MOUTH EVERY 6 HOURS FOR 10 DAYS, Disp: , Rfl:  .  cholecalciferol  (VITAMIN D) 1000 units tablet, Take 1 capsule by mouth daily., Disp: , Rfl:  .  famotidine (PEPCID) 40 MG tablet, Take 1 tablet (40 mg total) by mouth daily., Disp: 90 tablet, Rfl: 1 .  Fish Oil-Cholecalciferol (FISH OIL + D3) 1200-1000 MG-UNIT CAPS, Take 1 capsule by mouth daily., Disp: , Rfl:  .  irbesartan (AVAPRO) 75 MG tablet, Take 1 tablet (75 mg total) by mouth daily., Disp: 90 tablet, Rfl: 1 .  latanoprost (XALATAN) 0.005 % ophthalmic solution, PLACE 1 DROP INTO BOTH EYES AT BEDTIME, Disp: , Rfl: 5 .  Multiple Vitamin (MULTIVITAMIN) tablet, Take 1 tablet by mouth daily., Disp: , Rfl:  .  pravastatin (PRAVACHOL) 80 MG tablet, Take 1 tablet (80 mg total) by mouth daily., Disp: 90 tablet, Rfl: 3 .  SYNTHROID 88 MCG tablet, TAKE 1 TABLET BY MOUTH DAILY. TAKE 1 AND 1/2 TABLETS ON TUESDAYS AND THURSDAYS, Disp: 112 tablet, Rfl: 1 .  topiramate (TOPAMAX) 50 MG tablet, Take 1 tablet (50 mg total) by mouth every evening., Disp: 90 tablet, Rfl: 1 .  vitamin C (ASCORBIC ACID) 500 MG tablet, Take 500 mg by mouth daily., Disp: , Rfl:   Allergies  Allergen Reactions  . Codeine Other (See Comments)    Patient had insomnia and nausea  . Oxycodone Other (See Comments)    Made her "Loopy"     ROS  Constitutional: Negative for fever or weight change.  Respiratory: Negative for cough and shortness of breath.   Cardiovascular: Negative for chest pain or palpitations.  Gastrointestinal: Negative for abdominal pain, no bowel changes.  Musculoskeletal: Negative for gait problem or joint swelling.  Skin: Negative for rash.  Neurological: Negative for dizziness or headache.  No other specific complaints  in a complete review of systems (except as listed in HPI above).  Objective  Vitals:   08/13/18 1008 08/13/18 1052  BP: (!) 158/84 128/60  Pulse: 91   Resp: 16   Temp: (!) 96.6 F (35.9 C)   TempSrc: Temporal   SpO2: 98%   Weight: 158 lb 6.4 oz (71.8 kg)   Height: 5' 4"  (1.626 m)     Body  mass index is 27.19 kg/m.  Physical Exam  Constitutional: Patient appears well-developed and well-nourished. No distress.  HENT: Head: Normocephalic and atraumatic. Ears: B TMs ok, no erythema or effusion; Nose: Nose normal. Eyes: Conjunctivae and EOM are normal. Pupils are equal, round, and reactive to light. No scleral icterus.  Neck: Normal range of motion. Neck supple.  No thyromegaly present.  Cardiovascular: Normal rate, regular rhythm and normal heart sounds.  No murmur heard. No BLE edema. Pulmonary/Chest: Effort normal and breath sounds normal. No respiratory distress. Abdominal: Soft. Bowel sounds are normal, no distension. There is no tenderness. no masses Breast: no lumps or masses, no nipple discharge or rashes FEMALE GENITALIA:  Not done RECTAL: not done  Musculoskeletal: Normal range of motion, no joint effusions. No gross deformities Neurological: he is alert and oriented to person, place, and time. No cranial nerve deficit. Coordination, balance, strength, speech and gait are normal.  Skin: Skin is warm and dry. No rash noted. No erythema.  Psychiatric: Patient has a normal mood and affect. behavior is normal. Judgment and thought content normal.  PHQ2/9: Depression screen [facility] 2/9 08/13/2018 07/20/2018 01/23/2018 07/18/2017 07/18/2017  Decreased Interest 0 0 1 0 0  Down, Depressed, Hopeless 0 0 0 0 0  PHQ - 2 Score 0 0 1 0 0  Altered sleeping 0 - 0 0 0  Tired, decreased energy 0 - 3 0 1  Change in appetite 0 - 2 0 0  Feeling bad or failure about yourself  0 - 0 0 0  Trouble concentrating 0 - 1 0 0  Moving slowly or fidgety/restless 0 - 0 0 0  Suicidal thoughts 0 - 0 0 0  PHQ-9 Score 0 - 7 0 1  Difficult doing work/chores - - Somewhat difficult Not difficult at all Not difficult at all   Negative   Fall Risk: Fall Risk  08/13/2018 07/20/2018 01/23/2018 07/18/2017 07/18/2017  Falls in the past year? 0 0 0 Yes No  Comment - - - slipped in puddle of water. Admitted to SNF for  rehab -  Number falls in past yr: 0 0 - 1 -  Injury with Fall? 0 0 - Yes -  Comment - - - - -  Risk for fall due to : - - - Other (Comment);Impaired vision -  Risk for fall due to: Comment - - - knee pain, dyspnea; wears eyeglasses; glaucoma and B cataracts -  Follow up - Falls prevention discussed - Falls evaluation completed;Education provided;Falls prevention discussed -     Functional Status Survey: Is the patient deaf or have difficulty hearing?: No Does the patient have difficulty seeing, even when wearing glasses/contacts?: No Does the patient have difficulty concentrating, remembering, or making decisions?: No Does the patient have difficulty walking or climbing stairs?: No Does the patient have difficulty dressing or bathing?: No Does the patient have difficulty doing errands alone such as visiting a doctor's office or shopping?: No   Assessment & Plan  1. Osteopenia, unspecified location  - alendronate (FOSAMAX) 35 MG tablet; TAKE 1 TABLET BY MOUTH  EVERY 7 DAYS. TAKE WITH WATER ON AN EMPTY STOMACH.  Dispense: 12 tablet; Refill: 1 - DG Bone Density; Future  2. Hypertension, benign  - irbesartan (AVAPRO) 75 MG tablet; Take 1 tablet (75 mg total) by mouth daily.  Dispense: 90 tablet; Refill: 1  3. Chronic kidney disease, stage III (moderate) (HCC)  - irbesartan (AVAPRO) 75 MG tablet; Take 1 tablet (75 mg total) by mouth daily.  Dispense: 90 tablet; Refill: 1  4. Migraine without aura and without status migrainosus, not intractable  - topiramate (TOPAMAX) 50 MG tablet; Take 1 tablet (50 mg total) by mouth every evening.  Dispense: 90 tablet; Refill: 1  5. Dyslipidemia   6. Stage 3 chronic kidney disease ([place])   7. Adult hypothyroidism   8. GERD without esophagitis  - famotidine (PEPCID) 40 MG tablet; Take 1 tablet (40 mg total) by mouth daily.  Dispense: 90 tablet; Refill: 1  -USPSTF grade A and B recommendations reviewed with patient; age-appropriate  recommendations, preventive care, screening tests, etc discussed and encouraged; healthy living encouraged; see AVS for patient education given to patient -Discussed importance of 150 minutes of physical activity weekly, eat two servings of fish weekly, eat one serving of tree nuts ( cashews, pistachios, pecans, almonds.Marland Kitchen) every other day, eat 6 servings of fruit/vegetables daily and drink plenty of water and avoid sweet beverages.   -Reviewed Health Maintenance: *

## 2018-10-15 DIAGNOSIS — H401131 Primary open-angle glaucoma, bilateral, mild stage: Secondary | ICD-10-CM | POA: Diagnosis not present

## 2018-10-15 DIAGNOSIS — M3501 Sicca syndrome with keratoconjunctivitis: Secondary | ICD-10-CM | POA: Diagnosis not present

## 2018-10-15 DIAGNOSIS — H43399 Other vitreous opacities, unspecified eye: Secondary | ICD-10-CM | POA: Diagnosis not present

## 2018-10-16 ENCOUNTER — Ambulatory Visit (INDEPENDENT_AMBULATORY_CARE_PROVIDER_SITE_OTHER): Payer: Medicare HMO

## 2018-10-16 ENCOUNTER — Other Ambulatory Visit: Payer: Self-pay

## 2018-10-16 DIAGNOSIS — Z23 Encounter for immunization: Secondary | ICD-10-CM | POA: Diagnosis not present

## 2018-10-29 ENCOUNTER — Other Ambulatory Visit: Payer: Medicare HMO

## 2018-11-07 ENCOUNTER — Ambulatory Visit
Admission: RE | Admit: 2018-11-07 | Discharge: 2018-11-07 | Disposition: A | Discharge status: Home / Self Care | Payer: Medicare HMO | Source: Ambulatory Visit | Attending: Family Medicine | Admitting: Family Medicine

## 2018-11-07 DIAGNOSIS — Z1231 Encounter for screening mammogram for malignant neoplasm of breast: Secondary | ICD-10-CM | POA: Insufficient documentation

## 2018-11-07 DIAGNOSIS — Z780 Asymptomatic menopausal state: Secondary | ICD-10-CM | POA: Diagnosis not present

## 2018-11-07 DIAGNOSIS — M8580 Other specified disorders of bone density and structure, unspecified site: Secondary | ICD-10-CM | POA: Diagnosis present

## 2018-11-07 DIAGNOSIS — M8589 Other specified disorders of bone density and structure, multiple sites: Secondary | ICD-10-CM | POA: Insufficient documentation

## 2018-11-14 DIAGNOSIS — Z20828 Contact with and (suspected) exposure to other viral communicable diseases: Secondary | ICD-10-CM | POA: Diagnosis not present

## 2018-11-27 ENCOUNTER — Other Ambulatory Visit: Payer: Self-pay | Admitting: Family Medicine

## 2018-12-17 DIAGNOSIS — H401131 Primary open-angle glaucoma, bilateral, mild stage: Secondary | ICD-10-CM | POA: Diagnosis not present

## 2018-12-24 DIAGNOSIS — H401131 Primary open-angle glaucoma, bilateral, mild stage: Secondary | ICD-10-CM | POA: Diagnosis not present

## 2019-01-29 DIAGNOSIS — H401131 Primary open-angle glaucoma, bilateral, mild stage: Secondary | ICD-10-CM | POA: Diagnosis not present

## 2019-02-08 DIAGNOSIS — D485 Neoplasm of uncertain behavior of skin: Secondary | ICD-10-CM | POA: Diagnosis not present

## 2019-02-08 DIAGNOSIS — L309 Dermatitis, unspecified: Secondary | ICD-10-CM | POA: Diagnosis not present

## 2019-02-08 DIAGNOSIS — D2272 Melanocytic nevi of left lower limb, including hip: Secondary | ICD-10-CM | POA: Diagnosis not present

## 2019-02-08 DIAGNOSIS — D2261 Melanocytic nevi of right upper limb, including shoulder: Secondary | ICD-10-CM | POA: Diagnosis not present

## 2019-02-08 DIAGNOSIS — Z85828 Personal history of other malignant neoplasm of skin: Secondary | ICD-10-CM | POA: Diagnosis not present

## 2019-02-08 DIAGNOSIS — C44712 Basal cell carcinoma of skin of right lower limb, including hip: Secondary | ICD-10-CM | POA: Diagnosis not present

## 2019-02-08 DIAGNOSIS — Z85820 Personal history of malignant melanoma of skin: Secondary | ICD-10-CM | POA: Diagnosis not present

## 2019-02-08 DIAGNOSIS — L920 Granuloma annulare: Secondary | ICD-10-CM | POA: Diagnosis not present

## 2019-02-08 DIAGNOSIS — D225 Melanocytic nevi of trunk: Secondary | ICD-10-CM | POA: Diagnosis not present

## 2019-02-18 ENCOUNTER — Other Ambulatory Visit: Payer: Self-pay

## 2019-02-18 ENCOUNTER — Ambulatory Visit (INDEPENDENT_AMBULATORY_CARE_PROVIDER_SITE_OTHER): Payer: Medicare HMO | Admitting: Family Medicine

## 2019-02-18 ENCOUNTER — Encounter: Payer: Self-pay | Admitting: Family Medicine

## 2019-02-18 VITALS — BP 120/66 | HR 79 | Temp 96.9°F | Resp 16 | Ht 64.0 in | Wt 158.3 lb

## 2019-02-18 DIAGNOSIS — N2581 Secondary hyperparathyroidism of renal origin: Secondary | ICD-10-CM

## 2019-02-18 DIAGNOSIS — E039 Hypothyroidism, unspecified: Secondary | ICD-10-CM | POA: Diagnosis not present

## 2019-02-18 DIAGNOSIS — K219 Gastro-esophageal reflux disease without esophagitis: Secondary | ICD-10-CM

## 2019-02-18 DIAGNOSIS — R7309 Other abnormal glucose: Secondary | ICD-10-CM | POA: Diagnosis not present

## 2019-02-18 DIAGNOSIS — G43009 Migraine without aura, not intractable, without status migrainosus: Secondary | ICD-10-CM | POA: Diagnosis not present

## 2019-02-18 DIAGNOSIS — J302 Other seasonal allergic rhinitis: Secondary | ICD-10-CM

## 2019-02-18 DIAGNOSIS — R1030 Lower abdominal pain, unspecified: Secondary | ICD-10-CM

## 2019-02-18 DIAGNOSIS — M8580 Other specified disorders of bone density and structure, unspecified site: Secondary | ICD-10-CM | POA: Diagnosis not present

## 2019-02-18 DIAGNOSIS — I10 Essential (primary) hypertension: Secondary | ICD-10-CM

## 2019-02-18 DIAGNOSIS — J3089 Other allergic rhinitis: Secondary | ICD-10-CM

## 2019-02-18 DIAGNOSIS — E785 Hyperlipidemia, unspecified: Secondary | ICD-10-CM

## 2019-02-18 DIAGNOSIS — L309 Dermatitis, unspecified: Secondary | ICD-10-CM

## 2019-02-18 DIAGNOSIS — N1831 Chronic kidney disease, stage 3a: Secondary | ICD-10-CM

## 2019-02-18 MED ORDER — TOPIRAMATE 50 MG PO TABS: 50.0000 mg | ORAL_TABLET | Freq: Every evening | ORAL | 1 refills | Status: DC

## 2019-02-18 MED ORDER — AZELASTINE HCL 0.1 % NA SOLN: 2.0000 | Freq: Two times a day (BID) | NASAL | 2 refills | Status: DC

## 2019-02-18 MED ORDER — ALENDRONATE SODIUM 35 MG PO TABS: ORAL_TABLET | ORAL | 1 refills | Status: DC

## 2019-02-18 MED ORDER — FAMOTIDINE 40 MG PO TABS: 40.0000 mg | ORAL_TABLET | Freq: Every day | ORAL | 1 refills | Status: DC

## 2019-02-18 MED ORDER — IRBESARTAN 75 MG PO TABS: 75.0000 mg | ORAL_TABLET | Freq: Every day | ORAL | 1 refills | Status: DC

## 2019-02-18 NOTE — Progress Notes (Signed)
Name: Theresa Hall   MRN: [id number]    DOB: August 30, 1940   Date:02/18/2019       Progress Note  Subjective  Chief Complaint  Chief Complaint  Patient presents with  . Chronic Kidney Disease  . Dyslipidemia  . Hypertension  . Hyperlipidemia  . Hypothyroidism  . GI Problem    Has been lower GI problems x 1 month. She was having pain when she was having a bowel movements she had pain in her lower colon.    HPI  HTN and she has a history of CKI stage III: last labs showed improvement of GFR at 54  yearly.BPis ag goal on low dose of Avaprono chest pain or palpitation. Denies pruritus or change in urinary frequency . Last parathyroid level improved  Hypothyroidism: last TSH wasback to normal, she is taking 88 mcg daily and one and a half T and T andone daily other days of the week.No hair loss, palpitation or change in bowel movements  Rash: seen by Dermatologist last week, using a topical steroid, rash on lateral aspect of her feet, no pruritis.   Migraine headaches: she had it for many years, she is doing well on Topamax at night. No recent episodes. Now usually triggered by weather changes She needs refill of Topamax   Hyperlipidemia: taking medication and denies side effects,reviewed labs done January 2020  LDL was at goalat 60  Osteopenia: discussed results, discussed high calcium and vitamin D diet and shehas been taking low dose fosamax without side effects.Reviewed bone density done 10/2018 osteopenia stable withmild improvement Continue medication   Intermittent lower abdominal pain : she states she noticed a change of bowel movements with some lower abdominal cramping intermittently over the past month, no blood or mucus on stools. No change in appetite, nausea or vomiting. It happened after meals, not in the middle of the night. Soft stools. She states pain would improve once she had a bowel movement. She had last colonoscopy at [facility] . She has a history of  diverticulitis but did not feel as painful   GERD: she states symptoms are intermittent,she eats dinner around 7 pm and has occasional indigestion after she eats, but resolves by the time she goes to bed. She is now on Famotine, and is doing well  Patient Active Problem List   Diagnosis Date Noted  . BCC (basal cell carcinoma of skin) 10/16/2017  . Secondary hyperparathyroidism of renal origin ([place]) 07/18/2017  . Osteopenia 08/15/2016  . Closed displaced comminuted fracture of right patella 08/20/2015  . Reflux esophagitis 06/23/2014  . HLD (hyperlipidemia) 06/23/2014  . Palpitations 06/23/2014  . Chronic kidney disease (CKD), stage III (moderate) 08/24/2009  . History of diverticulitis 05/14/2008  . Adult hypothyroidism 12/04/2006  . Migraine without aura 08/02/2006  . 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 APPENDECTOMY    . BASAL CELL CARCINOMA EXCISION    . BASAL CELL CARCINOMA EXCISION  2014   nasal  . CHOLECYSTECTOMY    . COLONOSCOPY    . ORIF PATELLA Right 08/15/2015   Procedure: OPEN REDUCTION INTERNAL (ORIF) FIXATION PATELLA  and application wound vac;  Surgeon: Corky Mull, MD;  Location: [facility] ORS;  Service: Orthopedics;  Laterality: Right;  . TONSILLECTOMY    . VAGINAL HYSTERECTOMY      Family History  Problem Relation Age of Onset  . Heart attack Father        x2  . Heart  disease Sister   . Kidney disease Sister   . Diabetes Sister   . Heart attack Brother   . Diabetes Brother   . Heart attack Brother   . Breast cancer Neg Hx      Current Outpatient Medications:  .  alendronate (FOSAMAX) 35 MG tablet, TAKE 1 TABLET BY MOUTH EVERY 7 DAYS. TAKE WITH WATER ON AN EMPTY STOMACH., Disp: 12 tablet, Rfl: 1 .  ASPIRIN LOW DOSE 81 MG tablet, Take 1 tablet by mouth daily. , Disp: , Rfl:  .  azelastine (ASTELIN) 0.1 % nasal spray, Place 2 sprays into both nostrils 2 (two) times daily. Use in each nostril as  directed, Disp: 30 mL, Rfl: 2 .  Calcium Polycarbophil (FIBERCON PO), Take 1 tablet by mouth at bedtime. , Disp: , Rfl:  .  cholecalciferol (VITAMIN D) 1000 units tablet, Take 1 capsule by mouth daily., Disp: , Rfl:  .  clobetasol cream (TEMOVATE) 0.05 %, Apply 1 g topically daily as needed., Disp: , Rfl:  .  famotidine (PEPCID) 40 MG tablet, Take 1 tablet (40 mg total) by mouth daily., Disp: 90 tablet, Rfl: 1 .  Fish Oil-Cholecalciferol (FISH OIL + D3) 1200-1000 MG-UNIT CAPS, Take 1 capsule by mouth daily., Disp: , Rfl:  .  irbesartan (AVAPRO) 75 MG tablet, Take 1 tablet (75 mg total) by mouth daily., Disp: 90 tablet, Rfl: 1 .  latanoprost (XALATAN) 0.005 % ophthalmic solution, Place 1 drop into both eyes at bedtime., Disp: , Rfl: 5 .  Multiple Vitamin (MULTIVITAMIN) tablet, Take 1 tablet by mouth daily., Disp: , Rfl:  .  pravastatin (PRAVACHOL) 80 MG tablet, Take 1 tablet (80 mg total) by mouth daily., Disp: 90 tablet, Rfl: 3 .  SYNTHROID 88 MCG tablet, TAKE 1 TABLET BY MOUTH DAILY. TAKE 1 AND 1/2 TABLETS ON TUESDAYS AND THURSDAYS, Disp: 112 tablet, Rfl: 1 .  timolol (TIMOPTIC) 0.5 % ophthalmic solution, Place 1 drop into both eyes every morning., Disp: , Rfl:  .  topiramate (TOPAMAX) 50 MG tablet, Take 1 tablet (50 mg total) by mouth every evening., Disp: 90 tablet, Rfl: 1 .  vitamin C (ASCORBIC ACID) 500 MG tablet, Take 500 mg by mouth daily., Disp: , Rfl:   Allergies  Allergen Reactions  . Codeine Other (See Comments)    Patient had insomnia and nausea  . Oxycodone Other (See Comments)    Made her "Loopy"    I personally reviewed active problem list, medication list, allergies, family history, social history, health maintenance with the patient/caregiver today.   ROS  Constitutional: Negative for fever or weight change.  Respiratory: Negative for cough and shortness of breath.   Cardiovascular: Negative for chest pain or palpitations.  Gastrointestinal: Negative for abdominal  pain, change in bowel changes improved now   Musculoskeletal: Negative for gait problem or joint swelling.  Skin: Positive for rash.  Neurological: Negative for dizziness or headache.  No other specific complaints in a complete review of systems (except as listed in HPI above).  Objective  Vitals:   02/18/19 1041  BP: 120/66  Pulse: 79  Resp: 16  Temp: (!) 96.9 F (36.1 C)  TempSrc: Temporal  SpO2: 99%  Weight: 158 lb 4.8 oz (71.8 kg)  Height: 5\' 4"  (1.626 m)    Body mass index is 27.17 kg/m.  Physical Exam  Constitutional: Patient appears well-developed and well-nourished. Overweight. No distress.  HEENT: head atraumatic, normocephalic, pupils equal and reactive to light Cardiovascular: Normal rate, regular rhythm and  normal heart sounds.  No murmur heard. No BLE edema. Pulmonary/Chest: Effort normal and breath sounds normal. No respiratory distress. Abdominal: Soft.  There is no tenderness. Skin: erythematous rash on lateral aspect of both feet, blanches with pressure Psychiatric: Patient has a normal mood and affect. behavior is normal. Judgment and thought content normal.  PHQ2/9: Depression screen [facility] 2/9 02/18/2019 08/13/2018 07/20/2018 01/23/2018 07/18/2017  Decreased Interest 0 0 0 1 0  Down, Depressed, Hopeless 0 0 0 0 0  PHQ - 2 Score 0 0 0 1 0  Altered sleeping 0 0 - 0 0  Tired, decreased energy 0 0 - 3 0  Change in appetite 0 0 - 2 0  Feeling bad or failure about yourself  0 0 - 0 0  Trouble concentrating 0 0 - 1 0  Moving slowly or fidgety/restless 0 0 - 0 0  Suicidal thoughts 0 0 - 0 0  PHQ-9 Score 0 0 - 7 0  Difficult doing work/chores - - - Somewhat difficult Not difficult at all    phq 9 is negative   Fall Risk: Fall Risk  02/18/2019 08/13/2018 07/20/2018 01/23/2018 07/18/2017  Falls in the past year? 0 0 0 0 Yes  Comment - - - - slipped in puddle of water. Admitted to SNF for rehab  Number falls in past yr: 0 0 0 - 1  Injury with Fall? 0 0 0 - Yes  Comment  - - - - -  Risk for fall due to : - - - - Other (Comment);Impaired vision  Risk for fall due to: Comment - - - - knee pain, dyspnea; wears eyeglasses; glaucoma and B cataracts  Follow up - - Falls prevention discussed - Falls evaluation completed;Education provided;Falls prevention discussed     Functional Status Survey: Is the patient deaf or have difficulty hearing?: No Does the patient have difficulty seeing, even when wearing glasses/contacts?: No Does the patient have difficulty concentrating, remembering, or making decisions?: No Does the patient have difficulty walking or climbing stairs?: No Does the patient have difficulty dressing or bathing?: No Does the patient have difficulty doing errands alone such as visiting a doctor's office or shopping?: No    Assessment & Plan  1. Migraine without aura and without status migrainosus, not intractable  - topiramate (TOPAMAX) 50 MG tablet; Take 1 tablet (50 mg total) by mouth every evening.  Dispense: 90 tablet; Refill: 1  2. Dyslipidemia  - Lipid panel  3. Hypertension, benign  - irbesartan (AVAPRO) 75 MG tablet; Take 1 tablet (75 mg total) by mouth daily.  Dispense: 90 tablet; Refill: 1 - COMPLETE METABOLIC PANEL WITH GFR - CBC with Differential/Platelet  4. Stage 3a chronic kidney disease  - irbesartan (AVAPRO) 75 MG tablet; Take 1 tablet (75 mg total) by mouth daily.  Dispense: 90 tablet; Refill: 1  5. GERD without esophagitis  - famotidine (PEPCID) 40 MG tablet; Take 1 tablet (40 mg total) by mouth daily.  Dispense: 90 tablet; Refill: 1  6. Osteopenia, unspecified location  - alendronate (FOSAMAX) 35 MG tablet; TAKE 1 TABLET BY MOUTH EVERY 7 DAYS. TAKE WITH WATER ON AN EMPTY STOMACH.  Dispense: 12 tablet; Refill: 1  7. Adult hypothyroidism  - TSH  8. Secondary hyperparathyroidism of renal origin ([place])   9. Perennial allergic rhinitis with seasonal variation  - azelastine (ASTELIN) 0.1 % nasal spray; Place 2  sprays into both nostrils 2 (two) times daily. Use in each nostril as directed  Dispense: 30  mL; Refill: 2  10. Dermatitis  Continue medication   11. Lower abdominal pain  Resolved now, we will check CBC and comp panel, discussed stool samples but she just received a reminder follow up to see gi and she will call them

## 2019-02-19 LAB — CBC WITH DIFFERENTIAL/PLATELET
Absolute Monocytes: 536 cells/uL (ref 200–950)
Basophils Absolute: 61 cells/uL (ref 0–200)
Basophils Relative: 1.2 %
Eosinophils Absolute: 112 cells/uL (ref 15–500)
Eosinophils Relative: 2.2 %
HCT: 37.7 % (ref 35.0–45.0)
Hemoglobin: 12.9 g/dL (ref 11.7–15.5)
Lymphs Abs: 2111 cells/uL (ref 850–3900)
MCH: 31.3 pg (ref 27.0–33.0)
MCHC: 34.2 g/dL (ref 32.0–36.0)
MCV: 91.5 fL (ref 80.0–100.0)
MPV: 11.1 fL (ref 7.5–12.5)
Monocytes Relative: 10.5 %
Neutro Abs: 2280 cells/uL (ref 1500–7800)
Neutrophils Relative %: 44.7 %
Platelets: 211 10*3/uL (ref 140–400)
RBC: 4.12 10*6/uL (ref 3.80–5.10)
RDW: 12.1 % (ref 11.0–15.0)
Total Lymphocyte: 41.4 %
WBC: 5.1 10*3/uL (ref 3.8–10.8)

## 2019-02-19 LAB — COMPLETE METABOLIC PANEL WITH GFR
AG Ratio: 1.7 (calc) (ref 1.0–2.5)
ALT: 25 U/L (ref 6–29)
AST: 18 U/L (ref 10–35)
Albumin: 4.5 g/dL (ref 3.6–5.1)
Alkaline phosphatase (APISO): 45 U/L (ref 37–153)
BUN/Creatinine Ratio: 18 (calc) (ref 6–22)
BUN: 18 mg/dL (ref 7–25)
CO2: 27 mmol/L (ref 20–32)
Calcium: 10.1 mg/dL (ref 8.6–10.4)
Chloride: 110 mmol/L (ref 98–110)
Creat: 1 mg/dL — ABNORMAL HIGH (ref 0.60–0.88)
GFR, Est African American: 62 mL/min/{1.73_m2} (ref >=60)
GFR, Est Non African American: 53 mL/min/{1.73_m2} — ABNORMAL LOW (ref >=60)
Globulin: 2.6 g/dL (calc) (ref 1.9–3.7)
Glucose, Bld: 102 mg/dL — ABNORMAL HIGH (ref 65–99)
Potassium: 4.9 mmol/L (ref 3.5–5.3)
Sodium: 143 mmol/L (ref 135–146)
Total Bilirubin: 0.4 mg/dL (ref 0.2–1.2)
Total Protein: 7.1 g/dL (ref 6.1–8.1)

## 2019-02-19 LAB — LIPID PANEL
Cholesterol: 170 mg/dL (ref <200)
HDL: 52 mg/dL (ref >=50)
LDL Cholesterol (Calc): 88 mg/dL (calc)
Non-HDL Cholesterol (Calc): 118 mg/dL (calc) (ref <130)
Total CHOL/HDL Ratio: 3.3 (calc) (ref <5.0)
Triglycerides: 201 mg/dL — ABNORMAL HIGH (ref <150)

## 2019-02-19 LAB — HEMOGLOBIN A1C
Hgb A1c MFr Bld: 5.1 % of total Hgb (ref <5.7)
Mean Plasma Glucose: 100 (calc)
eAG (mmol/L): 5.5 (calc)

## 2019-02-19 LAB — TSH: TSH: 0.08 mIU/L — ABNORMAL LOW (ref 0.40–4.50)

## 2019-02-22 NOTE — Addendum Note (Signed)
Addended by: Inda Coke on: 02/22/2019 04:40 PM   Modules accepted: Orders

## 2019-03-04 DIAGNOSIS — D235 Other benign neoplasm of skin of trunk: Secondary | ICD-10-CM | POA: Diagnosis not present

## 2019-03-04 DIAGNOSIS — D225 Melanocytic nevi of trunk: Secondary | ICD-10-CM | POA: Diagnosis not present

## 2019-03-09 DIAGNOSIS — K5900 Constipation, unspecified: Secondary | ICD-10-CM | POA: Diagnosis not present

## 2019-03-09 DIAGNOSIS — H409 Unspecified glaucoma: Secondary | ICD-10-CM | POA: Diagnosis not present

## 2019-03-09 DIAGNOSIS — N1831 Chronic kidney disease, stage 3a: Secondary | ICD-10-CM | POA: Diagnosis not present

## 2019-03-09 DIAGNOSIS — J309 Allergic rhinitis, unspecified: Secondary | ICD-10-CM | POA: Diagnosis not present

## 2019-03-09 DIAGNOSIS — M810 Age-related osteoporosis without current pathological fracture: Secondary | ICD-10-CM | POA: Diagnosis not present

## 2019-03-09 DIAGNOSIS — G43909 Migraine, unspecified, not intractable, without status migrainosus: Secondary | ICD-10-CM | POA: Diagnosis not present

## 2019-03-09 DIAGNOSIS — K219 Gastro-esophageal reflux disease without esophagitis: Secondary | ICD-10-CM | POA: Diagnosis not present

## 2019-03-09 DIAGNOSIS — I129 Hypertensive chronic kidney disease with stage 1 through stage 4 chronic kidney disease, or unspecified chronic kidney disease: Secondary | ICD-10-CM | POA: Diagnosis not present

## 2019-03-09 DIAGNOSIS — E785 Hyperlipidemia, unspecified: Secondary | ICD-10-CM | POA: Diagnosis not present

## 2019-03-09 DIAGNOSIS — Z008 Encounter for other general examination: Secondary | ICD-10-CM | POA: Diagnosis not present

## 2019-03-09 DIAGNOSIS — E039 Hypothyroidism, unspecified: Secondary | ICD-10-CM | POA: Diagnosis not present

## 2019-03-18 DIAGNOSIS — C44712 Basal cell carcinoma of skin of right lower limb, including hip: Secondary | ICD-10-CM | POA: Diagnosis not present

## 2019-03-25 ENCOUNTER — Encounter: Payer: Self-pay | Admitting: Internal Medicine

## 2019-03-25 ENCOUNTER — Ambulatory Visit
Admission: RE | Admit: 2019-03-25 | Discharge: 2019-03-25 | Disposition: A | Discharge status: Home / Self Care | Payer: Medicare HMO | Source: Ambulatory Visit | Attending: Internal Medicine | Admitting: Internal Medicine

## 2019-03-25 ENCOUNTER — Ambulatory Visit (INDEPENDENT_AMBULATORY_CARE_PROVIDER_SITE_OTHER): Payer: Medicare HMO | Admitting: Internal Medicine

## 2019-03-25 ENCOUNTER — Ambulatory Visit
Admission: RE | Admit: 2019-03-25 | Discharge: 2019-03-25 | Disposition: A | Discharge status: Home / Self Care | Payer: Medicare HMO | Attending: Internal Medicine | Admitting: Internal Medicine

## 2019-03-25 ENCOUNTER — Other Ambulatory Visit: Payer: Self-pay

## 2019-03-25 VITALS — HR 84 | Temp 97.5°F | Resp 20 | Ht 64.0 in | Wt 155.0 lb

## 2019-03-25 DIAGNOSIS — Z8719 Personal history of other diseases of the digestive system: Secondary | ICD-10-CM | POA: Insufficient documentation

## 2019-03-25 DIAGNOSIS — R1030 Lower abdominal pain, unspecified: Secondary | ICD-10-CM

## 2019-03-25 DIAGNOSIS — K5900 Constipation, unspecified: Secondary | ICD-10-CM | POA: Diagnosis not present

## 2019-03-25 DIAGNOSIS — R195 Other fecal abnormalities: Secondary | ICD-10-CM

## 2019-03-25 DIAGNOSIS — R110 Nausea: Secondary | ICD-10-CM | POA: Insufficient documentation

## 2019-03-25 DIAGNOSIS — E039 Hypothyroidism, unspecified: Secondary | ICD-10-CM | POA: Diagnosis not present

## 2019-03-25 DIAGNOSIS — R109 Unspecified abdominal pain: Secondary | ICD-10-CM | POA: Diagnosis not present

## 2019-03-25 NOTE — Patient Instructions (Signed)
Stay well hydrated  Please obtain x-ray today  Keep planned follow-up with GI doctors next week as planned

## 2019-03-25 NOTE — Progress Notes (Signed)
Patient ID: Theresa Hall, female    DOB: 1940-05-19, 79 y.o.   MRN: [id number]  PCP: Steele Sizer, MD  Chief Complaint  Patient presents with  . Diarrhea    onset a couple of months, off and on of colon and abdominal pain, once she has a BM the pain goes away  . Nausea    yesterday she had pain and became very sick to her stomach 2-3 times    Subjective:   Theresa Hall is a 79 y.o. female, presents to clinic with CC of the following:  Chief Complaint  Patient presents with  . Diarrhea    onset a couple of months, off and on of colon and abdominal pain, once she has a BM the pain goes away  . Nausea    yesterday she had pain and became very sick to her stomach 2-3 times    HPI:  Patient is an 79 year old female patient of Dr. Ancil Boozer with history of hypertension, CKD, hyperlipidemia, hypothyroidism, and migraine headaches who was last seen 02/17/2018. Some recent lower intermittent lower abdominal pain was noted at that visit as follows:   she states she noticed a change of bowel movements with some lower abdominal cramping intermittently over the past month, no blood or mucus on stools. No change in appetite, nausea or vomiting. It happened after meals, not in the middle of the night. Soft stools. She states pain would improve once she had a bowel movement. She had last colonoscopy at [facility] .  Her white count and liver tests were normal and stool samples entertained, but held on getting as it was noted she had a reminder to follow-up with GI and would do so.  She has a history of diverticulitis but did not feel as painful.   Also with a h/o GERD: symptoms are intermittent, has occasional indigestion after she eats, but usually resolves by the time she goes to bed. On Famotine, and does well with that.   She follows up today with abdominal pain and loose bowel movements, notes it has happened in the past couple months. Has intermittent abdominal pains, across the lower  abdomen more than one-sided, that often are relieved after she has a bowel movement.  Bowel movements are intermittently loose, often soft, and at most happens 3-4 times a day.  No blood work black dark stools.  No vomiting, but had nausea yesterday when she had more of this pain and almost vomited.  She states she is never constipated and very regular.  At one time last week, she did take an Imodium product, only 1 dose, but is not taking anything for these otherwise. She has a history of diverticulitis, her last episode at least 5+ years ago per her reports, and tries to watch her diet, avoiding nuts, strawberries, although she does love mandrin oranges, and notes that can be an exacerbation.  Has tried to lessen that recently, and has not had this in over a week, and symptoms have persisted.  She notes the pain with her prior diverticulitis was quite severe, like "shards of glass "across the lower abdomen.  She has not had pain that severe to date. She denies any urinary symptoms, with no burning when she urinates, no marked frequency, and no blood with the urine. She denies any antibiotics during the last couple months or before this started, and also no recent travel. No fevers, increased cough, or other symptoms of concern for Covid in the  recent past.  Her TSH was low on last check, and she has adjusted her dose to lessen her supplement. She has a follow-up with gastroenterology scheduled for next Thursday, with consultation prior to obtaining a colonoscopy as the reason.  Patient Active Problem List   Diagnosis Date Noted  . BCC (basal cell carcinoma of skin) 10/16/2017  . Secondary hyperparathyroidism of renal origin ([place]) 07/18/2017  . Osteopenia 08/15/2016  . Closed displaced comminuted fracture of right patella 08/20/2015  . Reflux esophagitis 06/23/2014  . HLD (hyperlipidemia) 06/23/2014  . Palpitations 06/23/2014  . Chronic kidney disease (CKD), stage III (moderate) 08/24/2009  .  History of diverticulitis 05/14/2008  . Adult hypothyroidism 12/04/2006  . Migraine without aura 08/02/2006  . 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  alendronate (FOSAMAX) 35 MG tablet, TAKE 1 TABLET BY MOUTH EVERY 7 DAYS. TAKE WITH WATER ON AN EMPTY STOMACH., Disp: 12 tablet, Rfl: 1 .  ASPIRIN LOW DOSE 81 MG tablet, Take 1 tablet by mouth daily. , Disp: , Rfl:  .  azelastine (ASTELIN) 0.1 % nasal spray, Place 2 sprays into both nostrils 2 (two) times daily. Use in each nostril as directed, Disp: 30 mL, Rfl: 2 .  Calcium Polycarbophil (FIBERCON PO), Take 1 tablet by mouth at bedtime. , Disp: , Rfl:  .  cholecalciferol (VITAMIN D) 1000 units tablet, Take 1 capsule by mouth daily., Disp: , Rfl:  .  famotidine (PEPCID) 40 MG tablet, Take 1 tablet (40 mg total) by mouth daily., Disp: 90 tablet, Rfl: 1 .  Fish Oil-Cholecalciferol (FISH OIL + D3) 1200-1000 MG-UNIT CAPS, Take 1 capsule by mouth daily., Disp: , Rfl:  .  irbesartan (AVAPRO) 75 MG tablet, Take 1 tablet (75 mg total) by mouth daily., Disp: 90 tablet, Rfl: 1 .  latanoprost (XALATAN) 0.005 % ophthalmic solution, Place 1 drop into both eyes at bedtime., Disp: , Rfl: 5 .  Multiple Vitamin (MULTIVITAMIN) tablet, Take 1 tablet by mouth daily., Disp: , Rfl:  .  pravastatin (PRAVACHOL) 80 MG tablet, Take 1 tablet (80 mg total) by mouth daily., Disp: 90 tablet, Rfl: 3 .  SYNTHROID 88 MCG tablet, TAKE 1 TABLET BY MOUTH DAILY. TAKE 1 AND 1/2 TABLETS ON TUESDAYS AND THURSDAYS, Disp: 112 tablet, Rfl: 1 .  timolol (TIMOPTIC) 0.5 % ophthalmic solution, Place 1 drop into both eyes every morning., Disp: , Rfl:  .  topiramate (TOPAMAX) 50 MG tablet, Take 1 tablet (50 mg total) by mouth every evening., Disp: 90 tablet, Rfl: 1 .  vitamin C (ASCORBIC ACID) 500 MG tablet, Take 500 mg by mouth daily., Disp: , Rfl:  .  clobetasol cream (TEMOVATE) 0.05 %, Apply 1 g topically daily as needed., Disp: , Rfl:     Allergies  Allergen Reactions  . Codeine Other (See Comments)    Patient had insomnia and nausea  . Oxycodone Other (See Comments)    Made her "Loopy"     Past Surgical History:  Procedure Laterality Date  . APPENDECTOMY    . BASAL CELL CARCINOMA EXCISION    . BASAL CELL CARCINOMA EXCISION  2014   nasal  . CHOLECYSTECTOMY    . COLONOSCOPY    . ORIF PATELLA Right 08/15/2015   Procedure: OPEN REDUCTION INTERNAL (ORIF) FIXATION PATELLA  and application wound vac;  Surgeon: Corky Mull, MD;  Location: [facility] ORS;  Service: Orthopedics;  Laterality: Right;  . TONSILLECTOMY    . VAGINAL HYSTERECTOMY  Family History  Problem Relation Age of Onset  . Heart attack Father        x2  . Heart disease Sister   . Kidney disease Sister   . Diabetes Sister   . Heart attack Brother   . Diabetes Brother   . Heart attack Brother   . Breast cancer Neg Hx      Social History   Tobacco Use  . Smoking status: Never Smoker  . Smokeless tobacco: Never Used  . Tobacco comment: smoking cessation materials not required  Substance Use Topics  . Alcohol use: No    With staff assistance, above reviewed with the patient today.  ROS: As per HPI, otherwise no specific complaints on a limited and focused system review   No results found for this or any previous visit (from the past 72 hour(s)).   PHQ2/9: Depression screen [facility] 2/9 03/25/2019 02/18/2019 08/13/2018 07/20/2018 01/23/2018  Decreased Interest 0 0 0 0 1  Down, Depressed, Hopeless 0 0 0 0 0  PHQ - 2 Score 0 0 0 0 1  Altered sleeping 0 0 0 - 0  Tired, decreased energy 1 0 0 - 3  Change in appetite 0 0 0 - 2  Feeling bad or failure about yourself  0 0 0 - 0  Trouble concentrating 0 0 0 - 1  Moving slowly or fidgety/restless 0 0 0 - 0  Suicidal thoughts 0 0 0 - 0  PHQ-9 Score 1 0 0 - 7  Difficult doing work/chores Not difficult at all - - - Somewhat difficult   PHQ-2/9 Result is neg  Fall Risk: Fall Risk  03/25/2019  02/18/2019 08/13/2018 07/20/2018 01/23/2018  Falls in the past year? 0 0 0 0 0  Comment - - - - -  Number falls in past yr: 0 0 0 0 -  Injury with Fall? 0 0 0 0 -  Comment - - - - -  Risk for fall due to : - - - - -  Risk for fall due to: Comment - - - - -  Follow up - - - Falls prevention discussed -      Objective:   Vitals:   03/25/19 1131  Pulse: 84  Resp: 20  Temp: (!) 97.5 F (36.4 C)  TempSrc: Temporal  SpO2: 99%  Weight: 155 lb (70.3 kg)  Height: 5\' 4"  (1.626 m)    Body mass index is 26.61 kg/m.  Physical Exam   NAD, masked HEENT - [place]/AT, sclera anicteric, PERRL, EOMI, conj - non-inj'ed, pharynx clear Neck - supple, no adenopathy, no TM,  Car - RRR without m/g/r Pulm- RR and effort normal at rest, CTA without wheeze or rales Abd - soft, noted a very mild discomfort when palpating for the liver edge and spleen tip in the upper quadrants, not marked, and was nontender diffusely over the abdomen and suprapubic regions bilaterally, with no rebound or guarding, ND, BS+,  no masses, no hepatosplenomegaly. Back - no CVA tenderness Skin- no rash noted on exposed areas,  Ext - no LE edema,  Neuro/psychiatric - affect was not flat, appropriate with conversation  Alert and oriented  Speech and gait are normal   Results for orders placed or performed in visit on 02/18/19  Lipid panel  Result Value Ref Range   Cholesterol 170 <200 mg/dL   HDL 52 > OR = 50 mg/dL   Triglycerides 201 (H) <150 mg/dL   LDL Cholesterol (Calc) 88 mg/dL (calc)  Total CHOL/HDL Ratio 3.3 <5.0 (calc)   Non-HDL Cholesterol (Calc) 118 <130 mg/dL (calc)  COMPLETE METABOLIC PANEL WITH GFR  Result Value Ref Range   Glucose, Bld 102 (H) 65 - 99 mg/dL   BUN 18 7 - 25 mg/dL   Creat 1.00 (H) 0.60 - 0.88 mg/dL   GFR, Est Non African American 53 (L) > OR = 60 mL/min/1.62m2   GFR, Est African American 62 > OR = 60 mL/min/1.57m2   BUN/Creatinine Ratio 18 6 - 22 (calc)   Sodium 143 135 - 146 mmol/L    Potassium 4.9 3.5 - 5.3 mmol/L   Chloride 110 98 - 110 mmol/L   CO2 27 20 - 32 mmol/L   Calcium 10.1 8.6 - 10.4 mg/dL   Total Protein 7.1 6.1 - 8.1 g/dL   Albumin 4.5 3.6 - 5.1 g/dL   Globulin 2.6 1.9 - 3.7 g/dL (calc)   AG Ratio 1.7 1.0 - 2.5 (calc)   Total Bilirubin 0.4 0.2 - 1.2 mg/dL   Alkaline phosphatase (APISO) 45 37 - 153 U/L   AST 18 10 - 35 U/L   ALT 25 6 - 29 U/L  CBC with Differential/Platelet  Result Value Ref Range   WBC 5.1 3.8 - 10.8 Thousand/uL   RBC 4.12 3.80 - 5.10 Million/uL   Hemoglobin 12.9 11.7 - 15.5 g/dL   HCT 37.7 35.0 - 45.0 %   MCV 91.5 80.0 - 100.0 fL   MCH 31.3 27.0 - 33.0 pg   MCHC 34.2 32.0 - 36.0 g/dL   RDW 12.1 11.0 - 15.0 %   Platelets 211 140 - 400 Thousand/uL   MPV 11.1 7.5 - 12.5 fL   Neutro Abs 2,280 1,500 - 7,800 cells/uL   Lymphs Abs 2,111 850 - 3,900 cells/uL   Absolute Monocytes 536 200 - 950 cells/uL   Eosinophils Absolute 112 15 - 500 cells/uL   Basophils Absolute 61 0 - 200 cells/uL   Neutrophils Relative % 44.7 %   Total Lymphocyte 41.4 %   Monocytes Relative 10.5 %   Eosinophils Relative 2.2 %   Basophils Relative 1.2 %  TSH  Result Value Ref Range   TSH 0.08 (L) 0.40 - 4.50 mIU/L  Hemoglobin A1c  Result Value Ref Range   Hgb A1c MFr Bld 5.1 <5.7 % of total Hgb   Mean Plasma Glucose 100 (calc)   eAG (mmol/L) 5.5 (calc)       Assessment & Plan:   1. Lower abdominal pain She notes the pain across the lower abdomen/suprapubic region, not left-sided as often can be with diverticulitis.  She notes when she had this years ago, it seemed to be across the lower abdomen at that time.  Discussed many things in the differential here, and will check some labs, including a white count and a BMP, will get an imaging study mainly to rule out any major obstruction concern, as discussed the CT scan is often the best when assessing for diverticulitis.  Also will get a stool panel for infection concerns.  We will check a urinalysis as  well. Pending those results, discussed potential empiric antibiotics and the role, will wait for the lab tests, before adding an empiric antibiotic regimen presently. Emphasized staying well-hydrated.  Also avoiding foods as she is doing consistent with a diverticular disease diet. - BASIC METABOLIC PANEL WITH GFR - CBC with Differential/Platelet - Gastrointestinal Panel by PCR , Stool - DG Abd 1 View; Future - Urinalysis, Complete  2. Loose stools  As above, await tests ordered.  No recent foreign travel or antibiotics to raise concerns for C. difficile or ova and parasitic concerns. - BASIC METABOLIC PANEL WITH GFR - CBC with Differential/Platelet - Gastrointestinal Panel by PCR , Stool - DG Abd 1 View; Future  3. H/O diverticulitis of colon Has been many years, although certainly diverticulitis is in the differential.  As above noted, await lab studies and imaging before rushing to empiric antibiotic regimen. - BASIC METABOLIC PANEL WITH GFR - CBC with Differential/Platelet - DG Abd 1 View; Future  4. Nausea As above, did not have vomiting with the nausea yesterday.  Is better today. - DG Abd 1 View; Future  5. Adult hypothyroidism Has reduced her thyroid supplement some after the last TSH being slightly low.  A little soon to check her TSH status at this point.  Doubt that is the source of her above symptoms.  Emphasized keeping a follow-up with gastroenterology next Thursday, and welcome their input.  Colonoscopy also might be helpful pending the testing results and her clinical status.  Await the tests ordered above presently.       Towanda Malkin, MD 03/25/19 11:47 AM

## 2019-03-26 LAB — CBC WITH DIFFERENTIAL/PLATELET
Absolute Monocytes: 425 cells/uL (ref 200–950)
Basophils Absolute: 60 cells/uL (ref 0–200)
Basophils Relative: 1.2 %
Eosinophils Absolute: 140 cells/uL (ref 15–500)
Eosinophils Relative: 2.8 %
HCT: 39.1 % (ref 35.0–45.0)
Hemoglobin: 13.2 g/dL (ref 11.7–15.5)
Lymphs Abs: 1875 cells/uL (ref 850–3900)
MCH: 31.1 pg (ref 27.0–33.0)
MCHC: 33.8 g/dL (ref 32.0–36.0)
MCV: 92.2 fL (ref 80.0–100.0)
MPV: 11 fL (ref 7.5–12.5)
Monocytes Relative: 8.5 %
Neutro Abs: 2500 cells/uL (ref 1500–7800)
Neutrophils Relative %: 50 %
Platelets: 221 10*3/uL (ref 140–400)
RBC: 4.24 10*6/uL (ref 3.80–5.10)
RDW: 12.2 % (ref 11.0–15.0)
Total Lymphocyte: 37.5 %
WBC: 5 10*3/uL (ref 3.8–10.8)

## 2019-03-26 LAB — BASIC METABOLIC PANEL WITH GFR
BUN/Creatinine Ratio: 15 (calc) (ref 6–22)
BUN: 16 mg/dL (ref 7–25)
CO2: 25 mmol/L (ref 20–32)
Calcium: 9.9 mg/dL (ref 8.6–10.4)
Chloride: 107 mmol/L (ref 98–110)
Creat: 1.06 mg/dL — ABNORMAL HIGH (ref 0.60–0.88)
GFR, Est African American: 57 mL/min/{1.73_m2} — ABNORMAL LOW (ref >=60)
GFR, Est Non African American: 50 mL/min/{1.73_m2} — ABNORMAL LOW (ref >=60)
Glucose, Bld: 78 mg/dL (ref 65–99)
Potassium: 4.5 mmol/L (ref 3.5–5.3)
Sodium: 140 mmol/L (ref 135–146)

## 2019-03-26 LAB — URINALYSIS, COMPLETE
Bacteria, UA: NONE SEEN /HPF
Bilirubin Urine: NEGATIVE
Glucose, UA: NEGATIVE
Hgb urine dipstick: NEGATIVE
Hyaline Cast: NONE SEEN /LPF
Leukocytes,Ua: NEGATIVE
Nitrite: NEGATIVE
Protein, ur: NEGATIVE
RBC / HPF: NONE SEEN /HPF (ref 0–2)
Specific Gravity, Urine: 1.02 (ref 1.001–1.03)
Squamous Epithelial / HPF: NONE SEEN /HPF (ref <=5)
WBC, UA: NONE SEEN /HPF (ref 0–5)
pH: <=5 (ref 5.0–8.0)

## 2019-04-03 ENCOUNTER — Encounter: Payer: Self-pay | Admitting: Certified Nurse Midwife

## 2019-04-04 ENCOUNTER — Other Ambulatory Visit: Payer: Self-pay | Admitting: Student

## 2019-04-04 DIAGNOSIS — K5900 Constipation, unspecified: Secondary | ICD-10-CM | POA: Diagnosis not present

## 2019-04-04 DIAGNOSIS — Z01812 Encounter for preprocedural laboratory examination: Secondary | ICD-10-CM | POA: Diagnosis not present

## 2019-04-04 DIAGNOSIS — R1031 Right lower quadrant pain: Secondary | ICD-10-CM | POA: Diagnosis not present

## 2019-04-04 DIAGNOSIS — R194 Change in bowel habit: Secondary | ICD-10-CM

## 2019-04-04 DIAGNOSIS — R1032 Left lower quadrant pain: Secondary | ICD-10-CM | POA: Diagnosis not present

## 2019-04-17 ENCOUNTER — Ambulatory Visit
Admission: RE | Admit: 2019-04-17 | Discharge: 2019-04-17 | Disposition: A | Discharge status: Home / Self Care | Payer: Medicare HMO | Source: Ambulatory Visit | Attending: Student | Admitting: Student

## 2019-04-17 ENCOUNTER — Other Ambulatory Visit: Payer: Self-pay

## 2019-04-17 DIAGNOSIS — R1032 Left lower quadrant pain: Secondary | ICD-10-CM | POA: Insufficient documentation

## 2019-04-17 DIAGNOSIS — R109 Unspecified abdominal pain: Secondary | ICD-10-CM | POA: Diagnosis not present

## 2019-04-17 DIAGNOSIS — K5900 Constipation, unspecified: Secondary | ICD-10-CM | POA: Diagnosis present

## 2019-04-17 DIAGNOSIS — R194 Change in bowel habit: Secondary | ICD-10-CM | POA: Insufficient documentation

## 2019-04-17 DIAGNOSIS — R1031 Right lower quadrant pain: Secondary | ICD-10-CM | POA: Diagnosis present

## 2019-04-17 MED ORDER — IOHEXOL 300 MG/ML  SOLN
100.0000 mL | Freq: Once | INTRAMUSCULAR | Status: AC | PRN
  Administered 2019-04-17: 100 mL via INTRAVENOUS

## 2019-05-15 ENCOUNTER — Other Ambulatory Visit: Payer: Self-pay | Admitting: Family Medicine

## 2019-05-15 DIAGNOSIS — J302 Other seasonal allergic rhinitis: Secondary | ICD-10-CM

## 2019-05-15 DIAGNOSIS — J3089 Other allergic rhinitis: Secondary | ICD-10-CM

## 2019-05-15 NOTE — Telephone Encounter (Signed)
Requested Prescriptions  Pending Prescriptions Disp Refills  . azelastine (ASTELIN) 0.1 % nasal spray [Pharmacy Med Name: AZELASTINE 0.1% (137 MCG) SPRY] 30 mL     Sig: PLACE 2 SPRAYS INTO BOTH NOSTRILS 2 (TWO) TIMES DAILY. USE IN EACH NOSTRIL AS DIRECTED     Ear, Nose, and Throat: Nasal Preparations - Antiallergy Passed - 05/15/2019  2:01 AM      Passed - Valid encounter within last 12 months    Recent Outpatient Visits          1 month ago Lower abdominal pain   [place], [place]   2 months ago Secondary hyperparathyroidism of renal origin [facility])   [facility] Steele Sizer, MD   9 months ago Dyslipidemia   [facility] Steele Sizer, MD   1 year ago Chronic kidney disease, stage III (moderate) [facility])   [facility] Steele Sizer, MD   1 year ago Chronic kidney disease, stage III (moderate) [facility])   [facility] Steele Sizer, MD      Future Appointments            In 2 months  [facility], [place]   In 3 months Steele Sizer, MD [facility] [place]

## 2019-05-24 DIAGNOSIS — H401131 Primary open-angle glaucoma, bilateral, mild stage: Secondary | ICD-10-CM | POA: Diagnosis not present

## 2019-05-31 ENCOUNTER — Other Ambulatory Visit: Payer: Self-pay | Admitting: Family Medicine

## 2019-06-13 DIAGNOSIS — J309 Allergic rhinitis, unspecified: Secondary | ICD-10-CM | POA: Diagnosis not present

## 2019-06-13 DIAGNOSIS — J069 Acute upper respiratory infection, unspecified: Secondary | ICD-10-CM | POA: Diagnosis not present

## 2019-06-13 DIAGNOSIS — R52 Pain, unspecified: Secondary | ICD-10-CM | POA: Diagnosis not present

## 2019-06-13 DIAGNOSIS — Z03818 Encounter for observation for suspected exposure to other biological agents ruled out: Secondary | ICD-10-CM | POA: Diagnosis not present

## 2019-06-18 DIAGNOSIS — K219 Gastro-esophageal reflux disease without esophagitis: Secondary | ICD-10-CM | POA: Diagnosis not present

## 2019-06-18 DIAGNOSIS — H2512 Age-related nuclear cataract, left eye: Secondary | ICD-10-CM | POA: Diagnosis not present

## 2019-06-24 ENCOUNTER — Other Ambulatory Visit: Payer: Self-pay

## 2019-06-24 ENCOUNTER — Encounter: Payer: Self-pay | Admitting: Ophthalmology

## 2019-06-25 ENCOUNTER — Telehealth: Payer: Self-pay | Admitting: Family Medicine

## 2019-06-25 NOTE — Chronic Care Management (AMB) (Signed)
  Chronic Care Management   Note  06/25/2019 Name: Theresa Hall MRN: [id number] DOB: [birth year]  Theresa Hall is a 79 y.o. year old female who is a primary care patient of Steele Sizer, MD. I reached out to Dwaine Gale by phone today in response to a referral sent by [name]'s health plan.     [name] was given information about Chronic Care Management services today including:  1. CCM service includes personalized support from designated clinical staff supervised by her physician, including individualized plan of care and coordination with other care providers 2. 24/7 contact phone numbers for assistance for urgent and routine care needs. 3. Service will only be billed when office clinical staff spend 20 minutes or more in a month to coordinate care. 4. Only one practitioner may furnish and bill the service in a calendar month. 5. The patient may stop CCM services at any time (effective at the end of the month) by phone call to the office staff. 6. The patient will be responsible for cost sharing (co-pay) of up to 20% of the service fee (after annual deductible is met).  Patient agreed to services and verbal consent obtained.   Follow up plan: Telephone appointment with care management team member scheduled for:08/05/2019  [place] [place] [facility]  [place], [place] [address] Direct Dial: [email or web address] Website: [email or web address]

## 2019-06-30 ENCOUNTER — Other Ambulatory Visit: Payer: Self-pay | Admitting: Family Medicine

## 2019-06-30 DIAGNOSIS — E785 Hyperlipidemia, unspecified: Secondary | ICD-10-CM

## 2019-06-30 NOTE — Telephone Encounter (Signed)
Requested Prescriptions  Pending Prescriptions Disp Refills   pravastatin (PRAVACHOL) 80 MG tablet [Pharmacy Med Name: PRAVASTATIN SODIUM 80 MG TAB] 90 tablet 3    Sig: TAKE 1 TABLET BY MOUTH EVERY DAY     Cardiovascular:  Antilipid - Statins Failed - 06/30/2019  9:29 AM      Failed - Triglycerides in normal range and within 360 days    Triglycerides  Date Value Ref Range Status  02/18/2019 201 (H) <150 mg/dL Final    Comment:    . If a non-fasting specimen was collected, consider repeat triglyceride testing on a fasting specimen if clinically indicated.  Yates Decamp et al. J. of Clin. Lipidol. 3888;2:800-349. Marland Kitchen          Passed - Total Cholesterol in normal range and within 360 days    Cholesterol, Total  Date Value Ref Range Status  01/08/2015 162 100 - 199 mg/dL Final   Cholesterol  Date Value Ref Range Status  02/18/2019 170 <200 mg/dL Final         Passed - LDL in normal range and within 360 days    LDL Cholesterol (Calc)  Date Value Ref Range Status  02/18/2019 88 mg/dL (calc) Final    Comment:    Reference range: <100 . Desirable range <100 mg/dL for primary prevention;   <70 mg/dL for patients with CHD or diabetic patients  with > or = 2 CHD risk factors. Marland Kitchen LDL-C is now calculated using the Martin-Hopkins  calculation, which is a validated novel method providing  better accuracy than the Friedewald equation in the  estimation of LDL-C.  Cresenciano Genre et al. Annamaria Helling. 1791;505(69): 2061-2068  (http://education.[employer].com/faq/FAQ164)          Passed - HDL in normal range and within 360 days    HDL  Date Value Ref Range Status  02/18/2019 52 > OR = 50 mg/dL Final  01/08/2015 62 >39 mg/dL Final         Passed - Patient is not pregnant      Passed - Valid encounter within last 12 months    Recent Outpatient Visits          3 months ago Lower abdominal pain   [place], [place]   4 months ago Secondary  hyperparathyroidism of renal origin [facility])   [facility] Steele Sizer, MD   10 months ago Dyslipidemia   [facility] Steele Sizer, MD   1 year ago Chronic kidney disease, stage III (moderate) [facility])   [facility] Steele Sizer, MD   1 year ago Chronic kidney disease, stage III (moderate) [facility])   [facility] Steele Sizer, MD      Future Appointments            In 3 weeks  [facility], [place]   In 1 month Steele Sizer, MD [place]

## 2019-07-01 ENCOUNTER — Other Ambulatory Visit
Admission: RE | Admit: 2019-07-01 | Discharge: 2019-07-01 | Disposition: A | Discharge status: Home / Self Care | Payer: Medicare HMO | Source: Ambulatory Visit | Attending: Ophthalmology | Admitting: Ophthalmology

## 2019-07-01 ENCOUNTER — Other Ambulatory Visit: Payer: Self-pay

## 2019-07-01 DIAGNOSIS — Z01812 Encounter for preprocedural laboratory examination: Secondary | ICD-10-CM | POA: Insufficient documentation

## 2019-07-01 DIAGNOSIS — Z20822 Contact with and (suspected) exposure to covid-19: Secondary | ICD-10-CM | POA: Insufficient documentation

## 2019-07-02 LAB — SARS CORONAVIRUS 2 (TAT 6-24 HRS): SARS Coronavirus 2: NEGATIVE

## 2019-07-02 NOTE — Discharge Instructions (Signed)

## 2019-07-03 ENCOUNTER — Ambulatory Visit
Admission: RE | Admit: 2019-07-03 | Discharge: 2019-07-03 | Disposition: A | Discharge status: Home / Self Care | Payer: Medicare HMO | Attending: Ophthalmology | Admitting: Ophthalmology

## 2019-07-03 ENCOUNTER — Ambulatory Visit: Payer: Medicare HMO | Admitting: Anesthesiology

## 2019-07-03 ENCOUNTER — Encounter
Admission: RE | Disposition: A | Discharge status: Home / Self Care | Payer: Self-pay | Source: Home / Self Care | Attending: Ophthalmology

## 2019-07-03 ENCOUNTER — Encounter: Payer: Self-pay | Admitting: Ophthalmology

## 2019-07-03 DIAGNOSIS — K5790 Diverticulosis of intestine, part unspecified, without perforation or abscess without bleeding: Secondary | ICD-10-CM | POA: Diagnosis not present

## 2019-07-03 DIAGNOSIS — K449 Diaphragmatic hernia without obstruction or gangrene: Secondary | ICD-10-CM | POA: Diagnosis not present

## 2019-07-03 DIAGNOSIS — K219 Gastro-esophageal reflux disease without esophagitis: Secondary | ICD-10-CM | POA: Insufficient documentation

## 2019-07-03 DIAGNOSIS — H401121 Primary open-angle glaucoma, left eye, mild stage: Secondary | ICD-10-CM | POA: Diagnosis not present

## 2019-07-03 DIAGNOSIS — M810 Age-related osteoporosis without current pathological fracture: Secondary | ICD-10-CM | POA: Diagnosis not present

## 2019-07-03 DIAGNOSIS — I10 Essential (primary) hypertension: Secondary | ICD-10-CM | POA: Diagnosis not present

## 2019-07-03 DIAGNOSIS — H2512 Age-related nuclear cataract, left eye: Secondary | ICD-10-CM | POA: Insufficient documentation

## 2019-07-03 DIAGNOSIS — G43909 Migraine, unspecified, not intractable, without status migrainosus: Secondary | ICD-10-CM | POA: Diagnosis not present

## 2019-07-03 DIAGNOSIS — Z9049 Acquired absence of other specified parts of digestive tract: Secondary | ICD-10-CM | POA: Insufficient documentation

## 2019-07-03 DIAGNOSIS — E785 Hyperlipidemia, unspecified: Secondary | ICD-10-CM | POA: Diagnosis not present

## 2019-07-03 DIAGNOSIS — M1990 Unspecified osteoarthritis, unspecified site: Secondary | ICD-10-CM | POA: Insufficient documentation

## 2019-07-03 DIAGNOSIS — Z90710 Acquired absence of both cervix and uterus: Secondary | ICD-10-CM | POA: Diagnosis not present

## 2019-07-03 DIAGNOSIS — E039 Hypothyroidism, unspecified: Secondary | ICD-10-CM | POA: Insufficient documentation

## 2019-07-03 DIAGNOSIS — H25812 Combined forms of age-related cataract, left eye: Secondary | ICD-10-CM | POA: Diagnosis not present

## 2019-07-03 HISTORY — DX: Nausea with vomiting, unspecified: R11.2

## 2019-07-03 HISTORY — PX: CATARACT EXTRACTION W/PHACO: SHX586

## 2019-07-03 HISTORY — DX: Dizziness and giddiness: R42

## 2019-07-03 HISTORY — DX: Other specified postprocedural states: Z98.890

## 2019-07-03 HISTORY — DX: Essential (primary) hypertension: I10

## 2019-07-03 SURGERY — PHACOEMULSIFICATION, CATARACT, WITH IOL INSERTION
Anesthesia: Monitor Anesthesia Care | Site: Eye | Laterality: Left

## 2019-07-03 MED ORDER — ONDANSETRON HCL 4 MG/2ML IJ SOLN: 4.0000 mg | Freq: Once | INTRAMUSCULAR | Status: DC | PRN

## 2019-07-03 MED ORDER — MIDAZOLAM HCL 2 MG/2ML IJ SOLN
INTRAMUSCULAR | Status: DC | PRN
  Administered 2019-07-03: 1 mg via INTRAVENOUS

## 2019-07-03 MED ORDER — TETRACAINE HCL 0.5 % OP SOLN
1.0000 [drp] | OPHTHALMIC | Status: DC | PRN
  Administered 2019-07-03 (×3): 1 [drp] via OPHTHALMIC

## 2019-07-03 MED ORDER — ACETAMINOPHEN 325 MG PO TABS: 325.0000 mg | ORAL_TABLET | ORAL | Status: DC | PRN

## 2019-07-03 MED ORDER — ARMC OPHTHALMIC DILATING DROPS
1.0000 "application " | OPHTHALMIC | Status: DC | PRN
  Administered 2019-07-03 (×3): 1 via OPHTHALMIC

## 2019-07-03 MED ORDER — LIDOCAINE HCL (PF) 2 % IJ SOLN
INTRAOCULAR | Status: DC | PRN
  Administered 2019-07-03: 2 mL

## 2019-07-03 MED ORDER — NA HYALUR & NA CHOND-NA HYALUR 0.4-0.35 ML IO KIT
PACK | INTRAOCULAR | Status: DC | PRN
  Administered 2019-07-03: 1 mL via INTRAOCULAR

## 2019-07-03 MED ORDER — FENTANYL CITRATE (PF) 100 MCG/2ML IJ SOLN
INTRAMUSCULAR | Status: DC | PRN
  Administered 2019-07-03: 50 ug via INTRAVENOUS

## 2019-07-03 MED ORDER — CEFUROXIME OPHTHALMIC INJECTION 1 MG/0.1 ML
INJECTION | OPHTHALMIC | Status: DC | PRN
  Administered 2019-07-03: 0.1 mL via INTRACAMERAL

## 2019-07-03 MED ORDER — TRYPAN BLUE 0.06 % OP SOLN
OPHTHALMIC | Status: DC | PRN
  Administered 2019-07-03: 0.5 mL via INTRAOCULAR

## 2019-07-03 MED ORDER — ACETAMINOPHEN 160 MG/5ML PO SOLN: 325.0000 mg | ORAL | Status: DC | PRN

## 2019-07-03 MED ORDER — EPINEPHRINE PF 1 MG/ML IJ SOLN
INTRAOCULAR | Status: DC | PRN
  Administered 2019-07-03: 78 mL via OPHTHALMIC

## 2019-07-03 MED ORDER — NA CHONDROIT SULF-NA HYALURON 40-30 MG/ML IO SOLN
INTRAOCULAR | Status: DC | PRN
  Administered 2019-07-03: 0.5 mL via INTRAOCULAR

## 2019-07-03 MED ORDER — MOXIFLOXACIN HCL 0.5 % OP SOLN
1.0000 [drp] | OPHTHALMIC | Status: DC | PRN
  Administered 2019-07-03 (×3): 1 [drp] via OPHTHALMIC

## 2019-07-03 SURGICAL SUPPLY — 24 items
BLADE DUAL KAHOOK SINGLE USE (BLADE) ×1 IMPLANT
CANNULA ANT/CHMB 27G (MISCELLANEOUS) ×1 IMPLANT
CANNULA ANT/CHMB 27GA (MISCELLANEOUS) ×4 IMPLANT
GLOVE SURG LX 7.5 STRW (GLOVE) ×1
GLOVE SURG LX STRL 7.5 STRW (GLOVE) ×1 IMPLANT
GLOVE SURG TRIUMPH 8.0 PF LTX (GLOVE) ×2 IMPLANT
GOWN STRL REUS W/ TWL LRG LVL3 (GOWN DISPOSABLE) ×2 IMPLANT
GOWN STRL REUS W/TWL LRG LVL3 (GOWN DISPOSABLE) ×4
ICLIP (OPHTHALMIC RELATED) ×1 IMPLANT
LENS IOL TECNIS EYHANCE 20.0 ×1 IMPLANT
MARKER SKIN DUAL TIP RULER LAB (MISCELLANEOUS) ×2 IMPLANT
NDL CAPSULORHEX 25GA (NEEDLE) ×1 IMPLANT
NDL FILTER BLUNT 18X1 1/2 (NEEDLE) ×2 IMPLANT
NEEDLE CAPSULORHEX 25GA (NEEDLE) ×2 IMPLANT
NEEDLE FILTER BLUNT 18X 1/2SAF (NEEDLE) ×2
NEEDLE FILTER BLUNT 18X1 1/2 (NEEDLE) ×2 IMPLANT
PACK CATARACT BRASINGTON (MISCELLANEOUS) ×2 IMPLANT
PACK EYE AFTER SURG (MISCELLANEOUS) ×2 IMPLANT
PACK OPTHALMIC (MISCELLANEOUS) ×2 IMPLANT
SOLUTION OPHTHALMIC SALT (MISCELLANEOUS) ×2 IMPLANT
SYR 3ML LL SCALE MARK (SYRINGE) ×4 IMPLANT
SYR TB 1ML LUER SLIP (SYRINGE) ×2 IMPLANT
WATER STERILE IRR 250ML POUR (IV SOLUTION) ×2 IMPLANT
WIPE NON LINTING 3.25X3.25 (MISCELLANEOUS) ×2 IMPLANT

## 2019-07-03 NOTE — H&P (Signed)

## 2019-07-03 NOTE — Anesthesia Postprocedure Evaluation (Signed)
Anesthesia Post Note  Patient: Theresa Hall  Procedure(s) Performed: CATARACT EXTRACTION PHACO AND INTRAOCULAR LENS PLACEMENT (IOC) LEFT [place]OOK DUAL BLADE GONIOTOMY 9.84 01:14.5 13.2% (Left Eye)     Patient location during evaluation: PACU Anesthesia Type: MAC Level of consciousness: awake Pain management: pain level controlled Vital Signs Assessment: post-procedure vital signs reviewed and stable Respiratory status: respiratory function stable Cardiovascular status: stable Postop Assessment: no apparent nausea or vomiting Anesthetic complications: no   No complications documented.  Veda Canning

## 2019-07-03 NOTE — Anesthesia Preprocedure Evaluation (Signed)
Anesthesia Evaluation  Patient identified by MRN, date of birth, ID band Patient awake    Reviewed: Allergy & Precautions, NPO status   History of Anesthesia Complications (+) PONV  Airway Mallampati: II  TM Distance: >3 FB     Dental   Pulmonary    breath sounds clear to auscultation       Cardiovascular hypertension,  Rhythm:Regular Rate:Normal  HLD   Neuro/Psych  Headaches,    GI/Hepatic GERD  ,  Endo/Other  Hypothyroidism   Renal/GU Renal InsufficiencyRenal disease     Musculoskeletal  (+) Arthritis ,   Abdominal   Peds  Hematology   Anesthesia Other Findings   Reproductive/Obstetrics                             Anesthesia Physical Anesthesia Plan  ASA: III  Anesthesia Plan: MAC   Post-op Pain Management:    Induction: Intravenous  PONV Risk Score and Plan: TIVA, Midazolam and Treatment may vary due to age or medical condition  Airway Management Planned: Natural Airway and Nasal Cannula  Additional Equipment:   Intra-op Plan:   Post-operative Plan:   Informed Consent: I have reviewed the patients History and Physical, chart, labs and discussed the procedure including the risks, benefits and alternatives for the proposed anesthesia with the patient or authorized representative who has indicated his/her understanding and acceptance.       Plan Discussed with: CRNA  Anesthesia Plan Comments:         Anesthesia Quick Evaluation

## 2019-07-03 NOTE — Transfer of Care (Signed)
Immediate Anesthesia Transfer of Care Note  Patient: Theresa Hall  Procedure(s) Performed: CATARACT EXTRACTION PHACO AND INTRAOCULAR LENS PLACEMENT (IOC) LEFT KAHOOK DUAL BLADE GONIOTOMY 9.84 01:14.5 13.2% (Left Eye)  Patient Location: PACU  Anesthesia Type: MAC  Level of Consciousness: awake, alert  and patient cooperative  Airway and Oxygen Therapy: Patient Spontanous Breathing and Patient connected to supplemental oxygen  Post-op Assessment: Post-op Vital signs reviewed, Patient's Cardiovascular Status Stable, Respiratory Function Stable, Patent Airway and No signs of Nausea or vomiting  Post-op Vital Signs: Reviewed and stable  Complications: No complications documented.

## 2019-07-03 NOTE — Anesthesia Procedure Notes (Signed)
Procedure Name: MAC Date/Time: 07/03/2019 9:27 AM Performed by: Cameron Ali, CRNA Pre-anesthesia Checklist: Patient identified, Emergency Drugs available, Suction available, Timeout performed and Patient being monitored Patient Re-evaluated:Patient Re-evaluated prior to induction Oxygen Delivery Method: Nasal cannula Placement Confirmation: positive ETCO2

## 2019-07-03 NOTE — Op Note (Signed)
PREOPERATIVE DIAGNOSIS:  Nuclear sclerotic cataract left eye. 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 Item Serial No. Manufacturer Lot No. LRB No. Used Action  LENS II EYHANCE 20.0 - [id number]  LENS II EYHANCE 20.0 [id number] [employer]  Left 1 Implanted    SURGEON:  Wyonia Hough, MD   ANESTHESIA:  Topical with tetracaine drops augmented with 1% preservative-free intracameral lidocaine.    COMPLICATIONS:  None.   DESCRIPTION OF PROCEDURE:  The patient was identified in the holding room and transported to the operating room and placed in the supine position under the operating microscope.  The left eye was identified as the operative eye and it was prepped and draped in the usual sterile ophthalmic fashion.   A 1 millimeter clear-corneal paracentesis was made at the 5:30 position.  0.5 ml of preservative-free 1% lidocaine was injected into the anterior chamber.  The anterior chamber was filled with Viscoat viscoelastic.  A 2.4 millimeter keratome was used to make a near-clear corneal incision at the 2:30 position. The microscope was adjusted and a gonioprism was used to visulaize the trabecular meshwork.  The [facility] Dual Blade was advanced across the anterior chamber under viscoelastic.  The blade was used to mark the trabecular meshwork at the 7:30 position.  The blade was placed two clock hours clockwise into the meshwork.  Proper postioning was confirmed.  The blade ws passed counterclockwise  through the meshwork to excise approximately two to three clock-hours of trabecular meshwork.   A curvilinear capsulorrhexis was made with a cystotome and capsulorrhexis forceps.  Balanced salt solution was used to hydrodissect and hydrodelineate the nucleus.   Phacoemulsification was then used in stop and chop fashion to remove the lens nucleus and epinucleus.  The remaining cortex was then removed using the irrigation and aspiration handpiece. Provisc was then placed into the capsular bag to distend it for lens placement.  A lens was then injected into the capsular bag.  The remaining viscoelastic was aspirated.   Wounds were hydrated with balanced salt solution.  The anterior chamber was inflated to a physiologic pressure with balanced salt solution.  No wound leaks were noted. Cefuroxime 0.1 ml of a 10mg /ml solution was injected into the anterior chamber for a dose of 1 mg of intracameral antibiotic at the completion of the case.  The patient was taken to the recovery room in stable condition without complications of anesthesia or surgery.

## 2019-07-04 ENCOUNTER — Encounter: Payer: Self-pay | Admitting: Ophthalmology

## 2019-07-18 DIAGNOSIS — D631 Anemia in chronic kidney disease: Secondary | ICD-10-CM | POA: Diagnosis not present

## 2019-07-18 DIAGNOSIS — I10 Essential (primary) hypertension: Secondary | ICD-10-CM | POA: Diagnosis not present

## 2019-07-18 DIAGNOSIS — E785 Hyperlipidemia, unspecified: Secondary | ICD-10-CM | POA: Diagnosis not present

## 2019-07-18 DIAGNOSIS — N2581 Secondary hyperparathyroidism of renal origin: Secondary | ICD-10-CM | POA: Diagnosis not present

## 2019-07-18 DIAGNOSIS — N183 Chronic kidney disease, stage 3 unspecified: Secondary | ICD-10-CM | POA: Diagnosis not present

## 2019-07-22 DIAGNOSIS — N2581 Secondary hyperparathyroidism of renal origin: Secondary | ICD-10-CM | POA: Diagnosis not present

## 2019-07-22 DIAGNOSIS — I129 Hypertensive chronic kidney disease with stage 1 through stage 4 chronic kidney disease, or unspecified chronic kidney disease: Secondary | ICD-10-CM | POA: Insufficient documentation

## 2019-07-22 DIAGNOSIS — N1831 Chronic kidney disease, stage 3a: Secondary | ICD-10-CM | POA: Diagnosis not present

## 2019-07-23 ENCOUNTER — Ambulatory Visit (INDEPENDENT_AMBULATORY_CARE_PROVIDER_SITE_OTHER): Payer: Medicare HMO

## 2019-07-23 DIAGNOSIS — Z0000 Encounter for general adult medical examination without abnormal findings: Secondary | ICD-10-CM

## 2019-07-23 NOTE — Patient Instructions (Signed)
Theresa Hall , Thank you for taking time to come for your Medicare Wellness Visit. I appreciate your ongoing commitment to your health goals. Please review the following plan we discussed and let me know if I can assist you in the future.   Screening recommendations/referrals: Colonoscopy: no longer required Mammogram: done 11/07/18 Bone Density: done 11/07/18 Recommended yearly ophthalmology/optometry visit for glaucoma screening and checkup Recommended yearly dental visit for hygiene and checkup  Vaccinations: Influenza vaccine: done 10/16/18 Pneumococcal vaccine: done 09/28/15 Tdap vaccine: done 03/03/10 Shingles vaccine: Shingrix discussed. Please contact your pharmacy for coverage information.  Covid-19: done 01/29/19 & 02/21/19  Advanced directives: Please bring a copy of your health care power of attorney and living will to the office at your convenience.  Conditions/risks identified: Recommend increasing physical activity to at least 3 days per week  Next appointment: Follow up in one year for your annual wellness visit    Preventive Care 65 Years and Older, Female Preventive care refers to lifestyle choices and visits with your health care provider that can promote health and wellness. What does preventive care include?  A yearly physical exam. This is also called an annual well check.  Dental exams once or twice a year.  Routine eye exams. Ask your health care provider how often you should have your eyes checked.  Personal lifestyle choices, including:  Daily care of your teeth and gums.  Regular physical activity.  Eating a healthy diet.  Avoiding tobacco and drug use.  Limiting alcohol use.  Practicing safe sex.  Taking low-dose aspirin every day.  Taking vitamin and mineral supplements as recommended by your health care provider. What happens during an annual well check? The services and screenings done by your health care provider during your annual well  check will depend on your age, overall health, lifestyle risk factors, and family history of disease. Counseling  Your health care provider may ask you questions about your:  Alcohol use.  Tobacco use.  Drug use.  Emotional well-being.  Home and relationship well-being.  Sexual activity.  Eating habits.  History of falls.  Memory and ability to understand (cognition).  Work and work Statistician.  Reproductive health. Screening  You may have the following tests or measurements:  Height, weight, and BMI.  Blood pressure.  Lipid and cholesterol levels. These may be checked every 5 years, or more frequently if you are over 17 years old.  Skin check.  Lung cancer screening. You may have this screening every year starting at age 67 if you have a 30-pack-year history of smoking and currently smoke or have quit within the past 15 years.  Fecal occult blood test (FOBT) of the stool. You may have this test every year starting at age 74.  Flexible sigmoidoscopy or colonoscopy. You may have a sigmoidoscopy every 5 years or a colonoscopy every 10 years starting at age 35.  Hepatitis C blood test.  Hepatitis B blood test.  Sexually transmitted disease (STD) testing.  Diabetes screening. This is done by checking your blood sugar (glucose) after you have not eaten for a while (fasting). You may have this done every 1-3 years.  Bone density scan. This is done to screen for osteoporosis. You may have this done starting at age 63.  Mammogram. This may be done every 1-2 years. Talk to your health care provider about how often you should have regular mammograms. Talk with your health care provider about your test results, treatment options, and if necessary, the need  for more tests. Vaccines  Your health care provider may recommend certain vaccines, such as:  Influenza vaccine. This is recommended every year.  Tetanus, diphtheria, and acellular pertussis (Tdap, Td) vaccine. You  may need a Td booster every 10 years.  Zoster vaccine. You may need this after age 34.  Pneumococcal 13-valent conjugate (PCV13) vaccine. One dose is recommended after age 61.  Pneumococcal polysaccharide (PPSV23) vaccine. One dose is recommended after age 28. Talk to your health care provider about which screenings and vaccines you need and how often you need them. This information is not intended to replace advice given to you by your health care provider. Make sure you discuss any questions you have with your health care provider. Document Released: 01/23/2015 Document Revised: 09/16/2015 Document Reviewed: 10/28/2014 Elsevier Interactive Patient Education  2017 [place] Prevention in the Home Falls can cause injuries. They can happen to people of all ages. There are many things you can do to make your home safe and to help prevent falls. What can I do on the outside of my home?  Regularly fix the edges of walkways and driveways and fix any cracks.  Remove anything that might make you trip as you walk through a door, such as a raised step or threshold.  Trim any bushes or trees on the path to your home.  Use bright outdoor lighting.  Clear any walking paths of anything that might make someone trip, such as rocks or tools.  Regularly check to see if handrails are loose or broken. Make sure that both sides of any steps have handrails.  Any raised decks and porches should have guardrails on the edges.  Have any leaves, snow, or ice cleared regularly.  Use sand or salt on walking paths during winter.  Clean up any spills in your garage right away. This includes oil or grease spills. What can I do in the bathroom?  Use night lights.  Install grab bars by the toilet and in the tub and shower. Do not use towel bars as grab bars.  Use non-skid mats or decals in the tub or shower.  If you need to sit down in the shower, use a plastic, non-slip stool.  Keep the floor  dry. Clean up any water that spills on the floor as soon as it happens.  Remove soap buildup in the tub or shower regularly.  Attach bath mats securely with double-sided non-slip rug tape.  Do not have throw rugs and other things on the floor that can make you trip. What can I do in the bedroom?  Use night lights.  Make sure that you have a light by your bed that is easy to reach.  Do not use any sheets or blankets that are too big for your bed. They should not hang down onto the floor.  Have a firm chair that has side arms. You can use this for support while you get dressed.  Do not have throw rugs and other things on the floor that can make you trip. What can I do in the kitchen?  Clean up any spills right away.  Avoid walking on wet floors.  Keep items that you use a lot in easy-to-reach places.  If you need to reach something above you, use a strong step stool that has a grab bar.  Keep electrical cords out of the way.  Do not use floor polish or wax that makes floors slippery. If you must use wax, use  non-skid floor wax.  Do not have throw rugs and other things on the floor that can make you trip. What can I do with my stairs?  Do not leave any items on the stairs.  Make sure that there are handrails on both sides of the stairs and use them. Fix handrails that are broken or loose. Make sure that handrails are as long as the stairways.  Check any carpeting to make sure that it is firmly attached to the stairs. Fix any carpet that is loose or worn.  Avoid having throw rugs at the top or bottom of the stairs. If you do have throw rugs, attach them to the floor with carpet tape.  Make sure that you have a light switch at the top of the stairs and the bottom of the stairs. If you do not have them, ask someone to add them for you. What else can I do to help prevent falls?  Wear shoes that:  Do not have high heels.  Have rubber bottoms.  Are comfortable and fit you  well.  Are closed at the toe. Do not wear sandals.  If you use a stepladder:  Make sure that it is fully opened. Do not climb a closed stepladder.  Make sure that both sides of the stepladder are locked into place.  Ask someone to hold it for you, if possible.  Clearly mark and make sure that you can see:  Any grab bars or handrails.  First and last steps.  Where the edge of each step is.  Use tools that help you move around (mobility aids) if they are needed. These include:  Canes.  Walkers.  Scooters.  Crutches.  Turn on the lights when you go into a dark area. Replace any light bulbs as soon as they burn out.  Set up your furniture so you have a clear path. Avoid moving your furniture around.  If any of your floors are uneven, fix them.  If there are any pets around you, be aware of where they are.  Review your medicines with your doctor. Some medicines can make you feel dizzy. This can increase your chance of falling. Ask your doctor what other things that you can do to help prevent falls. This information is not intended to replace advice given to you by your health care provider. Make sure you discuss any questions you have with your health care provider. Document Released: 10/23/2008 Document Revised: 06/04/2015 Document Reviewed: 01/31/2014 Elsevier Interactive Patient Education  2017 [employer].

## 2019-07-23 NOTE — Progress Notes (Signed)
Subjective:   Theresa Hall is a 79 y.o. female who presents for Medicare Annual (Subsequent) preventive examination.  Review of Systems     Cardiac Risk Factors include: advanced age (>85men, >96 women);dyslipidemia;hypertension     Objective:    There were no vitals filed for this visit. There is no height or weight on file to calculate BMI.  Advanced Directives 07/23/2019 07/03/2019 07/20/2018 07/18/2017 06/20/2016 11/11/2015 08/15/2015  Does Patient Have a Medical Advance Directive? Yes Yes Yes Yes Yes Yes Yes  Type of Paramedic of [place];Living will [place];Living will [place];Living will [place];Living will [place];Living will [place];Living will Living will  Does patient want to make changes to medical advance directive? - No - Patient declined - - - No - Patient declined -  Copy of [place] in Chart? No - copy requested No - copy requested No - copy requested No - copy requested No - copy requested No - copy requested No - copy requested    Current Medications (verified) Outpatient Encounter Medications as of 07/23/2019  Medication Sig   alendronate (FOSAMAX) 35 MG tablet TAKE 1 TABLET BY MOUTH EVERY 7 DAYS. TAKE WITH WATER ON AN EMPTY STOMACH.   ASPIRIN LOW DOSE 81 MG tablet Take 1 tablet by mouth daily.    azelastine (ASTELIN) 0.1 % nasal spray PLACE 2 SPRAYS INTO BOTH NOSTRILS 2 (TWO) TIMES DAILY. USE IN EACH NOSTRIL AS DIRECTED   Calcium Polycarbophil (FIBERCON PO) Take 1 tablet by mouth at bedtime.    CALCIUM-MAGNESIUM-ZINC PO Take by mouth.   cholecalciferol (VITAMIN D) 1000 units tablet Take 1 capsule by mouth daily.   famotidine (PEPCID) 40 MG tablet Take 1 tablet (40 mg total) by mouth daily.   Fish Oil-Cholecalciferol (FISH OIL + D3) 1200-1000 MG-UNIT CAPS Take 1 capsule by mouth daily.   irbesartan (AVAPRO) 75 MG  tablet Take 1 tablet (75 mg total) by mouth daily.   latanoprost (XALATAN) 0.005 % ophthalmic solution Place 1 drop into both eyes at bedtime.   Multiple Vitamin (MULTIVITAMIN) tablet Take 1 tablet by mouth daily.   pravastatin (PRAVACHOL) 80 MG tablet TAKE 1 TABLET BY MOUTH EVERY DAY   SYNTHROID 88 MCG tablet TAKE 1 TABLET BY MOUTH DAILY. TAKE 1 AND 1/2 TABLETS ON TUESDAYS AND THURSDAYS (Patient taking differently: TAKE 1 TABLET BY MOUTH DAILY. TAKE 1 AND 1/2 TABLETS ON TUESDAYS)   timolol (TIMOPTIC) 0.5 % ophthalmic solution Place 1 drop into both eyes every morning.   topiramate (TOPAMAX) 50 MG tablet Take 1 tablet (50 mg total) by mouth every evening.   vitamin C (ASCORBIC ACID) 500 MG tablet Take 500 mg by mouth daily.   [DISCONTINUED] clobetasol cream (TEMOVATE) 0.05 % Apply 1 g topically daily as needed. (Patient not taking: Reported on 07/03/2019)   No facility-administered encounter medications on file as of 07/23/2019.    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   Cancer ([place]) 2006   melanoma on shoulder    Chronic kidney disease, stage I    Chronic kidney disease, stage III (moderate)    Diverticulitis of colon (without mention of hemorrhage)(562.11)    GERD (gastroesophageal reflux disease)    Glaucoma    Hypertension  Migraine without aura, without mention of intractable migraine without mention of status migrainosus    Osteoarthritis    Osteopenia    Other abnormal glucose    Other and unspecified hyperlipidemia    PONV (postoperative nausea and vomiting)    Postablative ovarian failure    Pure hypercholesterolemia    Unspecified glaucoma(365.9)    Unspecified hypothyroidism    Vertigo    none for over 2 yrs   Past Surgical History:  Procedure Laterality Date   APPENDECTOMY      BASAL CELL CARCINOMA EXCISION     BASAL CELL CARCINOMA EXCISION  2014   nasal   CATARACT EXTRACTION W/PHACO Left 07/03/2019   Procedure: CATARACT EXTRACTION PHACO AND INTRAOCULAR LENS PLACEMENT (IOC) LEFT [place] DUAL BLADE GONIOTOMY 9.84 01:14.5 13.2%;  Surgeon: Leandrew Koyanagi, MD;  Location: [facility];  Service: Ophthalmology;  Laterality: Left;   CHOLECYSTECTOMY     COLONOSCOPY     ORIF PATELLA Right 08/15/2015   Procedure: OPEN REDUCTION INTERNAL (ORIF) FIXATION PATELLA  and application wound vac;  Surgeon: Corky Mull, MD;  Location: [facility];  Service: Orthopedics;  Laterality: Right;   TONSILLECTOMY     VAGINAL HYSTERECTOMY     Family History  Problem Relation Age of Onset   Heart attack Father        x2   Heart disease Sister    Kidney disease Sister    Diabetes Sister    Heart attack Brother    Diabetes Brother    Heart attack Brother    Breast cancer Neg Hx    Social History   Socioeconomic History   Marital status: Widowed    Spouse name: Gershon Mussel   Number of children: 2   Years of education: some college   Highest education level: 12th grade  Occupational History   Occupation: Retired  Tobacco Use   Smoking status: Never Smoker   Smokeless tobacco: Never Used   Tobacco comment: smoking cessation materials not required  Vaping Use   Vaping Use: Never used  Substance and Sexual Activity   Alcohol use: No   Drug use: No   Sexual activity: Not Currently    Birth control/protection: Surgical    Comment: TAH-1989  Other Topics Concern   Not on file  Social History Narrative   She has been a widow since 2009. Pt lives alone.    Social Determinants of Health   Financial Resource Strain: Low Risk    Difficulty of Paying Living Expenses: Not hard at all  Food Insecurity: No Food Insecurity   Worried About Charity fundraiser in the Last Year: Never true   [place] in the Last Year: Never true    Transportation Needs: No Transportation Needs   Lack of Transportation (Medical): No   Lack of Transportation (Non-Medical): No  Physical Activity: Insufficiently Active   Days of Exercise per Week: 7 days   Minutes of Exercise per Session: 10 min  Stress: No Stress Concern Present   Feeling of Stress : Not at all  Social Connections: Moderately Integrated   Frequency of Communication with Friends and Family: More than three times a week   Frequency of Social Gatherings with Friends and Family: Three times a week   Attends Religious Services: More than 4 times per year   Active Member of Clubs or Organizations: Yes   Attends Archivist Meetings: More than 4 times per year   Marital Status: Widowed  Tobacco Counseling Counseling given: Not Answered Comment: smoking cessation materials not required   Clinical Intake:  Pre-visit preparation completed: Yes  Pain : No/denies pain     Nutritional Risks: None Diabetes: No  How often do you need to have someone help you when you read instructions, pamphlets, or other written materials from your doctor or pharmacy?: 1 - Never    Interpreter Needed?: No  Information entered by :: Clemetine Marker LPN   Activities of Daily Living In your present state of health, do you have any difficulty performing the following activities: 07/23/2019 07/03/2019  Hearing? N -  Comment declines hearing aids -  Vision? N -  Difficulty concentrating or making decisions? N -  Walking or climbing stairs? N -  Dressing or bathing? N -  Doing errands, shopping? N N  Preparing Food and eating ? N -  Using the Toilet? N -  In the past six months, have you accidently leaked urine? N -  Do you have problems with loss of bowel control? N -  Managing your Medications? N -  Managing your Finances? N -  Housekeeping or managing your Housekeeping? N -  Some recent data might be hidden    Patient Care Team: Steele Sizer, MD as  PCP - General (Family Medicine) Lavonia Dana, MD as Consulting Physician (Nephrology) Dasher, Rayvon Char, MD as Consulting Physician (Dermatology) Kem Boroughs, FNP as Nurse Practitioner (Gynecology) Neldon Labella, RN as Case Manager  Indicate any recent Medical Services you may have received from other than [facility] providers in the past year (date may be approximate).     Assessment:   This is a routine wellness examination for [place].  Hearing/Vision screen  Hearing Screening   125Hz  250Hz  500Hz  1000Hz  2000Hz  3000Hz  4000Hz  6000Hz  8000Hz   Right ear:           Left ear:           Comments: Pt denies hearing difficulty  Vision Screening Comments: Annual visions screenings done at [facility] Dr. Wallace Going  Dietary issues and exercise activities discussed: Current Exercise Habits: Home exercise routine, Type of exercise: stretching, Time (Minutes): 10, Frequency (Times/Week): 7, Weekly Exercise (Minutes/Week): 70, Intensity: Mild, Exercise limited by: None identified  Goals     DIET - INCREASE WATER INTAKE     Recommend to drink at least 6-8 8oz glasses of water per day.      Depression Screen PHQ 2/9 Scores 07/23/2019 03/25/2019 02/18/2019 08/13/2018 07/20/2018 01/23/2018 07/18/2017  PHQ - 2 Score 0 0 0 0 0 1 0  PHQ- 9 Score - 1 0 0 - 7 0  Exception Documentation - - - - - (No Data) -    Fall Risk Fall Risk  07/23/2019 03/25/2019 02/18/2019 08/13/2018 07/20/2018  Falls in the past year? 0 0 0 0 0  Comment - - - - -  Number falls in past yr: 0 0 0 0 0  Injury with Fall? 0 0 0 0 0  Comment - - - - -  Risk for fall due to : No Fall Risks - - - -  Risk for fall due to: Comment - - - - -  Follow up Falls prevention discussed - - - Falls prevention discussed    Any stairs in or around the home? Yes  If so, are there any without handrails? Yes  Home free of loose throw rugs in walkways, pet beds, electrical cords, etc? Yes  Adequate lighting in your home to  reduce risk of  falls? Yes   ASSISTIVE DEVICES UTILIZED TO PREVENT FALLS:  Life alert? No  Use of a cane, walker or w/c? No  Grab bars in the bathroom? No  Shower chair or bench in shower? Yes  Elevated toilet seat or a handicapped toilet? Yes   TIMED UP AND GO:  Was the test performed? No . Telephonic visit.   Cognitive Function:     6CIT Screen 07/23/2019 07/20/2018 07/18/2017  What Year? 0 points 0 points 0 points  What month? 0 points 0 points 0 points  What time? 0 points 0 points 0 points  Count back from 20 0 points 0 points 0 points  Months in reverse 0 points 0 points 0 points  Repeat phrase 0 points 0 points 0 points  Total Score 0 0 0    Immunizations Immunization History  Administered Date(s) Administered   Fluad Quad(high Dose 65+) 10/16/2018   Influenza Split 11/01/2006, 10/16/2008   Influenza, High Dose Seasonal PF 12/12/2014, 09/28/2015, 10/18/2016, 10/13/2017   Influenza, Seasonal, Injecte, Preservative Fre 09/07/2010, 01/16/2012   Influenza,inj,Quad PF,6+ Mos 12/10/2012, 10/14/2013   PFIZER SARS-COV-2 Vaccination 01/29/2019, 02/21/2019   Pneumococcal Conjugate-13 12/17/2013   Pneumococcal Polysaccharide-23 09/28/2015   Tdap 03/03/2010   Zoster 11/28/2007    TDAP status: Up to date   Flu Vaccine status: Up to date   Pneumococcal vaccine status: Up to date   Covid-19 vaccine status: Completed vaccines  Qualifies for Shingles Vaccine? Yes   Zostavax completed Yes   Shingrix Completed?: No.    Education has been provided regarding the importance of this vaccine. Patient has been advised to call insurance company to determine out of pocket expense if they have not yet received this vaccine. Advised may also receive vaccine at local pharmacy or Health Dept. Verbalized acceptance and understanding.  Screening Tests Health Maintenance  Topic Date Due   INFLUENZA VACCINE  08/11/2019   MAMMOGRAM  11/07/2019   TETANUS/TDAP  03/03/2020   DEXA SCAN   Completed   COVID-19 Vaccine  Completed   PNA vac Low Risk Adult  Completed    Health Maintenance  There are no preventive care reminders to display for this patient.  Colorectal cancer screening: No longer required.    Mammogram status: Completed 11/07/18. Repeat every year   Bone Density status: Completed 11/07/18. Results reflect: Bone density results: OSTEOPENIA. Repeat every 2 years.  Lung Cancer Screening: (Low Dose CT Chest recommended if Age 78-80 years, 30 pack-year currently smoking OR have quit w/in 15years.) does not qualify.   Additional Screening:  Hepatitis C Screening: no longer required  Vision Screening: Recommended annual ophthalmology exams for early detection of glaucoma and other disorders of the eye. Is the patient up to date with their annual eye exam?  Yes  Who is the provider or what is the name of the office in which the patient attends annual eye exams? [place] Screening: Recommended annual dental exams for proper oral hygiene  Community Resource Referral / Chronic Care Management: CRR required this visit?  No   CCM required this visit?  No      Plan:     I have personally reviewed and noted the following in the patients chart:    Medical and social history  Use of alcohol, tobacco or illicit drugs   Current medications and supplements  Functional ability and status  Nutritional status  Physical activity  Advanced directives  List of other physicians  Hospitalizations,  surgeries, and ER visits in previous 12 months  Vitals  Screenings to include cognitive, depression, and falls  Referrals and appointments  In addition, I have reviewed and discussed with patient certain preventive protocols, quality metrics, and best practice recommendations. A written personalized care plan for preventive services as well as general preventive health recommendations were provided to patient.     Clemetine Marker,  LPN   [id number]   Nurse Notes: pt doing well and appreciative of visit today

## 2019-07-29 DIAGNOSIS — I10 Essential (primary) hypertension: Secondary | ICD-10-CM | POA: Diagnosis not present

## 2019-07-29 DIAGNOSIS — H2511 Age-related nuclear cataract, right eye: Secondary | ICD-10-CM | POA: Diagnosis not present

## 2019-07-30 ENCOUNTER — Encounter: Payer: Self-pay | Admitting: Ophthalmology

## 2019-08-02 ENCOUNTER — Other Ambulatory Visit: Payer: Self-pay | Admitting: Family Medicine

## 2019-08-02 DIAGNOSIS — M8580 Other specified disorders of bone density and structure, unspecified site: Secondary | ICD-10-CM

## 2019-08-05 ENCOUNTER — Telehealth: Payer: Self-pay

## 2019-08-05 ENCOUNTER — Telehealth: Payer: Medicare HMO

## 2019-08-05 ENCOUNTER — Other Ambulatory Visit
Admission: RE | Admit: 2019-08-05 | Discharge: 2019-08-05 | Disposition: A | Discharge status: Home / Self Care | Payer: Medicare HMO | Source: Ambulatory Visit | Attending: Ophthalmology | Admitting: Ophthalmology

## 2019-08-05 ENCOUNTER — Other Ambulatory Visit: Payer: Self-pay

## 2019-08-05 DIAGNOSIS — Z20822 Contact with and (suspected) exposure to covid-19: Secondary | ICD-10-CM | POA: Insufficient documentation

## 2019-08-05 DIAGNOSIS — Z01812 Encounter for preprocedural laboratory examination: Secondary | ICD-10-CM | POA: Diagnosis not present

## 2019-08-05 NOTE — Discharge Instructions (Signed)

## 2019-08-05 NOTE — Telephone Encounter (Signed)
  Chronic Care Management   Outreach Note  08/05/2019 Name: Theresa Hall MRN: [id number] DOB: 04-Aug-1940  Primary Care Provider: Steele Sizer, MD Reason for referral : Chronic Care Management   An unsuccessful telephone outreach was attempted today. [name] was referred to the case management team for assistance with care management and care coordination.    A HIPAA compliant voice message was left requesting a return call.   Follow Up Plan: The care management team will reach out to [name] again within the next two weeks.   [facility] Care Management [phone]

## 2019-08-06 LAB — SARS CORONAVIRUS 2 (TAT 6-24 HRS): SARS Coronavirus 2: NEGATIVE

## 2019-08-07 ENCOUNTER — Encounter
Admission: RE | Disposition: A | Discharge status: Home / Self Care | Payer: Self-pay | Source: Home / Self Care | Attending: Ophthalmology

## 2019-08-07 ENCOUNTER — Ambulatory Visit: Payer: Medicare HMO | Admitting: Anesthesiology

## 2019-08-07 ENCOUNTER — Encounter: Payer: Self-pay | Admitting: Ophthalmology

## 2019-08-07 ENCOUNTER — Ambulatory Visit
Admission: RE | Admit: 2019-08-07 | Discharge: 2019-08-07 | Disposition: A | Discharge status: Home / Self Care | Payer: Medicare HMO | Attending: Ophthalmology | Admitting: Ophthalmology

## 2019-08-07 ENCOUNTER — Other Ambulatory Visit: Payer: Self-pay

## 2019-08-07 DIAGNOSIS — E039 Hypothyroidism, unspecified: Secondary | ICD-10-CM | POA: Diagnosis not present

## 2019-08-07 DIAGNOSIS — Z85828 Personal history of other malignant neoplasm of skin: Secondary | ICD-10-CM | POA: Insufficient documentation

## 2019-08-07 DIAGNOSIS — Z79890 Hormone replacement therapy (postmenopausal): Secondary | ICD-10-CM | POA: Diagnosis not present

## 2019-08-07 DIAGNOSIS — H2511 Age-related nuclear cataract, right eye: Secondary | ICD-10-CM | POA: Insufficient documentation

## 2019-08-07 DIAGNOSIS — E7800 Pure hypercholesterolemia, unspecified: Secondary | ICD-10-CM | POA: Insufficient documentation

## 2019-08-07 DIAGNOSIS — H401111 Primary open-angle glaucoma, right eye, mild stage: Secondary | ICD-10-CM | POA: Insufficient documentation

## 2019-08-07 DIAGNOSIS — Z79899 Other long term (current) drug therapy: Secondary | ICD-10-CM | POA: Diagnosis not present

## 2019-08-07 DIAGNOSIS — N189 Chronic kidney disease, unspecified: Secondary | ICD-10-CM | POA: Diagnosis not present

## 2019-08-07 DIAGNOSIS — I129 Hypertensive chronic kidney disease with stage 1 through stage 4 chronic kidney disease, or unspecified chronic kidney disease: Secondary | ICD-10-CM | POA: Insufficient documentation

## 2019-08-07 DIAGNOSIS — G43909 Migraine, unspecified, not intractable, without status migrainosus: Secondary | ICD-10-CM | POA: Insufficient documentation

## 2019-08-07 DIAGNOSIS — Z7983 Long term (current) use of bisphosphonates: Secondary | ICD-10-CM | POA: Diagnosis not present

## 2019-08-07 DIAGNOSIS — Z7982 Long term (current) use of aspirin: Secondary | ICD-10-CM | POA: Insufficient documentation

## 2019-08-07 DIAGNOSIS — H25811 Combined forms of age-related cataract, right eye: Secondary | ICD-10-CM | POA: Diagnosis not present

## 2019-08-07 DIAGNOSIS — Z9842 Cataract extraction status, left eye: Secondary | ICD-10-CM | POA: Insufficient documentation

## 2019-08-07 HISTORY — PX: CATARACT EXTRACTION W/PHACO: SHX586

## 2019-08-07 SURGERY — PHACOEMULSIFICATION, CATARACT, WITH IOL INSERTION
Anesthesia: Monitor Anesthesia Care | Site: Eye | Laterality: Right

## 2019-08-07 MED ORDER — TETRACAINE HCL 0.5 % OP SOLN
1.0000 [drp] | OPHTHALMIC | Status: DC | PRN
  Administered 2019-08-07 (×3): 1 [drp] via OPHTHALMIC

## 2019-08-07 MED ORDER — ACETAMINOPHEN 325 MG PO TABS: 325.0000 mg | ORAL_TABLET | ORAL | Status: DC | PRN

## 2019-08-07 MED ORDER — MOXIFLOXACIN HCL 0.5 % OP SOLN
1.0000 [drp] | OPHTHALMIC | Status: DC | PRN
  Administered 2019-08-07 (×3): 1 [drp] via OPHTHALMIC

## 2019-08-07 MED ORDER — NA CHONDROIT SULF-NA HYALURON 40-30 MG/ML IO SOLN
INTRAOCULAR | Status: DC | PRN
  Administered 2019-08-07: 0.5 mL via INTRAOCULAR

## 2019-08-07 MED ORDER — LACTATED RINGERS IV SOLN: INTRAVENOUS | Status: DC

## 2019-08-07 MED ORDER — ACETAMINOPHEN 160 MG/5ML PO SOLN: 325.0000 mg | ORAL | Status: DC | PRN

## 2019-08-07 MED ORDER — ONDANSETRON HCL 4 MG/2ML IJ SOLN: 4.0000 mg | Freq: Once | INTRAMUSCULAR | Status: DC | PRN

## 2019-08-07 MED ORDER — NA HYALUR & NA CHOND-NA HYALUR 0.4-0.35 ML IO KIT
PACK | INTRAOCULAR | Status: DC | PRN
  Administered 2019-08-07: 1 mL via INTRAOCULAR

## 2019-08-07 MED ORDER — MIDAZOLAM HCL 2 MG/2ML IJ SOLN
INTRAMUSCULAR | Status: DC | PRN
  Administered 2019-08-07: 2 mg via INTRAVENOUS

## 2019-08-07 MED ORDER — ARMC OPHTHALMIC DILATING DROPS
1.0000 "application " | OPHTHALMIC | Status: DC | PRN
  Administered 2019-08-07 (×3): 1 via OPHTHALMIC

## 2019-08-07 MED ORDER — LIDOCAINE HCL (PF) 2 % IJ SOLN
INTRAOCULAR | Status: DC | PRN
  Administered 2019-08-07: 1 mL

## 2019-08-07 MED ORDER — FENTANYL CITRATE (PF) 100 MCG/2ML IJ SOLN
INTRAMUSCULAR | Status: DC | PRN
  Administered 2019-08-07: 50 ug via INTRAVENOUS

## 2019-08-07 MED ORDER — BRIMONIDINE TARTRATE-TIMOLOL 0.2-0.5 % OP SOLN
OPHTHALMIC | Status: DC | PRN
  Administered 2019-08-07: 1 [drp] via OPHTHALMIC

## 2019-08-07 MED ORDER — EPINEPHRINE PF 1 MG/ML IJ SOLN
INTRAOCULAR | Status: DC | PRN
  Administered 2019-08-07: 81 mL via OPHTHALMIC

## 2019-08-07 MED ORDER — CEFUROXIME OPHTHALMIC INJECTION 1 MG/0.1 ML
INJECTION | OPHTHALMIC | Status: DC | PRN
  Administered 2019-08-07: 0.1 mL via INTRACAMERAL

## 2019-08-07 SURGICAL SUPPLY — 30 items
BLADE DUAL KAHOOK SINGLE USE (BLADE) ×1 IMPLANT
CANNULA ANT/CHMB 27G (MISCELLANEOUS) ×1 IMPLANT
CANNULA ANT/CHMB 27GA (MISCELLANEOUS) ×2 IMPLANT
GLOVE SURG LX 7.5 STRW (GLOVE) ×2
GLOVE SURG LX STRL 7.5 STRW (GLOVE) ×1 IMPLANT
GLOVE SURG TRIUMPH 8.0 PF LTX (GLOVE) ×2 IMPLANT
GOWN STRL REUS W/ TWL LRG LVL3 (GOWN DISPOSABLE) ×2 IMPLANT
GOWN STRL REUS W/TWL LRG LVL3 (GOWN DISPOSABLE) ×4
ICLIP (OPHTHALMIC RELATED) ×1 IMPLANT
LENS IOL TECNIS EYHANCE 19.0 ×1 IMPLANT
MARKER SKIN DUAL TIP RULER LAB (MISCELLANEOUS) ×2 IMPLANT
NDL CAPSULORHEX 25GA (NEEDLE) ×1 IMPLANT
NDL FILTER BLUNT 18X1 1/2 (NEEDLE) ×2 IMPLANT
NDL RETROBULBAR .5 NSTRL (NEEDLE) IMPLANT
NEEDLE CAPSULORHEX 25GA (NEEDLE) ×2 IMPLANT
NEEDLE FILTER BLUNT 18X 1/2SAF (NEEDLE) ×2
NEEDLE FILTER BLUNT 18X1 1/2 (NEEDLE) ×2 IMPLANT
PACK CATARACT BRASINGTON (MISCELLANEOUS) ×2 IMPLANT
PACK EYE AFTER SURG (MISCELLANEOUS) ×2 IMPLANT
PACK OPTHALMIC (MISCELLANEOUS) ×2 IMPLANT
RING MALYGIN 7.0 (MISCELLANEOUS) IMPLANT
SOLUTION OPHTHALMIC SALT (MISCELLANEOUS) ×2 IMPLANT
SUT ETHILON 10-0 CS-B-6CS-B-6 (SUTURE)
SUT VICRYL  9 0 (SUTURE)
SUT VICRYL 9 0 (SUTURE) IMPLANT
SUTURE EHLN 10-0 CS-B-6CS-B-6 (SUTURE) IMPLANT
SYR 3ML LL SCALE MARK (SYRINGE) ×4 IMPLANT
SYR TB 1ML LUER SLIP (SYRINGE) ×2 IMPLANT
WATER STERILE IRR 250ML POUR (IV SOLUTION) ×2 IMPLANT
WIPE NON LINTING 3.25X3.25 (MISCELLANEOUS) ×2 IMPLANT

## 2019-08-07 NOTE — Transfer of Care (Signed)
Immediate Anesthesia Transfer of Care Note  Patient: Theresa Hall  Procedure(s) Performed: CATARACT EXTRACTION PHACO AND INTRAOCULAR LENS PLACEMENT (IOC) RIGHT [place] DUAL BLADE GONIOTOMY (Right Eye)  Patient Location: PACU  Anesthesia Type: MAC  Level of Consciousness: awake, alert  and patient cooperative  Airway and Oxygen Therapy: Patient Spontanous Breathing and Patient connected to supplemental oxygen  Post-op Assessment: Post-op Vital signs reviewed, Patient's Cardiovascular Status Stable, Respiratory Function Stable, Patent Airway and No signs of Nausea or vomiting  Post-op Vital Signs: Reviewed and stable  Complications: No complications documented.

## 2019-08-07 NOTE — Anesthesia Preprocedure Evaluation (Signed)
Anesthesia Evaluation  Patient identified by MRN, date of birth, ID band Patient awake    Reviewed: Allergy & Precautions, H&P , NPO status , Patient's Chart, lab work & pertinent test results, reviewed documented beta blocker date and time   History of Anesthesia Complications (+) PONV and history of anesthetic complications  Airway Mallampati: II  TM Distance: >3 FB Neck ROM: full    Dental no notable dental hx.    Pulmonary neg pulmonary ROS,    Pulmonary exam normal breath sounds clear to auscultation       Cardiovascular Exercise Tolerance: Good hypertension, negative cardio ROS   Rhythm:regular Rate:Normal     Neuro/Psych  Headaches, negative psych ROS   GI/Hepatic Neg liver ROS, GERD  ,  Endo/Other  Hypothyroidism   Renal/GU Renal disease (stage 3)  negative genitourinary   Musculoskeletal   Abdominal   Peds  Hematology negative hematology ROS (+)   Anesthesia Other Findings   Reproductive/Obstetrics negative OB ROS                             Anesthesia Physical Anesthesia Plan  ASA: II  Anesthesia Plan: MAC   Post-op Pain Management:    Induction:   PONV Risk Score and Plan: 3 and Treatment may vary due to age or medical condition  Airway Management Planned:   Additional Equipment:   Intra-op Plan:   Post-operative Plan:   Informed Consent: I have reviewed the patients History and Physical, chart, labs and discussed the procedure including the risks, benefits and alternatives for the proposed anesthesia with the patient or authorized representative who has indicated his/her understanding and acceptance.     Dental Advisory Given  Plan Discussed with: CRNA  Anesthesia Plan Comments:         Anesthesia Quick Evaluation

## 2019-08-07 NOTE — Op Note (Signed)
PREOPERATIVE DIAGNOSIS:  Nuclear sclerotic cataract  right eye. 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 Item Serial No. Manufacturer Lot No. LRB No. Used Action  TECNIS EYHANCE IOL Intraocular Lens  [id number] Wynetta Emery [employer]  Right 1 Implanted    SURGEON:  Wyonia Hough, MD   ANESTHESIA:  Topical with tetracaine drops augmented with 1% preservative-free intracameral lidocaine.    COMPLICATIONS:  None.   DESCRIPTION OF PROCEDURE:  The patient was identified in the holding room and transported to the operating room and placed in the supine position under the operating microscope.  The right eye was identified as the operative eye and it was prepped and draped in the usual sterile ophthalmic fashion.   A 1 millimeter clear-corneal paracentesis was made at the 12:00 position.  0.5 ml of preservative-free 1% lidocaine was injected into the anterior chamber.  The anterior chamber was filled with Viscoat viscoelastic.  A 2.4 millimeter keratome was used to make a near-clear corneal incision at the 9:00 position. The microscope was adjusted and a gonioprism was used to visulaize the trabecular meshwork.  The [facility] Dual Blade was advanced across the anterior chamber under viscoelastic.  The blade was used to mark the trabecular meshwork at the 1:30 position.  The blade was placed two clock hours clockwise into the meshwork.  Proper postioning was confirmed.  The blade ws passed counterclockwise  through the meshwork to excise approximately two to three clock-hours of trabecular meshwork.   A curvilinear capsulorrhexis was made with a cystotome and capsulorrhexis forceps.  Balanced salt solution was used to hydrodissect and hydrodelineate the nucleus.   Phacoemulsification was then used in stop and chop fashion to remove the lens nucleus and epinucleus.  The remaining cortex was then removed using the irrigation and aspiration handpiece. Provisc was then placed into the capsular bag to distend it for lens placement.  A lens was then injected into the capsular bag.  The remaining viscoelastic was aspirated.   Wounds were hydrated with balanced salt solution.  The anterior chamber was inflated to a physiologic pressure with balanced salt solution.  No wound leaks were noted. Cefuroxime 0.1 ml of a 10mg /ml solution was injected into the anterior chamber for a dose of 1 mg of intracameral antibiotic at the completion of the case.  The patient was taken to the recovery room in stable condition without complications of anesthesia or surgery.

## 2019-08-07 NOTE — Anesthesia Procedure Notes (Signed)
Procedure Name: [place] Performed by: Jeannene Patella, CRNA Pre-anesthesia Checklist: Patient identified, Emergency Drugs available, Suction available, Timeout performed and Patient being monitored Patient Re-evaluated:Patient Re-evaluated prior to induction Oxygen Delivery Method: Nasal cannula Placement Confirmation: positive ETCO2

## 2019-08-07 NOTE — Anesthesia Postprocedure Evaluation (Signed)
Anesthesia Post Note  Patient: Theresa Hall  Procedure(s) Performed: CATARACT EXTRACTION PHACO AND INTRAOCULAR LENS PLACEMENT (IOC) RIGHT [place] DUAL BLADE GONIOTOMY (Right Eye)     Patient location during evaluation: PACU Anesthesia Type: MAC Level of consciousness: awake and alert Pain management: pain level controlled Vital Signs Assessment: post-procedure vital signs reviewed and stable Respiratory status: spontaneous breathing, nonlabored ventilation, respiratory function stable and patient connected to nasal cannula oxygen Cardiovascular status: stable and blood pressure returned to baseline Postop Assessment: no apparent nausea or vomiting Anesthetic complications: no   No complications documented.  Alisa Graff

## 2019-08-07 NOTE — H&P (Signed)

## 2019-08-08 ENCOUNTER — Encounter: Payer: Self-pay | Admitting: Ophthalmology

## 2019-08-15 ENCOUNTER — Other Ambulatory Visit: Payer: Self-pay | Admitting: Family Medicine

## 2019-08-15 DIAGNOSIS — K219 Gastro-esophageal reflux disease without esophagitis: Secondary | ICD-10-CM

## 2019-08-19 ENCOUNTER — Other Ambulatory Visit: Payer: Self-pay

## 2019-08-19 ENCOUNTER — Encounter: Payer: Self-pay | Admitting: Family Medicine

## 2019-08-19 ENCOUNTER — Ambulatory Visit (INDEPENDENT_AMBULATORY_CARE_PROVIDER_SITE_OTHER): Payer: Medicare HMO | Admitting: Family Medicine

## 2019-08-19 VITALS — BP 120/60 | HR 114 | Temp 98.5°F | Resp 16 | Ht 64.0 in | Wt 158.1 lb

## 2019-08-19 DIAGNOSIS — Z79899 Other long term (current) drug therapy: Secondary | ICD-10-CM | POA: Diagnosis not present

## 2019-08-19 DIAGNOSIS — E785 Hyperlipidemia, unspecified: Secondary | ICD-10-CM

## 2019-08-19 DIAGNOSIS — E039 Hypothyroidism, unspecified: Secondary | ICD-10-CM | POA: Diagnosis not present

## 2019-08-19 DIAGNOSIS — N1831 Chronic kidney disease, stage 3a: Secondary | ICD-10-CM

## 2019-08-19 DIAGNOSIS — D2272 Melanocytic nevi of left lower limb, including hip: Secondary | ICD-10-CM | POA: Diagnosis not present

## 2019-08-19 DIAGNOSIS — D2262 Melanocytic nevi of left upper limb, including shoulder: Secondary | ICD-10-CM | POA: Diagnosis not present

## 2019-08-19 DIAGNOSIS — I129 Hypertensive chronic kidney disease with stage 1 through stage 4 chronic kidney disease, or unspecified chronic kidney disease: Secondary | ICD-10-CM | POA: Diagnosis not present

## 2019-08-19 DIAGNOSIS — N183 Chronic kidney disease, stage 3 unspecified: Secondary | ICD-10-CM

## 2019-08-19 DIAGNOSIS — I10 Essential (primary) hypertension: Secondary | ICD-10-CM

## 2019-08-19 DIAGNOSIS — K219 Gastro-esophageal reflux disease without esophagitis: Secondary | ICD-10-CM

## 2019-08-19 DIAGNOSIS — L821 Other seborrheic keratosis: Secondary | ICD-10-CM | POA: Diagnosis not present

## 2019-08-19 DIAGNOSIS — Z85820 Personal history of malignant melanoma of skin: Secondary | ICD-10-CM | POA: Diagnosis not present

## 2019-08-19 DIAGNOSIS — K760 Fatty (change of) liver, not elsewhere classified: Secondary | ICD-10-CM

## 2019-08-19 DIAGNOSIS — L309 Dermatitis, unspecified: Secondary | ICD-10-CM | POA: Diagnosis not present

## 2019-08-19 DIAGNOSIS — D2271 Melanocytic nevi of right lower limb, including hip: Secondary | ICD-10-CM | POA: Diagnosis not present

## 2019-08-19 DIAGNOSIS — Z85828 Personal history of other malignant neoplasm of skin: Secondary | ICD-10-CM | POA: Diagnosis not present

## 2019-08-19 DIAGNOSIS — G43009 Migraine without aura, not intractable, without status migrainosus: Secondary | ICD-10-CM | POA: Diagnosis not present

## 2019-08-19 DIAGNOSIS — Z8719 Personal history of other diseases of the digestive system: Secondary | ICD-10-CM

## 2019-08-19 LAB — HEPATIC FUNCTION PANEL
AG Ratio: 1.9 (calc) (ref 1.0–2.5)
ALT: 29 U/L (ref 6–29)
AST: 20 U/L (ref 10–35)
Albumin: 4.7 g/dL (ref 3.6–5.1)
Alkaline phosphatase (APISO): 51 U/L (ref 37–153)
Bilirubin, Direct: 0.1 mg/dL (ref 0.0–0.2)
Globulin: 2.5 g/dL (calc) (ref 1.9–3.7)
Indirect Bilirubin: 0.3 mg/dL (calc) (ref 0.2–1.2)
Total Bilirubin: 0.4 mg/dL (ref 0.2–1.2)
Total Protein: 7.2 g/dL (ref 6.1–8.1)

## 2019-08-19 LAB — TSH: TSH: 0.11 mIU/L — ABNORMAL LOW (ref 0.40–4.50)

## 2019-08-19 MED ORDER — IRBESARTAN 75 MG PO TABS: 75.0000 mg | ORAL_TABLET | Freq: Every day | ORAL | 1 refills | Status: DC

## 2019-08-19 MED ORDER — TOPIRAMATE 50 MG PO TABS: 50.0000 mg | ORAL_TABLET | Freq: Every evening | ORAL | 1 refills | Status: DC

## 2019-08-19 NOTE — Progress Notes (Signed)
Name: Theresa Hall   MRN: [id number]    DOB: 25-Jan-1940   Date:08/19/2019       Progress Note  Subjective  Chief Complaint  Chief Complaint  Patient presents with  . Gastroesophageal Reflux  . Migraine  . Hypertension  . Hypothyroidism  . Hyperlipidemia    HPI  HTN and she has a history of CKI stage III: reviewed labs done by Dr. Abigail Butts, GFR is stable.She is on low dose of Avaprono chest pain or palpitation. Denies pruritus or change in urinary frequency .Pth was back to normal level.   Hypothyroidism: last TSH was a little low, she taking medication in am, taking one daily and one extra half on Tuesday and we will recheck level today. No hair loss, dry skin, denies dysphagia.   Migraine headaches: she had it for many years,she is doing well on Topamax at night. No recent episodes. Now usually triggered by weather changesShe needs refill of Topamax   Hyperlipidemia: taking medication and denies side effects,reviewed labs done  Early 2021. She denies myalgias, we will check liver function test, repeat lipid panel yearly   Osteopenia: discussed results, discussed high calcium and vitamin D diet and shehas been taking low dose fosamax without side effects.Reviewed bone density done 10/2018 osteopenia stable with mild improvement Continue medication   GERD/Hiatal hernia: she states symptoms are intermittent,she eats dinner around 7 pm , she only occasionally has indigestion, she is taking famotidine daily and seems to be controlling symptoms.   Fatty liver: found on CT abdomen and pelvis, abdominal cramping has resolved, resolved since last visit. Colonoscopy is up to date . She took MIralax and did well, she stopped taking medication now and is doing well, advised to resume miralax    Patient Active Problem List   Diagnosis Date Noted  . Benign hypertensive kidney disease with chronic kidney disease 07/22/2019  . BCC (basal cell carcinoma of skin) 10/16/2017  .  Secondary hyperparathyroidism of renal origin ([place]) 07/18/2017  . Osteopenia 08/15/2016  . Closed displaced comminuted fracture of right patella 08/20/2015  . Reflux esophagitis 06/23/2014  . HLD (hyperlipidemia) 06/23/2014  . Palpitations 06/23/2014  . Chronic kidney disease (CKD), stage III (moderate) 08/24/2009  . History of diverticulitis 05/14/2008  . Adult hypothyroidism 12/04/2006  . Migraine without aura 08/02/2006  . 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 APPENDECTOMY    . BASAL CELL CARCINOMA EXCISION    . BASAL CELL CARCINOMA EXCISION  2014   nasal  . CATARACT EXTRACTION W/PHACO Left 07/03/2019   Procedure: CATARACT EXTRACTION PHACO AND INTRAOCULAR LENS PLACEMENT ([facility]) LEFT KAHOOK DUAL BLADE GONIOTOMY 9.84 01:14.5 13.2%;  Surgeon: Leandrew Koyanagi, MD;  Location: [facility];  Service: Ophthalmology;  Laterality: Left;  . CATARACT EXTRACTION W/PHACO Right 08/07/2019   Procedure: CATARACT EXTRACTION PHACO AND INTRAOCULAR LENS PLACEMENT ([facility]) RIGHT KAHOOK DUAL BLADE GONIOTOMY;  Surgeon: Leandrew Koyanagi, MD;  Location: [facility];  Service: Ophthalmology;  Laterality: Right;  6.46 0:59.4 10.9%  . CHOLECYSTECTOMY    . COLONOSCOPY    . ORIF PATELLA Right 08/15/2015   Procedure: OPEN REDUCTION INTERNAL (ORIF) FIXATION PATELLA  and application wound vac;  Surgeon: Corky Mull, MD;  Location: [facility];  Service: Orthopedics;  Laterality: Right;  . TONSILLECTOMY    . VAGINAL HYSTERECTOMY      Family History  Problem Relation Age of Onset  . Heart attack Father  x2  . Heart disease Sister   . Kidney disease Sister   . Diabetes Sister   . Heart attack Brother   . Diabetes Brother   . Heart attack Brother   . Breast cancer Neg Hx     Social History   Tobacco Use  . Smoking status: Never Smoker  . Smokeless tobacco: Never Used  . Tobacco comment: smoking cessation materials not  required  Substance Use Topics  . Alcohol use: No     Current Outpatient Medications:  .  alendronate (FOSAMAX) 35 MG tablet, TAKE 1 TABLET BY MOUTH EVERY 7 DAYS. TAKE WITH WATER ON AN EMPTY STOMACH., Disp: 12 tablet, Rfl: 1 .  ASPIRIN LOW DOSE 81 MG tablet, Take 1 tablet by mouth daily. , Disp: , Rfl:  .  azelastine (ASTELIN) 0.1 % nasal spray, PLACE 2 SPRAYS INTO BOTH NOSTRILS 2 (TWO) TIMES DAILY. USE IN EACH NOSTRIL AS DIRECTED, Disp: 30 mL, Rfl: 4 .  Calcium Polycarbophil (FIBERCON PO), Take 1 tablet by mouth at bedtime. , Disp: , Rfl:  .  CALCIUM-MAGNESIUM-ZINC PO, Take by mouth., Disp: , Rfl:  .  cholecalciferol (VITAMIN D) 1000 units tablet, Take 1 capsule by mouth daily., Disp: , Rfl:  .  famotidine (PEPCID) 40 MG tablet, TAKE 1 TABLET BY MOUTH EVERY DAY, Disp: 90 tablet, Rfl: 1 .  Fish Oil-Cholecalciferol (FISH OIL + D3) 1200-1000 MG-UNIT CAPS, Take 1 capsule by mouth daily., Disp: , Rfl:  .  irbesartan (AVAPRO) 75 MG tablet, Take 1 tablet (75 mg total) by mouth daily., Disp: 90 tablet, Rfl: 1 .  Multiple Vitamin (MULTIVITAMIN) tablet, Take 1 tablet by mouth daily., Disp: , Rfl:  .  pravastatin (PRAVACHOL) 80 MG tablet, TAKE 1 TABLET BY MOUTH EVERY DAY, Disp: 90 tablet, Rfl: 3 .  SYNTHROID 88 MCG tablet, TAKE 1 TABLET BY MOUTH DAILY. TAKE 1 AND 1/2 TABLETS ON TUESDAYS AND THURSDAYS (Patient taking differently: TAKE 1 TABLET BY MOUTH DAILY. TAKE 1 AND 1/2 TABLETS ON TUESDAYS), Disp: 112 tablet, Rfl: 1 .  topiramate (TOPAMAX) 50 MG tablet, Take 1 tablet (50 mg total) by mouth every evening., Disp: 90 tablet, Rfl: 1 .  vitamin C (ASCORBIC ACID) 500 MG tablet, Take 500 mg by mouth daily., Disp: , Rfl:  .  latanoprost (XALATAN) 0.005 % ophthalmic solution, Place 1 drop into both eyes at bedtime. (Patient not taking: Reported on 08/19/2019), Disp: , Rfl: 5 .  timolol (TIMOPTIC) 0.5 % ophthalmic solution, Place 1 drop into both eyes every morning. (Patient not taking: Reported on 08/19/2019),  Disp: , Rfl:   Allergies  Allergen Reactions  . Codeine Other (See Comments)    Patient had insomnia and nausea  . Oxycodone Other (See Comments)    Made her "Loopy"    I personally reviewed active problem list, medication list, allergies, family history, social history, health maintenance with the patient/caregiver today.   ROS  Constitutional: Negative for fever or weight change.  Respiratory: Negative for cough and shortness of breath.   Cardiovascular: Negative for chest pain or palpitations.  Gastrointestinal: Negative for abdominal pain, no bowel changes.  Musculoskeletal: Negative for gait problem or joint swelling.  Skin: Negative for rash.  Neurological: Negative for dizziness or headache.  No other specific complaints in a complete review of systems (except as listed in HPI above).  Objective  Vitals:   08/19/19 1109  BP: 120/60  Pulse: (!) 114  Resp: 16  Temp: 98.5 F (36.9 C)  TempSrc: Temporal  SpO2: 98%  Weight: 158 lb 1.6 oz (71.7 kg)  Height: 5\' 4"  (1.626 m)    Body mass index is 27.14 kg/m.  Physical Exam  Constitutional: Patient appears well-developed and well-nourished. Overweight.  No distress.  HEENT: head atraumatic, normocephalic, pupils equal and reactive to light, eck supple Cardiovascular: Normal rate, regular rhythm and normal heart sounds.  No murmur heard. No BLE edema. Pulmonary/Chest: Effort normal and breath sounds normal. No respiratory distress. Abdominal: Soft.  There is no tenderness. Psychiatric: Patient has a normal mood and affect. behavior is normal. Judgment and thought content normal.  Recent Results (from the past 2160 hour(s))  SARS CORONAVIRUS 2 (TAT 6-24 HRS) Nasopharyngeal Nasopharyngeal Swab     Status: None   Collection Time: 07/01/19  2:20 PM   Specimen: Nasopharyngeal Swab  Result Value Ref Range   SARS Coronavirus 2 NEGATIVE NEGATIVE    Comment: (NOTE) SARS-CoV-2 target nucleic acids are NOT DETECTED.  The  SARS-CoV-2 RNA is generally detectable in upper and lower respiratory specimens during the acute phase of infection. Negative results do not preclude SARS-CoV-2 infection, do not rule out co-infections with other pathogens, and should not be used as the sole basis for treatment or other patient management decisions. Negative results must be combined with clinical observations, patient history, and epidemiological information. The expected result is Negative.  Fact Sheet for Patients: SugarRoll.be  Fact Sheet for Healthcare Providers: https://www.woods-mathews.com/  This test is not yet approved or cleared by the [place] FDA and  has been authorized for detection and/or diagnosis of SARS-CoV-2 by FDA under an Emergency Use Authorization (EUA). This EUA will remain  in effect (meaning this test can be used) for the duration of the COVID-19 declaration under Se ction 564(b)(1) of the Act, 21 U.S.C. section 360bbb-3(b)(1), unless the authorization is terminated or revoked sooner.  Performed at [facility], [place] [address] [place], [place] [address]   SARS CORONAVIRUS 2 (TAT 6-24 HRS) Nasopharyngeal Nasopharyngeal Swab     Status: None   Collection Time: 08/05/19  2:07 PM   Specimen: Nasopharyngeal Swab  Result Value Ref Range   SARS Coronavirus 2 NEGATIVE NEGATIVE    Comment: (NOTE) SARS-CoV-2 target nucleic acids are NOT DETECTED.  The SARS-CoV-2 RNA is generally detectable in upper and lower respiratory specimens during the acute phase of infection. Negative results do not preclude SARS-CoV-2 infection, do not rule out co-infections with other pathogens, and should not be used as the sole basis for treatment or other patient management decisions. Negative results must be combined with clinical observations, patient history, and epidemiological information. The expected result is Negative.  Fact Sheet for  Patients: SugarRoll.be  Fact Sheet for Healthcare Providers: https://www.woods-mathews.com/  This test is not yet approved or cleared by the [place] FDA and  has been authorized for detection and/or diagnosis of SARS-CoV-2 by FDA under an Emergency Use Authorization (EUA). This EUA will remain  in effect (meaning this test can be used) for the duration of the COVID-19 declaration under Se ction 564(b)(1) of the Act, 21 U.S.C. section 360bbb-3(b)(1), unless the authorization is terminated or revoked sooner.  Performed at [facility], [place] [address] [place], [place] [address]      PHQ2/9: Depression screen [facility] 2/9 08/19/2019 07/23/2019 03/25/2019 02/18/2019 08/13/2018  Decreased Interest 0 0 0 0 0  Down, Depressed, Hopeless 0 0 0 0 0  PHQ - 2 Score 0 0 0 0 0  Altered sleeping 0 - 0  0 0  Tired, decreased energy 0 - 1 0 0  Change in appetite 0 - 0 0 0  Feeling bad or failure about yourself  0 - 0 0 0  Trouble concentrating 0 - 0 0 0  Moving slowly or fidgety/restless 0 - 0 0 0  Suicidal thoughts - - 0 0 0  PHQ-9 Score 0 - 1 0 0  Difficult doing work/chores - - Not difficult at all - -  Some recent data might be hidden    phq 9 is negative   Fall Risk: Fall Risk  08/19/2019 07/23/2019 03/25/2019 02/18/2019 08/13/2018  Falls in the past year? 0 0 0 0 0  Comment - - - - -  Number falls in past yr: 0 0 0 0 0  Injury with Fall? 0 0 0 0 0  Comment - - - - -  Risk for fall due to : - No Fall Risks - - -  Risk for fall due to: Comment - - - - -  Follow up - Falls prevention discussed - - -     Functional Status Survey: Is the patient deaf or have difficulty hearing?: No Does the patient have difficulty seeing, even when wearing glasses/contacts?: No Does the patient have difficulty concentrating, remembering, or making decisions?: No Does the patient have difficulty walking or climbing stairs?: No Does the patient have difficulty  dressing or bathing?: No Does the patient have difficulty doing errands alone such as visiting a doctor's office or shopping?: No    Assessment & Plan  1. Migraine without aura and without status migrainosus, not intractable  - topiramate (TOPAMAX) 50 MG tablet; Take 1 tablet (50 mg total) by mouth every evening.  Dispense: 90 tablet; Refill: 1  2. H/O diverticulitis of colon   3. Adult hypothyroidism  - TSH  4. Dyslipidemia  Continue statin therapy   5. Hypertension, benign  - irbesartan (AVAPRO) 75 MG tablet; Take 1 tablet (75 mg total) by mouth daily.  Dispense: 90 tablet; Refill: 1  6. GERD without esophagitis   7. Stage 3a chronic kidney disease  - irbesartan (AVAPRO) 75 MG tablet; Take 1 tablet (75 mg total) by mouth daily.  Dispense: 90 tablet; Refill: 1  8. Long-term use of high-risk medication  - Hepatic function panel  9. Fatty liver  Found on CT   10. Benign hypertension with chronic kidney disease, stage III  - irbesartan (AVAPRO) 75 MG tablet; Take 1 tablet (75 mg total) by mouth daily.  Dispense: 90 tablet; Refill: 1

## 2019-08-20 ENCOUNTER — Other Ambulatory Visit: Payer: Self-pay | Admitting: Family Medicine

## 2019-08-20 DIAGNOSIS — E039 Hypothyroidism, unspecified: Secondary | ICD-10-CM

## 2019-08-20 MED ORDER — LEVOTHYROXINE SODIUM 88 MCG PO TABS: 88.0000 ug | ORAL_TABLET | Freq: Every day | ORAL | 0 refills | Status: DC

## 2019-09-25 ENCOUNTER — Telehealth: Payer: Self-pay

## 2019-09-25 NOTE — Telephone Encounter (Signed)
  Chronic Care Management   Outreach Note  09/25/2019 Name: Theresa Hall MRN: [id number] DOB: 03-22-1940  Primary Care Provider: Steele Sizer, MD Reason for referral : Chronic Care Management   An unsuccessful telephone outreach was attempted today. Theresa Hall was referred to the case management team for assistance with care management and care coordination.    A HIPAA compliant voice message was left today requesting a return call.    PLAN The care management team will reach out to Theresa Hall again within the next two weeks.   Theresa Hall [facility] Care Management [facility] [phone]

## 2019-10-03 DIAGNOSIS — Z961 Presence of intraocular lens: Secondary | ICD-10-CM | POA: Diagnosis not present

## 2019-10-15 DIAGNOSIS — E039 Hypothyroidism, unspecified: Secondary | ICD-10-CM | POA: Diagnosis not present

## 2019-10-15 DIAGNOSIS — R69 Illness, unspecified: Secondary | ICD-10-CM | POA: Diagnosis not present

## 2019-10-15 LAB — TSH: TSH: 0.39 mIU/L — ABNORMAL LOW (ref 0.40–4.50)

## 2019-10-17 ENCOUNTER — Other Ambulatory Visit: Payer: Self-pay | Admitting: Family Medicine

## 2019-10-17 MED ORDER — LEVOTHYROXINE SODIUM 88 MCG PO TABS: 88.0000 ug | ORAL_TABLET | Freq: Every day | ORAL | 0 refills | Status: DC

## 2019-10-18 ENCOUNTER — Telehealth: Payer: Self-pay

## 2019-10-18 NOTE — Telephone Encounter (Signed)
°  Chronic Care Management   Outreach Note  10/18/2019 Name: GABRYELLA MURFIN MRN: [id number] DOB: December 15, 1940  Primary Care Provider: Steele Sizer, MD Reason for referral : Chronic Care Management   An unsuccessful telephone outreach was attempted today. Ms. Vandenheuvel was referred to the case management team for assistance with care management and care coordination.     PLAN A HIPAA compliant voice message was left today requesting a return call.    Cristy Friedlander [facility] Care Management [facility] [phone]

## 2019-10-29 ENCOUNTER — Ambulatory Visit: Payer: Medicare HMO | Attending: Internal Medicine

## 2019-10-29 DIAGNOSIS — Z23 Encounter for immunization: Secondary | ICD-10-CM

## 2019-10-29 NOTE — Progress Notes (Signed)
° °  Covid-19 Vaccination Clinic  Name:  THAI HEMRICK    MRN: [id number] DOB: 1940-08-26  10/29/2019  Ms. Ouk was observed post Covid-19 immunization for 15 minutes without incident. She was provided with Vaccine Information Sheet and instruction to access the V-Safe system.   Ms. Coldren was instructed to call 911 with any severe reactions post vaccine:  Difficulty breathing   Swelling of face and throat   A fast heartbeat   A bad rash all over body   Dizziness and weakness

## 2019-10-30 ENCOUNTER — Other Ambulatory Visit: Payer: Self-pay | Admitting: Internal Medicine

## 2019-12-17 ENCOUNTER — Other Ambulatory Visit: Payer: Self-pay

## 2019-12-17 DIAGNOSIS — E039 Hypothyroidism, unspecified: Secondary | ICD-10-CM

## 2019-12-18 ENCOUNTER — Other Ambulatory Visit: Payer: Self-pay | Admitting: Family Medicine

## 2019-12-18 ENCOUNTER — Telehealth: Payer: Self-pay | Admitting: Family Medicine

## 2019-12-18 LAB — TSH: TSH: 0.39 mIU/L — ABNORMAL LOW (ref 0.40–4.50)

## 2019-12-18 MED ORDER — SYNTHROID 75 MCG PO TABS: 75.0000 ug | ORAL_TABLET | Freq: Every day | ORAL | 0 refills | Status: DC

## 2019-12-18 MED ORDER — LEVOTHYROXINE SODIUM 75 MCG PO TABS: 75.0000 ug | ORAL_TABLET | Freq: Every day | ORAL | 0 refills | Status: DC

## 2019-12-18 NOTE — Telephone Encounter (Signed)
Pt is calling to let dr Ancil Boozer know dr Lucita Lora told her to never take generic synthroid. Pt would like to have name brand sent to  [facility] on [address] in [place]

## 2020-01-06 ENCOUNTER — Ambulatory Visit: Payer: Self-pay

## 2020-01-06 NOTE — Chronic Care Management (AMB) (Signed)
  Chronic Care Management   Outreach Note  01/06/2020 Name: TAELER WINNING MRN: [id number] DOB: Jan 17, 1940  Primary Care Provider: Steele Sizer, MD Reason for referral : Chronic Care Management   Ms. Slomski was referred to the care management team for assistance with chronic care management and care coordination. Her primary care provider will be notified of our unsuccessful attempts to maintain contact. The care management team will gladly outreach at any time in the future if she is interested in receiving assistance.   PLAN The care management team will gladly follow up with Ms. Donofrio after the primary care provider has a conversation with her regarding recommendation for care management engagement and subsequent re-referral for care management services.    Cristy Friedlander [facility] Care Management [facility] [phone]

## 2020-01-31 DIAGNOSIS — H401111 Primary open-angle glaucoma, right eye, mild stage: Secondary | ICD-10-CM | POA: Diagnosis not present

## 2020-02-04 ENCOUNTER — Other Ambulatory Visit: Payer: Self-pay | Admitting: Family Medicine

## 2020-02-04 DIAGNOSIS — I129 Hypertensive chronic kidney disease with stage 1 through stage 4 chronic kidney disease, or unspecified chronic kidney disease: Secondary | ICD-10-CM | POA: Diagnosis not present

## 2020-02-04 DIAGNOSIS — Z1231 Encounter for screening mammogram for malignant neoplasm of breast: Secondary | ICD-10-CM

## 2020-02-04 DIAGNOSIS — I10 Essential (primary) hypertension: Secondary | ICD-10-CM

## 2020-02-04 DIAGNOSIS — N1831 Chronic kidney disease, stage 3a: Secondary | ICD-10-CM | POA: Diagnosis not present

## 2020-02-04 DIAGNOSIS — M8580 Other specified disorders of bone density and structure, unspecified site: Secondary | ICD-10-CM

## 2020-02-04 DIAGNOSIS — N2581 Secondary hyperparathyroidism of renal origin: Secondary | ICD-10-CM | POA: Diagnosis not present

## 2020-02-04 DIAGNOSIS — K219 Gastro-esophageal reflux disease without esophagitis: Secondary | ICD-10-CM

## 2020-02-07 ENCOUNTER — Other Ambulatory Visit: Payer: Self-pay

## 2020-02-07 DIAGNOSIS — E039 Hypothyroidism, unspecified: Secondary | ICD-10-CM | POA: Diagnosis not present

## 2020-02-07 LAB — TSH: TSH: 2.89 mIU/L (ref 0.40–4.50)

## 2020-02-08 ENCOUNTER — Other Ambulatory Visit: Payer: Self-pay | Admitting: Family Medicine

## 2020-02-10 DIAGNOSIS — H401131 Primary open-angle glaucoma, bilateral, mild stage: Secondary | ICD-10-CM | POA: Diagnosis not present

## 2020-02-11 DIAGNOSIS — N2581 Secondary hyperparathyroidism of renal origin: Secondary | ICD-10-CM | POA: Diagnosis not present

## 2020-02-11 DIAGNOSIS — I129 Hypertensive chronic kidney disease with stage 1 through stage 4 chronic kidney disease, or unspecified chronic kidney disease: Secondary | ICD-10-CM | POA: Diagnosis not present

## 2020-02-11 DIAGNOSIS — N1831 Chronic kidney disease, stage 3a: Secondary | ICD-10-CM | POA: Diagnosis not present

## 2020-02-13 ENCOUNTER — Ambulatory Visit
Admission: RE | Admit: 2020-02-13 | Discharge: 2020-02-13 | Disposition: A | Discharge status: Home / Self Care | Payer: Medicare HMO | Source: Ambulatory Visit | Attending: Family Medicine | Admitting: Family Medicine

## 2020-02-13 ENCOUNTER — Other Ambulatory Visit: Payer: Self-pay

## 2020-02-13 DIAGNOSIS — Z1231 Encounter for screening mammogram for malignant neoplasm of breast: Secondary | ICD-10-CM

## 2020-02-17 NOTE — Progress Notes (Signed)
Name: Theresa Hall   MRN: [id number]    DOB: 04/01/1940   Date:02/19/2020       Progress Note  Subjective  Chief Complaint  Follow up   HPI   HTN and she has a history of CKI stage III: reviewed labs done by Dr. Abigail Butts, GFR is stable.She is on low dose of Avaprono chest pain , palpitation or dizziness.  Denies pruritus or change in urinary frequency .Labs done in January 2022 showed GFR of 41, parathyroid was up again at 69 , no anemia  Hypothyroidism: last TSH was at goal, recheck in about 4 weeks, continue current dose of 75 daily and skip Sundays'. She denies dry skin, change in bowel movement or dysphagia.   Migraine headaches: she had it for many years,she is doing well on Topamax at night. No recent episodes. Now usually triggered by weather changesShe does not want to stop taking Topamax yet   Hyperlipidemia: taking medication and denies side effects,reviewed labs done  Early 2021. She denies myalgias, we will recheck labs   Osteopenia: discussed results, discussed high calcium and vitamin D diet and shehas been taking low dose fosamax without side effects.Reviewed bone density done 10/2018 osteopenia stable with mild improvement Continue medication , we will recheck this Fall  GERD/Hiatal hernia: she states symptoms are intermittent,she eats dinner around 7 pm, smaller portions , she only occasionally has indigestion, she is taking famotidine daily and seems to be controlling symptoms. Occasionally takes Tums if needed   Fatty liver: found on CT abdomen and pelvis, abdominal cramping has resolved, resolved since last visit. Colonoscopy is up to date . She is no longer taking Miralax. We will recheck liver enzymes    Patient Active Problem List   Diagnosis Date Noted  . Benign hypertensive kidney disease with chronic kidney disease 07/22/2019  . BCC (basal cell carcinoma of skin) 10/16/2017  . Secondary hyperparathyroidism of renal origin ([place]) 07/18/2017  .  Osteopenia 08/15/2016  . Closed displaced comminuted fracture of right patella 08/20/2015  . Reflux esophagitis 06/23/2014  . HLD (hyperlipidemia) 06/23/2014  . Palpitations 06/23/2014  . Chronic kidney disease (CKD), stage III (moderate) ([place]) 08/24/2009  . History of diverticulitis 05/14/2008  . Adult hypothyroidism 12/04/2006  . Migraine without aura 08/02/2006  . 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 APPENDECTOMY    . BASAL CELL CARCINOMA EXCISION    . BASAL CELL CARCINOMA EXCISION  2014   nasal  . CATARACT EXTRACTION W/PHACO Left 07/03/2019   Procedure: CATARACT EXTRACTION PHACO AND INTRAOCULAR LENS PLACEMENT (IOC) LEFT KAHOOK DUAL BLADE GONIOTOMY 9.84 01:14.5 13.2%;  Surgeon: Leandrew Koyanagi, MD;  Location: [place];  Service: Ophthalmology;  Laterality: Left;  . CATARACT EXTRACTION W/PHACO Right 08/07/2019   Procedure: CATARACT EXTRACTION PHACO AND INTRAOCULAR LENS PLACEMENT ([place]) RIGHT KAHOOK DUAL BLADE GONIOTOMY;  Surgeon: Leandrew Koyanagi, MD;  Location: [place];  Service: Ophthalmology;  Laterality: Right;  6.46 0:59.4 10.9%  . CHOLECYSTECTOMY    . COLONOSCOPY    . ORIF PATELLA Right 08/15/2015   Procedure: OPEN REDUCTION INTERNAL (ORIF) FIXATION PATELLA  and application wound vac;  Surgeon: Corky Mull, MD;  Location: [facility];  Service: Orthopedics;  Laterality: Right;  . TONSILLECTOMY    . VAGINAL HYSTERECTOMY      Family History  Problem Relation Age of Onset  . Heart attack Father        x2  . Heart disease Sister   .  Kidney disease Sister   . Diabetes Sister   . Heart attack Brother   . Diabetes Brother   . Heart attack Brother   . Breast cancer Neg Hx     Social History   Tobacco Use  . Smoking status: Never Smoker  . Smokeless tobacco: Never Used  . Tobacco comment: smoking cessation materials not required  Substance Use Topics  . Alcohol use: No      Current Outpatient Medications:  .  alendronate (FOSAMAX) 35 MG tablet, TAKE 1 TABLET BY MOUTH EVERY 7 DAYS. TAKE WITH WATER ON AN EMPTY STOMACH., Disp: 12 tablet, Rfl: 1 .  azelastine (ASTELIN) 0.1 % nasal spray, PLACE 2 SPRAYS INTO BOTH NOSTRILS 2 (TWO) TIMES DAILY. USE IN EACH NOSTRIL AS DIRECTED, Disp: 30 mL, Rfl: 4 .  Calcium Polycarbophil (FIBERCON PO), Take 1 tablet by mouth at bedtime. , Disp: , Rfl:  .  cholecalciferol (VITAMIN D) 1000 units tablet, Take 1 capsule by mouth daily., Disp: , Rfl:  .  famotidine (PEPCID) 40 MG tablet, TAKE 1 TABLET BY MOUTH EVERY DAY, Disp: 90 tablet, Rfl: 1 .  Fish Oil-Cholecalciferol (FISH OIL + D3) 1200-1000 MG-UNIT CAPS, Take 1 capsule by mouth daily., Disp: , Rfl:  .  Multiple Vitamin (MULTIVITAMIN) tablet, Take 1 tablet by mouth daily., Disp: , Rfl:  .  pravastatin (PRAVACHOL) 80 MG tablet, TAKE 1 TABLET BY MOUTH EVERY DAY, Disp: 90 tablet, Rfl: 3 .  SYNTHROID 75 MCG tablet, Take 1 tablet (75 mcg total) by mouth daily before breakfast. One daily and skip Sundays, Disp: 90 tablet, Rfl: 0 .  timolol (TIMOPTIC) 0.5 % ophthalmic solution, Administer 1 drop into both eyes 1 (one) time each day in the morning, Disp: , Rfl:  .  topiramate (TOPAMAX) 50 MG tablet, Take 1 tablet (50 mg total) by mouth every evening., Disp: 90 tablet, Rfl: 1 .  vitamin C (ASCORBIC ACID) 500 MG tablet, Take 500 mg by mouth daily., Disp: , Rfl:  .  irbesartan (AVAPRO) 75 MG tablet, Take 1 tablet (75 mg total) by mouth daily., Disp: 90 tablet, Rfl: 1  Allergies  Allergen Reactions  . Codeine Other (See Comments)    Patient had insomnia and nausea  . Oxycodone Other (See Comments)    Made her "Loopy"    I personally reviewed active problem list, medication list, allergies, family history, social history, health maintenance with the patient/caregiver today.   ROS  Constitutional: Negative for fever or weight change.  Respiratory: Negative for cough and shortness of  breath.   Cardiovascular: Negative for chest pain or palpitations.  Gastrointestinal: Negative for abdominal pain, no bowel changes.  Musculoskeletal: Negative for gait problem or joint swelling.  Skin: Negative for rash.  Neurological: Negative for dizziness or headache.  No other specific complaints in a complete review of systems (except as listed in HPI above).   Objective  Vitals:   02/19/20 1332  BP: 116/64  Pulse: 97  Resp: 16  Temp: 98.2 F (36.8 C)  SpO2: 99%  Weight: 157 lb 3.2 oz (71.3 kg)  Height: 5\' 4"  (1.626 m)    Body mass index is 26.98 kg/m.  Physical Exam  Constitutional: Patient appears well-developed and well-nourished. Overweight.  No distress.  HEENT: head atraumatic, normocephalic, pupils equal and reactive to light, neck supple Cardiovascular: Normal rate, regular rhythm and normal heart sounds.  No murmur heard. No BLE edema. Pulmonary/Chest: Effort normal and breath sounds normal. No respiratory distress. Abdominal: Soft.  There  is no tenderness. Psychiatric: Patient has a normal mood and affect. behavior is normal. Judgment and thought content normal.  Recent Results (from the past 2160 hour(s))  TSH     Status: Abnormal   Collection Time: 12/17/19 11:26 AM  Result Value Ref Range   TSH 0.39 (L) 0.40 - 4.50 mIU/L  TSH     Status: None   Collection Time: 02/07/20 12:25 PM  Result Value Ref Range   TSH 2.89 0.40 - 4.50 mIU/L      PHQ2/9: Depression screen [facility] 2/9 02/19/2020 08/19/2019 07/23/2019 03/25/2019 02/18/2019  Decreased Interest 0 0 0 0 0  Down, Depressed, Hopeless 0 0 0 0 0  PHQ - 2 Score 0 0 0 0 0  Altered sleeping 0 0 - 0 0  Tired, decreased energy 0 0 - 1 0  Change in appetite 0 0 - 0 0  Feeling bad or failure about yourself  0 0 - 0 0  Trouble concentrating 0 0 - 0 0  Moving slowly or fidgety/restless 0 0 - 0 0  Suicidal thoughts 0 - - 0 0  PHQ-9 Score 0 0 - 1 0  Difficult doing work/chores Not difficult at all - - Not  difficult at all -  Some recent data might be hidden    phq 9 is negative   Fall Risk: Fall Risk  02/19/2020 08/19/2019 07/23/2019 03/25/2019 02/18/2019  Falls in the past year? 0 0 0 0 0  Comment - - - - -  Number falls in past yr: 0 0 0 0 0  Injury with Fall? 0 0 0 0 0  Comment - - - - -  Risk for fall due to : - - No Fall Risks - -  Risk for fall due to: Comment - - - - -  Follow up - - Falls prevention discussed - -     Functional Status Survey: Is the patient deaf or have difficulty hearing?: No Does the patient have difficulty seeing, even when wearing glasses/contacts?: No Does the patient have difficulty concentrating, remembering, or making decisions?: No Does the patient have difficulty walking or climbing stairs?: No Does the patient have difficulty dressing or bathing?: No Does the patient have difficulty doing errands alone such as visiting a doctor's office or shopping?: No    Assessment & Plan  1. Stage 3a chronic kidney disease (HCC)  - irbesartan (AVAPRO) 75 MG tablet; Take 1 tablet (75 mg total) by mouth daily.  Dispense: 90 tablet; Refill: 1  2. Benign hypertension with chronic kidney disease, stage III (HCC)  - irbesartan (AVAPRO) 75 MG tablet; Take 1 tablet (75 mg total) by mouth daily.  Dispense: 90 tablet; Refill: 1 - COMPLETE METABOLIC PANEL WITH GFR  3. Long-term use of high-risk medication  - COMPLETE METABOLIC PANEL WITH GFR  4. GERD without esophagitis   5. Hypertension, benign  - irbesartan (AVAPRO) 75 MG tablet; Take 1 tablet (75 mg total) by mouth daily.  Dispense: 90 tablet; Refill: 1  6. Migraine without aura and without status migrainosus, not intractable   7. Adult hypothyroidism  - TSH  8. Dyslipidemia  - Lipid panel - COMPLETE METABOLIC PANEL WITH GFR  9. Perennial allergic rhinitis with seasonal variation   10. Secondary hyperparathyroidism of renal origin ([place])   11. Osteopenia, unspecified location   12.  Hyperglycemia  - Hemoglobin A1c

## 2020-02-19 ENCOUNTER — Other Ambulatory Visit: Payer: Self-pay

## 2020-02-19 ENCOUNTER — Ambulatory Visit (INDEPENDENT_AMBULATORY_CARE_PROVIDER_SITE_OTHER): Payer: Medicare HMO | Admitting: Family Medicine

## 2020-02-19 ENCOUNTER — Encounter: Payer: Self-pay | Admitting: Family Medicine

## 2020-02-19 VITALS — BP 116/64 | HR 97 | Temp 98.2°F | Resp 16 | Ht 64.0 in | Wt 157.2 lb

## 2020-02-19 DIAGNOSIS — J302 Other seasonal allergic rhinitis: Secondary | ICD-10-CM

## 2020-02-19 DIAGNOSIS — G43009 Migraine without aura, not intractable, without status migrainosus: Secondary | ICD-10-CM

## 2020-02-19 DIAGNOSIS — K219 Gastro-esophageal reflux disease without esophagitis: Secondary | ICD-10-CM | POA: Diagnosis not present

## 2020-02-19 DIAGNOSIS — E785 Hyperlipidemia, unspecified: Secondary | ICD-10-CM | POA: Diagnosis not present

## 2020-02-19 DIAGNOSIS — I129 Hypertensive chronic kidney disease with stage 1 through stage 4 chronic kidney disease, or unspecified chronic kidney disease: Secondary | ICD-10-CM

## 2020-02-19 DIAGNOSIS — N183 Hypertensive chronic kidney disease with stage 1 through stage 4 chronic kidney disease, or unspecified chronic kidney disease: Secondary | ICD-10-CM

## 2020-02-19 DIAGNOSIS — J3089 Other allergic rhinitis: Secondary | ICD-10-CM | POA: Diagnosis not present

## 2020-02-19 DIAGNOSIS — N1831 Chronic kidney disease, stage 3a: Secondary | ICD-10-CM | POA: Diagnosis not present

## 2020-02-19 DIAGNOSIS — M8580 Other specified disorders of bone density and structure, unspecified site: Secondary | ICD-10-CM

## 2020-02-19 DIAGNOSIS — Z79899 Other long term (current) drug therapy: Secondary | ICD-10-CM | POA: Diagnosis not present

## 2020-02-19 DIAGNOSIS — E039 Hypothyroidism, unspecified: Secondary | ICD-10-CM

## 2020-02-19 DIAGNOSIS — I10 Essential (primary) hypertension: Secondary | ICD-10-CM | POA: Diagnosis not present

## 2020-02-19 DIAGNOSIS — N2581 Secondary hyperparathyroidism of renal origin: Secondary | ICD-10-CM | POA: Diagnosis not present

## 2020-02-19 DIAGNOSIS — R739 Hyperglycemia, unspecified: Secondary | ICD-10-CM

## 2020-02-19 MED ORDER — IRBESARTAN 75 MG PO TABS: 75.0000 mg | ORAL_TABLET | Freq: Every day | ORAL | 1 refills | Status: DC

## 2020-02-19 MED ORDER — FLUTICASONE PROPIONATE 50 MCG/ACT NA SUSP: 1.0000 | Freq: Every day | NASAL | 2 refills | Status: DC

## 2020-02-19 MED ORDER — TOPIRAMATE 50 MG PO TABS: 50.0000 mg | ORAL_TABLET | Freq: Every evening | ORAL | 1 refills | Status: DC

## 2020-02-20 LAB — COMPLETE METABOLIC PANEL WITH GFR
AG Ratio: 1.7 (calc) (ref 1.0–2.5)
ALT: 24 U/L (ref 6–29)
AST: 20 U/L (ref 10–35)
Albumin: 4.7 g/dL (ref 3.6–5.1)
Alkaline phosphatase (APISO): 47 U/L (ref 37–153)
BUN/Creatinine Ratio: 21 (calc) (ref 6–22)
BUN: 24 mg/dL (ref 7–25)
CO2: 26 mmol/L (ref 20–32)
Calcium: 10.3 mg/dL (ref 8.6–10.4)
Chloride: 104 mmol/L (ref 98–110)
Creat: 1.13 mg/dL — ABNORMAL HIGH (ref 0.60–0.88)
GFR, Est African American: 53 mL/min/{1.73_m2} — ABNORMAL LOW (ref >=60)
GFR, Est Non African American: 46 mL/min/{1.73_m2} — ABNORMAL LOW (ref >=60)
Globulin: 2.7 g/dL (calc) (ref 1.9–3.7)
Glucose, Bld: 91 mg/dL (ref 65–99)
Potassium: 4.6 mmol/L (ref 3.5–5.3)
Sodium: 139 mmol/L (ref 135–146)
Total Bilirubin: 0.5 mg/dL (ref 0.2–1.2)
Total Protein: 7.4 g/dL (ref 6.1–8.1)

## 2020-02-20 LAB — LIPID PANEL
Cholesterol: 166 mg/dL (ref <200)
HDL: 53 mg/dL (ref >=50)
LDL Cholesterol (Calc): 88 mg/dL (calc)
Non-HDL Cholesterol (Calc): 113 mg/dL (calc) (ref <130)
Total CHOL/HDL Ratio: 3.1 (calc) (ref <5.0)
Triglycerides: 146 mg/dL (ref <150)

## 2020-02-20 LAB — HEMOGLOBIN A1C
Hgb A1c MFr Bld: 5.1 % of total Hgb (ref <5.7)
Mean Plasma Glucose: 100 mg/dL
eAG (mmol/L): 5.5 mmol/L

## 2020-02-20 LAB — TSH: TSH: 3.08 mIU/L (ref 0.40–4.50)

## 2020-02-21 ENCOUNTER — Other Ambulatory Visit: Payer: Self-pay | Admitting: Family Medicine

## 2020-02-21 DIAGNOSIS — R32 Unspecified urinary incontinence: Secondary | ICD-10-CM | POA: Diagnosis not present

## 2020-02-21 DIAGNOSIS — M1990 Unspecified osteoarthritis, unspecified site: Secondary | ICD-10-CM | POA: Diagnosis not present

## 2020-02-21 DIAGNOSIS — K219 Gastro-esophageal reflux disease without esophagitis: Secondary | ICD-10-CM | POA: Diagnosis not present

## 2020-02-21 DIAGNOSIS — J302 Other seasonal allergic rhinitis: Secondary | ICD-10-CM | POA: Diagnosis not present

## 2020-02-21 DIAGNOSIS — G43909 Migraine, unspecified, not intractable, without status migrainosus: Secondary | ICD-10-CM | POA: Diagnosis not present

## 2020-02-21 DIAGNOSIS — E785 Hyperlipidemia, unspecified: Secondary | ICD-10-CM | POA: Diagnosis not present

## 2020-02-21 DIAGNOSIS — H409 Unspecified glaucoma: Secondary | ICD-10-CM | POA: Diagnosis not present

## 2020-02-21 DIAGNOSIS — E039 Hypothyroidism, unspecified: Secondary | ICD-10-CM | POA: Diagnosis not present

## 2020-02-21 DIAGNOSIS — I10 Essential (primary) hypertension: Secondary | ICD-10-CM | POA: Diagnosis not present

## 2020-02-21 DIAGNOSIS — M810 Age-related osteoporosis without current pathological fracture: Secondary | ICD-10-CM | POA: Diagnosis not present

## 2020-02-21 MED ORDER — SYNTHROID 75 MCG PO TABS: 75.0000 ug | ORAL_TABLET | Freq: Every day | ORAL | 0 refills | Status: DC

## 2020-04-27 DIAGNOSIS — D2272 Melanocytic nevi of left lower limb, including hip: Secondary | ICD-10-CM | POA: Diagnosis not present

## 2020-04-27 DIAGNOSIS — L718 Other rosacea: Secondary | ICD-10-CM | POA: Diagnosis not present

## 2020-04-27 DIAGNOSIS — Z85820 Personal history of malignant melanoma of skin: Secondary | ICD-10-CM | POA: Diagnosis not present

## 2020-04-27 DIAGNOSIS — L920 Granuloma annulare: Secondary | ICD-10-CM | POA: Diagnosis not present

## 2020-04-27 DIAGNOSIS — L928 Other granulomatous disorders of the skin and subcutaneous tissue: Secondary | ICD-10-CM | POA: Diagnosis not present

## 2020-04-27 DIAGNOSIS — Z86006 Personal history of melanoma in-situ: Secondary | ICD-10-CM | POA: Diagnosis not present

## 2020-04-27 DIAGNOSIS — D485 Neoplasm of uncertain behavior of skin: Secondary | ICD-10-CM | POA: Diagnosis not present

## 2020-04-27 DIAGNOSIS — Z85828 Personal history of other malignant neoplasm of skin: Secondary | ICD-10-CM | POA: Diagnosis not present

## 2020-05-12 ENCOUNTER — Other Ambulatory Visit: Payer: Self-pay

## 2020-05-20 ENCOUNTER — Other Ambulatory Visit: Payer: Self-pay | Admitting: Family Medicine

## 2020-05-20 NOTE — Telephone Encounter (Signed)
Requested Prescriptions  Pending Prescriptions Disp Refills  . SYNTHROID 75 MCG tablet [Pharmacy Med Name: SYNTHROID 75 MCG TABLET] 90 tablet 2    Sig: TAKE 1 TABLET (75 MCG TOTAL) BY MOUTH DAILY BEFORE BREAKFAST. ONE DAILY AND SKIP SUNDAYS     Endocrinology:  Hypothyroid Agents Failed - 05/20/2020  2:56 AM      Failed - TSH needs to be rechecked within 3 months after an abnormal result. Refill until TSH is due.      Passed - TSH in normal range and within 360 days    TSH  Date Value Ref Range Status  02/19/2020 3.08 0.40 - 4.50 mIU/L Final         Passed - Valid encounter within last 12 months    Recent Outpatient Visits          3 months ago Benign hypertension with chronic kidney disease, stage III [facility])   [facility] [place], Drue Stager, MD   9 months ago Migraine without aura and without status migrainosus, not intractable   [facility] Steele Sizer, MD   1 year ago Lower abdominal pain   [place], MD   1 year ago Secondary hyperparathyroidism of renal origin [facility])   [facility] Steele Sizer, MD   1 year ago Dyslipidemia   [facility] Steele Sizer, MD      Future Appointments            In 2 months  [facility], [place]   In 3 months Steele Sizer, MD [facility] [place], [facility]

## 2020-06-28 ENCOUNTER — Other Ambulatory Visit: Payer: Self-pay | Admitting: Family Medicine

## 2020-06-28 DIAGNOSIS — E785 Hyperlipidemia, unspecified: Secondary | ICD-10-CM

## 2020-06-28 NOTE — Telephone Encounter (Signed)
Requested Prescriptions  Pending Prescriptions Disp Refills  . pravastatin (PRAVACHOL) 80 MG tablet [Pharmacy Med Name: PRAVASTATIN SODIUM 80 MG TAB] 90 tablet 3    Sig: TAKE 1 TABLET BY MOUTH EVERY DAY     Cardiovascular:  Antilipid - Statins Passed - 06/28/2020  9:06 AM      Passed - Total Cholesterol in normal range and within 360 days    Cholesterol, Total  Date Value Ref Range Status  01/08/2015 162 100 - 199 mg/dL Final   Cholesterol  Date Value Ref Range Status  02/19/2020 166 <200 mg/dL Final         Passed - LDL in normal range and within 360 days    LDL Cholesterol (Calc)  Date Value Ref Range Status  02/19/2020 88 mg/dL (calc) Final    Comment:    Reference range: <100 . Desirable range <100 mg/dL for primary prevention;   <70 mg/dL for patients with CHD or diabetic patients  with > or = 2 CHD risk factors. Marland Kitchen LDL-C is now calculated using the Martin-Hopkins  calculation, which is a validated novel method providing  better accuracy than the Friedewald equation in the  estimation of LDL-C.  Cresenciano Genre et al. Annamaria Helling. 2993;716(96): 2061-2068  (http://education.[employer].com/faq/FAQ164)          Passed - HDL in normal range and within 360 days    HDL  Date Value Ref Range Status  02/19/2020 53 > OR = 50 mg/dL Final  01/08/2015 62 >39 mg/dL Final         Passed - Triglycerides in normal range and within 360 days    Triglycerides  Date Value Ref Range Status  02/19/2020 146 <150 mg/dL Final         Passed - Patient is not pregnant      Passed - Valid encounter within last 12 months    Recent Outpatient Visits          4 months ago Benign hypertension with chronic kidney disease, stage III [facility])   [facility] [place], Drue Stager, MD   10 months ago Migraine without aura and without status migrainosus, not intractable   [facility] Steele Sizer, MD   1 year ago Lower abdominal pain   [place], MD   1 year ago Secondary hyperparathyroidism of renal origin [facility])   [facility] Steele Sizer, MD   1 year ago Dyslipidemia   [facility] Steele Sizer, MD      Future Appointments            In 3 weeks  [facility], [place]   In 1 month Steele Sizer, MD [facility], [facility]

## 2020-07-19 ENCOUNTER — Other Ambulatory Visit: Payer: Self-pay | Admitting: Family Medicine

## 2020-07-19 DIAGNOSIS — M8580 Other specified disorders of bone density and structure, unspecified site: Secondary | ICD-10-CM

## 2020-07-19 NOTE — Telephone Encounter (Signed)
Requested medication (s) are due for refill today: yes  Requested medication (s) are on the active medication list: yes  Last refill:  02/04/20  Future visit scheduled: yes  Notes to clinic:  overdue lab work   Requested Prescriptions  Pending Prescriptions Disp Refills   alendronate (FOSAMAX) 35 MG tablet [Pharmacy Med Name: ALENDRONATE SODIUM 35 MG TAB] 12 tablet 1    Sig: TAKE 1 TABLET BY MOUTH EVERY 7 DAYS. TAKE WITH WATER ON AN EMPTY STOMACH.      Endocrinology:  Bisphosphonates Failed - 07/19/2020  9:10 AM      Failed - Vitamin D in normal range and within 360 days    Vit D, 25-Hydroxy  Date Value Ref Range Status  07/05/2017 60  Final          Passed - Ca in normal range and within 360 days    Calcium  Date Value Ref Range Status  02/19/2020 10.3 8.6 - 10.4 mg/dL Final          Passed - Valid encounter within last 12 months    Recent Outpatient Visits           5 months ago Benign hypertension with chronic kidney disease, stage III [facility])   [facility] [place], Drue Stager, MD   11 months ago Migraine without aura and without status migrainosus, not intractable   [facility] Steele Sizer, MD   1 year ago Lower abdominal pain   [place], MD   1 year ago Secondary hyperparathyroidism of renal origin [facility])   [facility] Steele Sizer, MD   1 year ago Dyslipidemia   [facility] Steele Sizer, MD       Future Appointments             In 3 weeks  [facility], [place]   In 1 month Steele Sizer, MD [place]

## 2020-07-23 ENCOUNTER — Ambulatory Visit: Payer: Medicare HMO

## 2020-07-25 ENCOUNTER — Other Ambulatory Visit: Payer: Self-pay | Admitting: Family Medicine

## 2020-07-25 DIAGNOSIS — K219 Gastro-esophageal reflux disease without esophagitis: Secondary | ICD-10-CM

## 2020-07-25 NOTE — Telephone Encounter (Signed)
Requested Prescriptions  Pending Prescriptions Disp Refills  . famotidine (PEPCID) 40 MG tablet [Pharmacy Med Name: FAMOTIDINE 40 MG TABLET] 90 tablet 0    Sig: TAKE 1 TABLET BY MOUTH EVERY DAY     Gastroenterology:  H2 Antagonists Passed - 07/25/2020 10:56 AM      Passed - Valid encounter within last 12 months    Recent Outpatient Visits          5 months ago Benign hypertension with chronic kidney disease, stage III [facility])   [facility] [place], Drue Stager, MD   11 months ago Migraine without aura and without status migrainosus, not intractable   [facility] Steele Sizer, MD   1 year ago Lower abdominal pain   [place], MD   1 year ago Secondary hyperparathyroidism of renal origin [facility])   [facility] Steele Sizer, MD   1 year ago Dyslipidemia   [facility] Steele Sizer, MD      Future Appointments            In 2 weeks  [facility], [place]   In 1 month Steele Sizer, MD [facility], [facility]

## 2020-08-11 ENCOUNTER — Ambulatory Visit (INDEPENDENT_AMBULATORY_CARE_PROVIDER_SITE_OTHER): Payer: Medicare HMO

## 2020-08-11 DIAGNOSIS — Z780 Asymptomatic menopausal state: Secondary | ICD-10-CM

## 2020-08-11 DIAGNOSIS — Z0000 Encounter for general adult medical examination without abnormal findings: Secondary | ICD-10-CM | POA: Diagnosis not present

## 2020-08-11 NOTE — Patient Instructions (Signed)
Theresa Hall , Thank you for taking time to come for your Medicare Wellness Visit. I appreciate your ongoing commitment to your health goals. Please review the following plan we discussed and let me know if I can assist you in the future.   Screening recommendations/referrals: Colonoscopy: no longer required Mammogram: done 02/13/20 Bone Density: done 11/07/18. Please call [phone] to schedule your bone density screening.  Recommended yearly ophthalmology/optometry visit for glaucoma screening and checkup Recommended yearly dental visit for hygiene and checkup  Vaccinations: Influenza vaccine: done 09/12/19 Pneumococcal vaccine: done 09/28/15 Tdap vaccine: due Shingles vaccine: Shingrix discussed. Please contact your pharmacy for coverage information.  Covid-19:done 119/21, 02/21/19, 10/30/19 & 05/09/20  Advanced directives: Please bring a copy of your health care power of attorney and living will to the office at your convenience.   Conditions/risks identified: Keep up the great work!  Next appointment: Follow up in one year for your annual wellness visit    Preventive Care 82 Years and Older, Female Preventive care refers to lifestyle choices and visits with your health care provider that can promote health and wellness. What does preventive care include? A yearly physical exam. This is also called an annual well check. Dental exams once or twice a year. Routine eye exams. Ask your health care provider how often you should have your eyes checked. Personal lifestyle choices, including: Daily care of your teeth and gums. Regular physical activity. Eating a healthy diet. Avoiding tobacco and drug use. Limiting alcohol use. Practicing safe sex. Taking low-dose aspirin every day. Taking vitamin and mineral supplements as recommended by your health care provider. What happens during an annual well check? The services and screenings done by your health care provider during your annual  well check will depend on your age, overall health, lifestyle risk factors, and family history of disease. Counseling  Your health care provider may ask you questions about your: Alcohol use. Tobacco use. Drug use. Emotional well-being. Home and relationship well-being. Sexual activity. Eating habits. History of falls. Memory and ability to understand (cognition). Work and work Statistician. Reproductive health. Screening  You may have the following tests or measurements: Height, weight, and BMI. Blood pressure. Lipid and cholesterol levels. These may be checked every 5 years, or more frequently if you are over 82 years old. Skin check. Lung cancer screening. You may have this screening every year starting at age 82 if you have a 30-pack-year history of smoking and currently smoke or have quit within the past 15 years. Fecal occult blood test (FOBT) of the stool. You may have this test every year starting at age 82. Flexible sigmoidoscopy or colonoscopy. You may have a sigmoidoscopy every 5 years or a colonoscopy every 10 years starting at age 82. Hepatitis C blood test. Hepatitis B blood test. Sexually transmitted disease (STD) testing. Diabetes screening. This is done by checking your blood sugar (glucose) after you have not eaten for a while (fasting). You may have this done every 1-3 years. Bone density scan. This is done to screen for osteoporosis. You may have this done starting at age 70. Mammogram. This may be done every 1-2 years. Talk to your health care provider about how often you should have regular mammograms. Talk with your health care provider about your test results, treatment options, and if necessary, the need for more tests. Vaccines  Your health care provider may recommend certain vaccines, such as: Influenza vaccine. This is recommended every year. Tetanus, diphtheria, and acellular pertussis (Tdap, Td) vaccine. You  may need a Td booster every 10 years. Zoster  vaccine. You may need this after age 82. Pneumococcal 13-valent conjugate (PCV13) vaccine. One dose is recommended after age 82. Pneumococcal polysaccharide (PPSV23) vaccine. One dose is recommended after age 69. Talk to your health care provider about which screenings and vaccines you need and how often you need them. This information is not intended to replace advice given to you by your health care provider. Make sure you discuss any questions you have with your health care provider. Document Released: 01/23/2015 Document Revised: 09/16/2015 Document Reviewed: 10/28/2014 Elsevier Interactive Patient Education  2017 [place] Prevention in the Home Falls can cause injuries. They can happen to people of all ages. There are many things you can do to make your home safe and to help prevent falls. What can I do on the outside of my home? Regularly fix the edges of walkways and driveways and fix any cracks. Remove anything that might make you trip as you walk through a door, such as a raised step or threshold. Trim any bushes or trees on the path to your home. Use bright outdoor lighting. Clear any walking paths of anything that might make someone trip, such as rocks or tools. Regularly check to see if handrails are loose or broken. Make sure that both sides of any steps have handrails. Any raised decks and porches should have guardrails on the edges. Have any leaves, snow, or ice cleared regularly. Use sand or salt on walking paths during winter. Clean up any spills in your garage right away. This includes oil or grease spills. What can I do in the bathroom? Use night lights. Install grab bars by the toilet and in the tub and shower. Do not use towel bars as grab bars. Use non-skid mats or decals in the tub or shower. If you need to sit down in the shower, use a plastic, non-slip stool. Keep the floor dry. Clean up any water that spills on the floor as soon as it happens. Remove  soap buildup in the tub or shower regularly. Attach bath mats securely with double-sided non-slip rug tape. Do not have throw rugs and other things on the floor that can make you trip. What can I do in the bedroom? Use night lights. Make sure that you have a light by your bed that is easy to reach. Do not use any sheets or blankets that are too big for your bed. They should not hang down onto the floor. Have a firm chair that has side arms. You can use this for support while you get dressed. Do not have throw rugs and other things on the floor that can make you trip. What can I do in the kitchen? Clean up any spills right away. Avoid walking on wet floors. Keep items that you use a lot in easy-to-reach places. If you need to reach something above you, use a strong step stool that has a grab bar. Keep electrical cords out of the way. Do not use floor polish or wax that makes floors slippery. If you must use wax, use non-skid floor wax. Do not have throw rugs and other things on the floor that can make you trip. What can I do with my stairs? Do not leave any items on the stairs. Make sure that there are handrails on both sides of the stairs and use them. Fix handrails that are broken or loose. Make sure that handrails are as long as the  stairways. Check any carpeting to make sure that it is firmly attached to the stairs. Fix any carpet that is loose or worn. Avoid having throw rugs at the top or bottom of the stairs. If you do have throw rugs, attach them to the floor with carpet tape. Make sure that you have a light switch at the top of the stairs and the bottom of the stairs. If you do not have them, ask someone to add them for you. What else can I do to help prevent falls? Wear shoes that: Do not have high heels. Have rubber bottoms. Are comfortable and fit you well. Are closed at the toe. Do not wear sandals. If you use a stepladder: Make sure that it is fully opened. Do not climb a  closed stepladder. Make sure that both sides of the stepladder are locked into place. Ask someone to hold it for you, if possible. Clearly mark and make sure that you can see: Any grab bars or handrails. First and last steps. Where the edge of each step is. Use tools that help you move around (mobility aids) if they are needed. These include: Canes. Walkers. Scooters. Crutches. Turn on the lights when you go into a dark area. Replace any light bulbs as soon as they burn out. Set up your furniture so you have a clear path. Avoid moving your furniture around. If any of your floors are uneven, fix them. If there are any pets around you, be aware of where they are. Review your medicines with your doctor. Some medicines can make you feel dizzy. This can increase your chance of falling. Ask your doctor what other things that you can do to help prevent falls. This information is not intended to replace advice given to you by your health care provider. Make sure you discuss any questions you have with your health care provider. Document Released: 10/23/2008 Document Revised: 06/04/2015 Document Reviewed: 01/31/2014 Elsevier Interactive Patient Education  2017 [employer].

## 2020-08-11 NOTE — Progress Notes (Signed)
Subjective:   Theresa Hall is a 80 y.o. female who presents for Medicare Annual (Subsequent) preventive examination.  Virtual Visit via Telephone Note  I connected with  Theresa Hall on 08/11/20 at  2:50 PM EDT by telephone and verified that I am speaking with the correct person using two identifiers.  Location: Patient: home Provider: [place] Persons participating in the virtual visit: [place]   I discussed the limitations, risks, security and privacy concerns of performing an evaluation and management service by telephone and the availability of in person appointments. The patient expressed understanding and agreed to proceed.  Interactive audio and video telecommunications were attempted between this nurse and patient, however failed, due to patient having technical difficulties OR patient did not have access to video capability.  We continued and completed visit with audio only.  Some vital signs may be absent or patient reported.   Clemetine Marker, LPN   Review of Systems     Cardiac Risk Factors include: advanced age (>26mn, >>67women);dyslipidemia;hypertension     Objective:    There were no vitals filed for this visit. There is no height or weight on file to calculate BMI.  Advanced Directives 08/11/2020 08/07/2019 07/23/2019 07/03/2019 07/20/2018 07/18/2017 06/20/2016  Does Patient Have a Medical Advance Directive? Yes Yes Yes Yes Yes Yes Yes  Type of AParamedicof ARobinsonLiving will HGolfLiving will HSpoonerLiving will HEl PasoLiving will HHartmanLiving will HMount CarbonLiving will HKingsburyLiving will  Does patient want to make changes to medical advance directive? - No - Patient declined - No - Patient declined - - -  Copy of HHancockin Chart? No - copy requested No - copy requested No  - copy requested No - copy requested No - copy requested No - copy requested No - copy requested    Current Medications (verified) Outpatient Encounter Medications as of 08/11/2020  Medication Sig   alendronate (FOSAMAX) 35 MG tablet TAKE 1 TABLET BY MOUTH EVERY 7 DAYS. TAKE WITH WATER ON AN EMPTY STOMACH.   azelastine (ASTELIN) 0.1 % nasal spray PLACE 2 SPRAYS INTO BOTH NOSTRILS 2 (TWO) TIMES DAILY. USE IN EACH NOSTRIL AS DIRECTED   Calcium Polycarbophil (FIBERCON PO) Take 1 tablet by mouth at bedtime.    cholecalciferol (VITAMIN D) 1000 units tablet Take 1 capsule by mouth daily.   famotidine (PEPCID) 40 MG tablet TAKE 1 TABLET BY MOUTH EVERY DAY   Fish Oil-Cholecalciferol (FISH OIL + D3) 1200-1000 MG-UNIT CAPS Take 1 capsule by mouth daily.   fluticasone (FLONASE) 50 MCG/ACT nasal spray Place 1 spray into both nostrils daily.   irbesartan (AVAPRO) 75 MG tablet Take 1 tablet (75 mg total) by mouth daily.   Multiple Vitamin (MULTIVITAMIN) tablet Take 1 tablet by mouth daily.   Multiple Vitamins-Minerals (HAIR SKIN NAILS PO) Take by mouth.   pravastatin (PRAVACHOL) 80 MG tablet TAKE 1 TABLET BY MOUTH EVERY DAY   SYNTHROID 75 MCG tablet TAKE 1 TABLET (75 MCG TOTAL) BY MOUTH DAILY BEFORE BREAKFAST. ONE DAILY AND SKIP SUNDAYS   timolol (TIMOPTIC) 0.5 % ophthalmic solution Administer 1 drop into both eyes 1 (one) time each day in the morning   topiramate (TOPAMAX) 50 MG tablet Take 1 tablet (50 mg total) by mouth every evening.   vitamin C (ASCORBIC ACID) 500 MG tablet Take 500 mg by mouth daily.   [DISCONTINUED] [id number]  mRNA vaccine, Pfizer, 30 MCG/0.3ML injection USE AS DIRECTED   [DISCONTINUED] metroNIDAZOLE (METROCREAM) 0.75 % cream Apply topically 2 (two) times daily.   No facility-administered encounter medications on file as of 08/11/2020.    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   Cancer ([place]) 2006   melanoma on shoulder    Chronic kidney disease, stage I    Chronic kidney disease, stage III (moderate) (HCC)    Diverticulitis of colon (without mention of hemorrhage)(562.11)    GERD (gastroesophageal reflux disease)    Glaucoma    Hypertension    Migraine without aura, without mention of intractable migraine without mention of status migrainosus    Osteoarthritis    Osteopenia    Other abnormal glucose    Other and unspecified hyperlipidemia    PONV (postoperative nausea and vomiting)    Postablative ovarian failure    Pure hypercholesterolemia    Unspecified glaucoma(365.9)    Unspecified hypothyroidism    Vertigo    none for over 2 yrs   Past Surgical History:  Procedure Laterality Date   APPENDECTOMY     BASAL CELL CARCINOMA EXCISION     BASAL CELL CARCINOMA EXCISION  2014   nasal   CATARACT EXTRACTION W/PHACO Left 07/03/2019   Procedure: CATARACT EXTRACTION PHACO AND INTRAOCULAR LENS PLACEMENT (IOC) LEFT KAHOOK DUAL BLADE GONIOTOMY 9.84 01:14.5 13.2%;  Surgeon: Leandrew Koyanagi, MD;  Location: [facility];  Service: Ophthalmology;  Laterality: Left;   CATARACT EXTRACTION W/PHACO Right 08/07/2019   Procedure: CATARACT EXTRACTION PHACO AND INTRAOCULAR LENS PLACEMENT ([place]) RIGHT KAHOOK DUAL BLADE GONIOTOMY;  Surgeon: Leandrew Koyanagi, MD;  Location: [place];  Service: Ophthalmology;  Laterality: Right;  6.46 0:59.4 10.9%   CHOLECYSTECTOMY     COLONOSCOPY     ORIF PATELLA Right 08/15/2015   Procedure: OPEN REDUCTION INTERNAL (ORIF) FIXATION PATELLA  and application wound vac;  Surgeon: Corky Mull, MD;  Location: [facility];  Service: Orthopedics;  Laterality: Right;   TONSILLECTOMY     VAGINAL HYSTERECTOMY     Family History  Problem Relation Age of Onset   Heart attack Father        x2   Heart disease Sister    Kidney disease Sister    Diabetes Sister    Heart  attack Brother    Diabetes Brother    Heart attack Brother    Breast cancer Neg Hx    Social History   Socioeconomic History   Marital status: Widowed    Spouse name: Gershon Mussel   Number of children: 2   Years of education: some college   Highest education level: 12th grade  Occupational History   Occupation: Retired  Tobacco Use   Smoking status: Never   Smokeless tobacco: Never   Tobacco comments:    smoking cessation materials not required  Vaping Use   Vaping Use: Never used  Substance and Sexual Activity   Alcohol use: No   Drug use: No   Sexual activity: Not Currently    Birth control/protection: Surgical    Comment: TAH-1989  Other Topics Concern   Not on file  Social History Narrative   She has been a widow since 2009. Pt lives alone.    Social Determinants of Health   Financial Resource Strain: Low Risk  Difficulty of Paying Living Expenses: Not hard at all  Food Insecurity: No Food Insecurity   Worried About [place] in the Last Year: Never true   Ran Out of Food in the Last Year: Never true  Transportation Needs: No Transportation Needs   Lack of Transportation (Medical): No   Lack of Transportation (Non-Medical): No  Physical Activity: Insufficiently Active   Days of Exercise per Week: 7 days   Minutes of Exercise per Session: 10 min  Stress: No Stress Concern Present   Feeling of Stress : Not at all  Social Connections: Moderately Integrated   Frequency of Communication with Friends and Family: More than three times a week   Frequency of Social Gatherings with Friends and Family: Three times a week   Attends Religious Services: More than 4 times per year   Active Member of Clubs or Organizations: Yes   Attends Archivist Meetings: More than 4 times per year   Marital Status: Widowed    Tobacco Counseling Counseling given: Not Answered Tobacco comments: smoking cessation materials not required   Clinical Intake:  Pre-visit  preparation completed: Yes  Pain : No/denies pain     Nutritional Risks: None Diabetes: No  How often do you need to have someone help you when you read instructions, pamphlets, or other written materials from your doctor or pharmacy?: 1 - Never    Interpreter Needed?: No  Information entered by :: Clemetine Marker LPN   Activities of Daily Living In your present state of health, do you have any difficulty performing the following activities: 08/11/2020 02/19/2020  Hearing? N N  Vision? N N  Difficulty concentrating or making decisions? N N  Walking or climbing stairs? N N  Dressing or bathing? N N  Doing errands, shopping? N N  Preparing Food and eating ? N -  Using the Toilet? N -  In the past six months, have you accidently leaked urine? N -  Do you have problems with loss of bowel control? N -  Managing your Medications? N -  Managing your Finances? N -  Housekeeping or managing your Housekeeping? N -  Some recent data might be hidden    Patient Care Team: Steele Sizer, MD as PCP - General (Family Medicine) Lavonia Dana, MD as Consulting Physician (Nephrology) Dasher, Rayvon Char, MD as Consulting Physician (Dermatology) Kem Boroughs, FNP as Nurse Practitioner (Gynecology)  Indicate any recent Medical Services you may have received from other than [facility] providers in the past year (date may be approximate).     Assessment:   This is a routine wellness examination for [place].  Hearing/Vision screen Hearing Screening - Comments:: Pt denies hearing difficulty Vision Screening - Comments:: Annual visions screenings done at [facility] Dr. Wallace Going  Dietary issues and exercise activities discussed: Current Exercise Habits: Home exercise routine, Type of exercise: stretching, Time (Minutes): 10, Frequency (Times/Week): 7, Weekly Exercise (Minutes/Week): 70, Intensity: Mild, Exercise limited by: orthopedic condition(s)   Goals Addressed   None     Depression Screen PHQ 2/9 Scores 08/11/2020 02/19/2020 08/19/2019 07/23/2019 03/25/2019 02/18/2019 08/13/2018  PHQ - 2 Score 0 0 0 0 0 0 0  PHQ- 9 Score - 0 0 - 1 0 0  Exception Documentation - - - - - - -    Fall Risk Fall Risk  08/11/2020 02/19/2020 08/19/2019 07/23/2019 03/25/2019  Falls in the past year? 0 0 0 0 0  Comment - - - - -  Number  falls in past yr: 0 0 0 0 0  Injury with Fall? 0 0 0 0 0  Comment - - - - -  Risk for fall due to : No Fall Risks - - No Fall Risks -  Risk for fall due to: Comment - - - - -  Follow up Falls prevention discussed - - Falls prevention discussed -    FALL RISK PREVENTION PERTAINING TO THE HOME:  Any stairs in or around the home? Yes  If so, are there any without handrails? Yes  Home free of loose throw rugs in walkways, pet beds, electrical cords, etc? Yes  Adequate lighting in your home to reduce risk of falls? Yes   ASSISTIVE DEVICES UTILIZED TO PREVENT FALLS:  Life alert? No  Use of a cane, walker or w/c? No  Grab bars in the bathroom? No  Shower chair or bench in shower? Yes  Elevated toilet seat or a handicapped toilet? Yes   TIMED UP AND GO:  Was the test performed? No . Telephonic visit.   Cognitive Function: Normal cognitive status assessed by direct observation by this Nurse Health Advisor. No abnormalities found.       6CIT Screen 07/23/2019 07/20/2018 07/18/2017  What Year? 0 points 0 points 0 points  What month? 0 points 0 points 0 points  What time? 0 points 0 points 0 points  Count back from 20 0 points 0 points 0 points  Months in reverse 0 points 0 points 0 points  Repeat phrase 0 points 0 points 0 points  Total Score 0 0 0    Immunizations Immunization History  Administered Date(s) Administered   Fluad Quad(high Dose 65+) 10/16/2018   Influenza Split 11/01/2006, 10/16/2008   Influenza, High Dose Seasonal PF 12/12/2014, 09/28/2015, 10/18/2016, 10/13/2017   Influenza, Seasonal, Injecte, Preservative Fre 09/07/2010, 01/16/2012    Influenza,inj,Quad PF,6+ Mos 12/10/2012, 10/14/2013   Influenza-Unspecified 09/12/2019   PFIZER Comirnaty(Gray Top)Covid-19 Tri-Sucrose Vaccine 05/09/2020   PFIZER(Purple Top)SARS-COV-2 Vaccination 01/29/2019, 02/21/2019, 10/29/2019, 05/09/2020   Pneumococcal Conjugate-13 12/17/2013   Pneumococcal Polysaccharide-23 09/28/2015   Tdap 03/03/2010   Zoster, Live 11/28/2007    TDAP status: Due, Education has been provided regarding the importance of this vaccine. Advised may receive this vaccine at local pharmacy or Health Dept. Aware to provide a copy of the vaccination record if obtained from local pharmacy or Health Dept. Verbalized acceptance and understanding.  Flu Vaccine status: Up to date  Pneumococcal vaccine status: Up to date  Covid-19 vaccine status: Completed vaccines  Qualifies for Shingles Vaccine? Yes   Zostavax completed Yes   Shingrix Completed?: No.    Education has been provided regarding the importance of this vaccine. Patient has been advised to call insurance company to determine out of pocket expense if they have not yet received this vaccine. Advised may also receive vaccine at local pharmacy or Health Dept. Verbalized acceptance and understanding.  Screening Tests Health Maintenance  Topic Date Due   Zoster Vaccines- Shingrix (1 of 2) Never done   TETANUS/TDAP  03/03/2020   INFLUENZA VACCINE  08/10/2020   COVID-19 Vaccine (5 - Booster for Pfizer series) 09/08/2020   MAMMOGRAM  02/12/2021   DEXA SCAN  Completed   PNA vac Low Risk Adult  Completed   HPV VACCINES  Aged Out    Health Maintenance  Health Maintenance Due  Topic Date Due   Zoster Vaccines- Shingrix (1 of 2) Never done   TETANUS/TDAP  03/03/2020   INFLUENZA VACCINE  08/10/2020  Colorectal cancer screening: No longer required.   Mammogram status: Completed 02/13/20. Repeat every year  Bone Density status: Completed 11/07/18. Results reflect: Bone density results: OSTEOPENIA. Repeat every  2 years.  Lung Cancer Screening: (Low Dose CT Chest recommended if Age 71-80 years, 30 pack-year currently smoking OR have quit w/in 15years.) does not qualify.   Additional Screening:  Hepatitis C Screening: does not qualify  Vision Screening: Recommended annual ophthalmology exams for early detection of glaucoma and other disorders of the eye. Is the patient up to date with their annual eye exam?  Yes  Who is the provider or what is the name of the office in which the patient attends annual eye exams? [facility].   Dental Screening: Recommended annual dental exams for proper oral hygiene  Community Resource Referral / Chronic Care Management: CRR required this visit?  No   CCM required this visit?  No      Plan:     I have personally reviewed and noted the following in the patient's chart:   Medical and social history Use of alcohol, tobacco or illicit drugs  Current medications and supplements including opioid prescriptions.  Functional ability and status Nutritional status Physical activity Advanced directives List of other physicians Hospitalizations, surgeries, and ER visits in previous 12 months Vitals Screenings to include cognitive, depression, and falls Referrals and appointments  In addition, I have reviewed and discussed with patient certain preventive protocols, quality metrics, and best practice recommendations. A written personalized care plan for preventive services as well as general preventive health recommendations were provided to patient.     Clemetine Marker, LPN   [id number]   Nurse Notes: none

## 2020-08-18 DIAGNOSIS — H401131 Primary open-angle glaucoma, bilateral, mild stage: Secondary | ICD-10-CM | POA: Diagnosis not present

## 2020-08-24 NOTE — Progress Notes (Signed)
Name: Theresa Hall   MRN: [id number]    DOB: [birth year]   Date:08/25/2020       Progress Note  Subjective  Chief Complaint  Follow Up  HPI:  HTN and she has a history of CKI stage III: reviewed last labs and GFR has been stable, she has a follow up with Dr.Kolluru tomorrow.  She is on low dose of Avapro no chest pain palpitation or dizziness.  Denies pruritus or change in urinary frequency .She does not take NSAID's    Hypothyroidism: last TSH was at goal, continue current dose of 75 daily and skip Sundays'. She denies dry skin, hair loss ,  change in bowel movement or dysphagia.    Migraine headaches: she had it for many years, she is compliant with Topamax but no episodes since husband died 56 years ago, she is down to every other night and we will try stopping medication    Hyperlipidemia: taking medication and denies side effects, reviewed labs done LDL was 88    Osteopenia: discussed results, discussed high calcium and vitamin D diet and she has been taking low dose fosamax without side effects. Reviewed bone density done 10/2018 osteopenia stable with mild improvement , she is due for repeat study    GERD/Hiatal hernia: she states symptoms are intermittent, she eats dinner around 7 pm, smaller portions , she states rarely has symptoms now, advised to try taking pepcid every other day and see how she feels.   Fatty liver: found on CT abdomen and pelvis..No longer having constipation or abdominal cramping since eating more fiber. Last liver enzymes at goal   Weight loss: she intentionally stopped eating candy and desserts very often and has lost 7 lbs, appetite is good , eats healthier and smaller portions   Patient Active Problem List   Diagnosis Date Noted   Benign hypertensive kidney disease with chronic kidney disease 07/22/2019   BCC (basal cell carcinoma of skin) 10/16/2017   Secondary hyperparathyroidism of renal origin ([place]) 07/18/2017   Osteopenia 08/15/2016   Closed  displaced comminuted fracture of right patella 08/20/2015   Reflux esophagitis 06/23/2014   HLD (hyperlipidemia) 06/23/2014   Palpitations 06/23/2014   Chronic kidney disease (CKD), stage III (moderate) ([place]) 08/24/2009   History of diverticulitis 05/14/2008   Adult hypothyroidism 12/04/2006   Migraine without aura 08/02/2006   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    Past Surgical History:  Procedure Laterality Date   APPENDECTOMY     BASAL CELL CARCINOMA EXCISION     BASAL CELL CARCINOMA EXCISION  2014   nasal   CATARACT EXTRACTION W/PHACO Left 07/03/2019   Procedure: CATARACT EXTRACTION PHACO AND INTRAOCULAR LENS PLACEMENT ([facility]) LEFT KAHOOK DUAL BLADE GONIOTOMY 9.84 01:14.5 13.2%;  Surgeon: Leandrew Koyanagi, MD;  Location: [place];  Service: Ophthalmology;  Laterality: Left;   CATARACT EXTRACTION W/PHACO Right 08/07/2019   Procedure: CATARACT EXTRACTION PHACO AND INTRAOCULAR LENS PLACEMENT ([place]) RIGHT KAHOOK DUAL BLADE GONIOTOMY;  Surgeon: Leandrew Koyanagi, MD;  Location: [place];  Service: Ophthalmology;  Laterality: Right;  6.46 0:59.4 10.9%   CHOLECYSTECTOMY     COLONOSCOPY     ORIF PATELLA Right 08/15/2015   Procedure: OPEN REDUCTION INTERNAL (ORIF) FIXATION PATELLA  and application wound vac;  Surgeon: Corky Mull, MD;  Location: [facility];  Service: Orthopedics;  Laterality: Right;   TONSILLECTOMY     VAGINAL HYSTERECTOMY      Family History  Problem Relation Age of Onset   Heart attack Father  x2   Heart disease Sister    Kidney disease Sister    Diabetes Sister    Heart attack Brother    Diabetes Brother    Heart attack Brother    Breast cancer Neg Hx     Social History   Tobacco Use   Smoking status: Never   Smokeless tobacco: Never   Tobacco comments:    smoking cessation materials not required  Substance Use Topics   Alcohol use: No     Current Outpatient Medications:    alendronate (FOSAMAX) 35 MG tablet,  TAKE 1 TABLET BY MOUTH EVERY 7 DAYS. TAKE WITH WATER ON AN EMPTY STOMACH., Disp: 12 tablet, Rfl: 1   azelastine (ASTELIN) 0.1 % nasal spray, PLACE 2 SPRAYS INTO BOTH NOSTRILS 2 (TWO) TIMES DAILY. USE IN EACH NOSTRIL AS DIRECTED, Disp: 30 mL, Rfl: 4   cholecalciferol (VITAMIN D) 1000 units tablet, Take 1 capsule by mouth daily., Disp: , Rfl:    famotidine (PEPCID) 40 MG tablet, TAKE 1 TABLET BY MOUTH EVERY DAY, Disp: 90 tablet, Rfl: 0   Fish Oil-Cholecalciferol (FISH OIL + D3) 1200-1000 MG-UNIT CAPS, Take 1 capsule by mouth daily., Disp: , Rfl:    fluticasone (FLONASE) 50 MCG/ACT nasal spray, Place 1 spray into both nostrils daily., Disp: 16 g, Rfl: 2   irbesartan (AVAPRO) 75 MG tablet, Take 1 tablet (75 mg total) by mouth daily., Disp: 90 tablet, Rfl: 1   Multiple Vitamin (MULTIVITAMIN) tablet, Take 1 tablet by mouth daily., Disp: , Rfl:    Multiple Vitamins-Minerals (HAIR SKIN NAILS PO), Take by mouth., Disp: , Rfl:    pravastatin (PRAVACHOL) 80 MG tablet, TAKE 1 TABLET BY MOUTH EVERY DAY, Disp: 90 tablet, Rfl: 3   SYNTHROID 75 MCG tablet, TAKE 1 TABLET (75 MCG TOTAL) BY MOUTH DAILY BEFORE BREAKFAST. ONE DAILY AND SKIP SUNDAYS, Disp: 90 tablet, Rfl: 2   timolol (TIMOPTIC) 0.5 % ophthalmic solution, Administer 1 drop into both eyes 1 (one) time each day in the morning, Disp: , Rfl:    topiramate (TOPAMAX) 50 MG tablet, Take 1 tablet (50 mg total) by mouth every evening., Disp: 90 tablet, Rfl: 1   vitamin C (ASCORBIC ACID) 500 MG tablet, Take 500 mg by mouth daily., Disp: , Rfl:    Calcium Polycarbophil (FIBERCON PO), Take 1 tablet by mouth at bedtime.  (Patient not taking: Reported on 08/25/2020), Disp: , Rfl:   Allergies  Allergen Reactions   Codeine Other (See Comments)    Patient had insomnia and nausea   Oxycodone Other (See Comments)    Made her "Loopy"    I personally reviewed active problem list, medication list, allergies, family history, social history, health maintenance with the  patient/caregiver today.   ROS  Constitutional: Negative for fever , positive for weight change.  Respiratory: Negative for cough and shortness of breath.   Cardiovascular: Negative for chest pain or palpitations.  Gastrointestinal: Negative for abdominal pain, no bowel changes.  Musculoskeletal: Negative for gait problem or joint swelling.  Skin: Negative for rash.  Neurological: Negative for dizziness or headache.  No other specific complaints in a complete review of systems (except as listed in HPI above).   Objective  Vitals:   08/25/20 1102  BP: 122/68  Pulse: 68  Resp: 16  Temp: 97.9 F (36.6 C)  SpO2: 99%  Weight: 150 lb (68 kg)  Height: '5\' 4"'$  (1.626 m)    Body mass index is 25.75 kg/m.  Physical Exam  Constitutional: Patient  appears well-developed and well-nourished.  No distress.  HEENT: head atraumatic, normocephalic, pupils equal and reactive to light, neck supple Cardiovascular: Normal rate, regular rhythm and normal heart sounds.  No murmur heard. No BLE edema. Pulmonary/Chest: Effort normal and breath sounds normal. No respiratory distress. Abdominal: Soft.  There is no tenderness. Psychiatric: Patient has a normal mood and affect. behavior is normal. Judgment and thought content normal.   PHQ2/9: Depression screen [facility] 2/9 08/25/2020 08/11/2020 02/19/2020 08/19/2019 07/23/2019  Decreased Interest 0 0 0 0 0  Down, Depressed, Hopeless 0 0 0 0 0  PHQ - 2 Score 0 0 0 0 0  Altered sleeping - - 0 0 -  Tired, decreased energy - - 0 0 -  Change in appetite - - 0 0 -  Feeling bad or failure about yourself  - - 0 0 -  Trouble concentrating - - 0 0 -  Moving slowly or fidgety/restless - - 0 0 -  Suicidal thoughts - - 0 - -  PHQ-9 Score - - 0 0 -  Difficult doing work/chores - - Not difficult at all - -  Some recent data might be hidden    phq 9 is negative   Fall Risk: Fall Risk  08/25/2020 08/11/2020 02/19/2020 08/19/2019 07/23/2019  Falls in the past year? 0 0 0 0 0   Comment - - - - -  Number falls in past yr: 0 0 0 0 0  Injury with Fall? 0 0 0 0 0  Comment - - - - -  Risk for fall due to : No Fall Risks No Fall Risks - - No Fall Risks  Risk for fall due to: Comment - - - - -  Follow up Falls prevention discussed Falls prevention discussed - - Falls prevention discussed      Functional Status Survey: Is the patient deaf or have difficulty hearing?: No Does the patient have difficulty seeing, even when wearing glasses/contacts?: No Does the patient have difficulty concentrating, remembering, or making decisions?: No Does the patient have difficulty walking or climbing stairs?: No Does the patient have difficulty dressing or bathing?: No Does the patient have difficulty doing errands alone such as visiting a doctor's office or shopping?: No    Assessment & Plan  1. Osteopenia, unspecified location  - alendronate (FOSAMAX) 35 MG tablet; Take with a full glass of water on an empty stomach.  Dispense: 12 tablet; Refill: 1  2. Hypertension, benign  - irbesartan (AVAPRO) 75 MG tablet; Take 1 tablet (75 mg total) by mouth daily.  Dispense: 90 tablet; Refill: 1  3. Stage 3a chronic kidney disease (HCC)  - irbesartan (AVAPRO) 75 MG tablet; Take 1 tablet (75 mg total) by mouth daily.  Dispense: 90 tablet; Refill: 1  4. Benign hypertension with chronic kidney disease, stage III (HCC)  - irbesartan (AVAPRO) 75 MG tablet; Take 1 tablet (75 mg total) by mouth daily.  Dispense: 90 tablet; Refill: 1  5. GERD without esophagitis   6. Migraine without aura and without status migrainosus, not intractable   7. Adult hypothyroidism   8. Dyslipidemia   9. Secondary hyperparathyroidism of renal origin ([place])   10. Need for shingles vaccine  - Zoster Vaccine Adjuvanted [facility]) injection; Inject 0.5 mLs into the muscle once for 1 dose.  Dispense: 0.5 mL; Refill: 1  11. Colon cancer screening  - Cologuard

## 2020-08-25 ENCOUNTER — Encounter: Payer: Self-pay | Admitting: Family Medicine

## 2020-08-25 ENCOUNTER — Ambulatory Visit (INDEPENDENT_AMBULATORY_CARE_PROVIDER_SITE_OTHER): Payer: Medicare HMO | Admitting: Family Medicine

## 2020-08-25 ENCOUNTER — Other Ambulatory Visit: Payer: Self-pay

## 2020-08-25 VITALS — BP 122/68 | HR 68 | Temp 97.9°F | Resp 16 | Ht 64.0 in | Wt 150.0 lb

## 2020-08-25 DIAGNOSIS — N2581 Secondary hyperparathyroidism of renal origin: Secondary | ICD-10-CM

## 2020-08-25 DIAGNOSIS — Z23 Encounter for immunization: Secondary | ICD-10-CM

## 2020-08-25 DIAGNOSIS — Z1211 Encounter for screening for malignant neoplasm of colon: Secondary | ICD-10-CM

## 2020-08-25 DIAGNOSIS — I10 Essential (primary) hypertension: Secondary | ICD-10-CM

## 2020-08-25 DIAGNOSIS — N183 Chronic kidney disease, stage 3 unspecified: Secondary | ICD-10-CM

## 2020-08-25 DIAGNOSIS — E039 Hypothyroidism, unspecified: Secondary | ICD-10-CM | POA: Diagnosis not present

## 2020-08-25 DIAGNOSIS — E785 Hyperlipidemia, unspecified: Secondary | ICD-10-CM | POA: Diagnosis not present

## 2020-08-25 DIAGNOSIS — I129 Hypertensive chronic kidney disease with stage 1 through stage 4 chronic kidney disease, or unspecified chronic kidney disease: Secondary | ICD-10-CM

## 2020-08-25 DIAGNOSIS — G43009 Migraine without aura, not intractable, without status migrainosus: Secondary | ICD-10-CM

## 2020-08-25 DIAGNOSIS — K219 Gastro-esophageal reflux disease without esophagitis: Secondary | ICD-10-CM | POA: Diagnosis not present

## 2020-08-25 DIAGNOSIS — N1831 Chronic kidney disease, stage 3a: Secondary | ICD-10-CM | POA: Diagnosis not present

## 2020-08-25 DIAGNOSIS — M8580 Other specified disorders of bone density and structure, unspecified site: Secondary | ICD-10-CM

## 2020-08-25 MED ORDER — SHINGRIX 50 MCG/0.5ML IM SUSR
0.5000 mL | Freq: Once | INTRAMUSCULAR | 1 refills | Status: AC
Start: 2020-08-25 — End: 2020-08-25

## 2020-08-25 MED ORDER — ALENDRONATE SODIUM 35 MG PO TABS: ORAL_TABLET | ORAL | 1 refills | Status: DC

## 2020-08-25 MED ORDER — IRBESARTAN 75 MG PO TABS: 75.0000 mg | ORAL_TABLET | Freq: Every day | ORAL | 1 refills | Status: DC

## 2020-09-02 DIAGNOSIS — Z1211 Encounter for screening for malignant neoplasm of colon: Secondary | ICD-10-CM | POA: Diagnosis not present

## 2020-09-07 DIAGNOSIS — N1831 Chronic kidney disease, stage 3a: Secondary | ICD-10-CM | POA: Diagnosis not present

## 2020-09-07 DIAGNOSIS — N2581 Secondary hyperparathyroidism of renal origin: Secondary | ICD-10-CM | POA: Diagnosis not present

## 2020-09-07 DIAGNOSIS — I129 Hypertensive chronic kidney disease with stage 1 through stage 4 chronic kidney disease, or unspecified chronic kidney disease: Secondary | ICD-10-CM | POA: Diagnosis not present

## 2020-09-09 LAB — COLOGUARD: Cologuard: NEGATIVE

## 2020-09-10 DIAGNOSIS — N1831 Chronic kidney disease, stage 3a: Secondary | ICD-10-CM | POA: Diagnosis not present

## 2020-09-10 DIAGNOSIS — I129 Hypertensive chronic kidney disease with stage 1 through stage 4 chronic kidney disease, or unspecified chronic kidney disease: Secondary | ICD-10-CM | POA: Diagnosis not present

## 2020-09-10 DIAGNOSIS — N2581 Secondary hyperparathyroidism of renal origin: Secondary | ICD-10-CM | POA: Diagnosis not present

## 2020-09-26 ENCOUNTER — Other Ambulatory Visit: Payer: Self-pay | Admitting: Family Medicine

## 2020-10-20 ENCOUNTER — Other Ambulatory Visit: Payer: Self-pay | Admitting: Family Medicine

## 2020-10-20 DIAGNOSIS — K219 Gastro-esophageal reflux disease without esophagitis: Secondary | ICD-10-CM

## 2020-10-20 DIAGNOSIS — G43009 Migraine without aura, not intractable, without status migrainosus: Secondary | ICD-10-CM

## 2020-10-25 IMAGING — CT CT ABD-PELV W/ CM
2 of 5 series · 16 of 46 positions shown, 18 images · IV contrast (APPLIED)
Comparison: CT abdomen and pelvis 10/12/2005.

CLINICAL DATA: Abdominal pain and cramping for 3 days.

EXAM:
CT ABDOMEN AND PELVIS WITH CONTRAST
TECHNIQUE: Multidetector CT imaging of the abdomen and pelvis was performed
using the standard protocol following bolus administration of
intravenous contrast.
CONTRAST:  100 mL OMNIPAQUE IOHEXOL 300 MG/ML  SOLN

[Series 2: routine abd/pel with · axial · 0.83mm/px · z∈[-291,+84]mm · 13 of 85 slices shown, 15 images]
[im 5/85  soft-tissue]
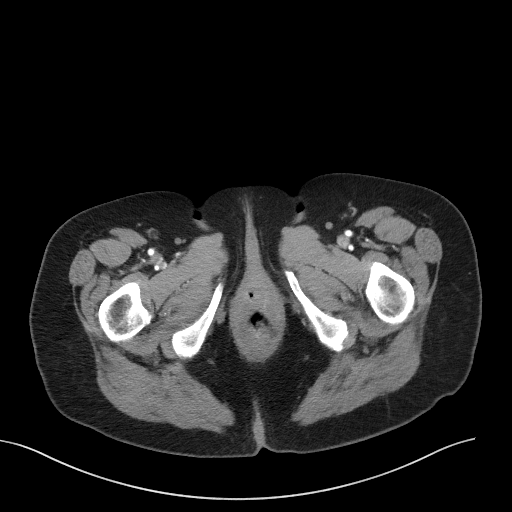
[im 5/85  bone]
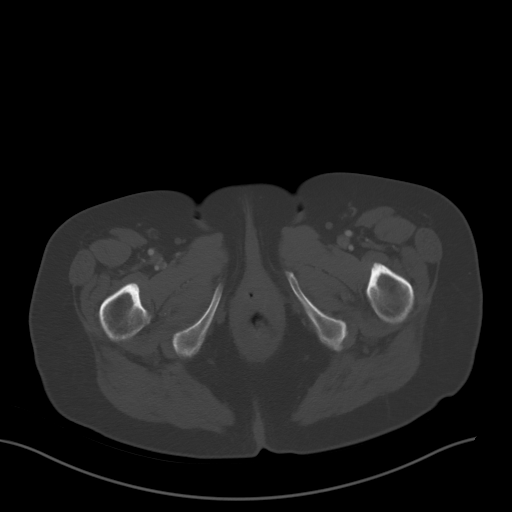
[im 10/85  soft-tissue]
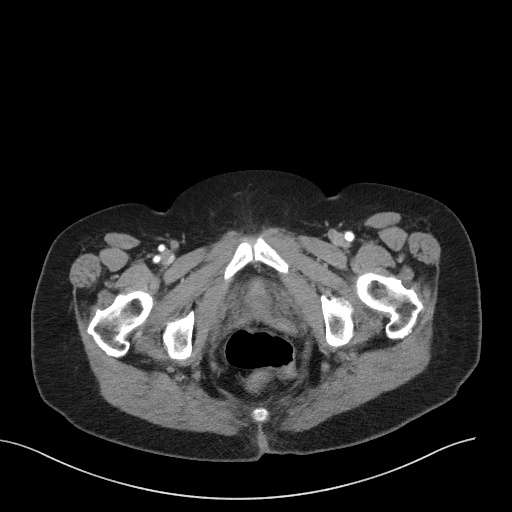
[im 20/85  soft-tissue]
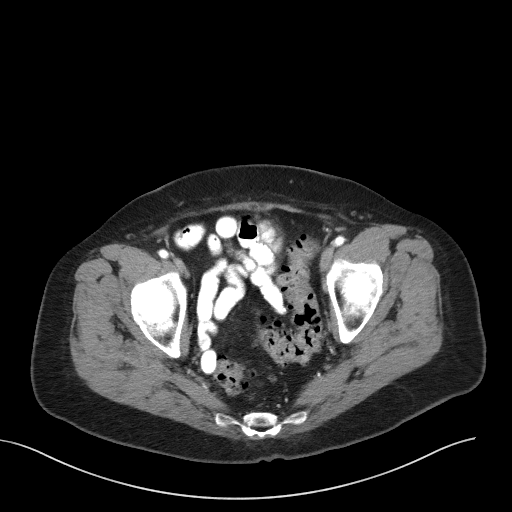
[im 25/85  soft-tissue]
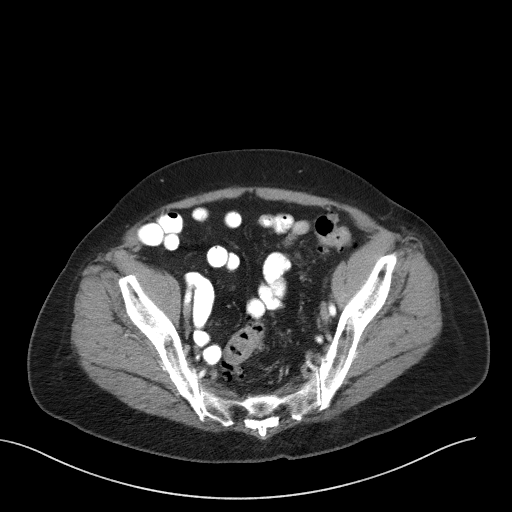
[im 30/85  soft-tissue]
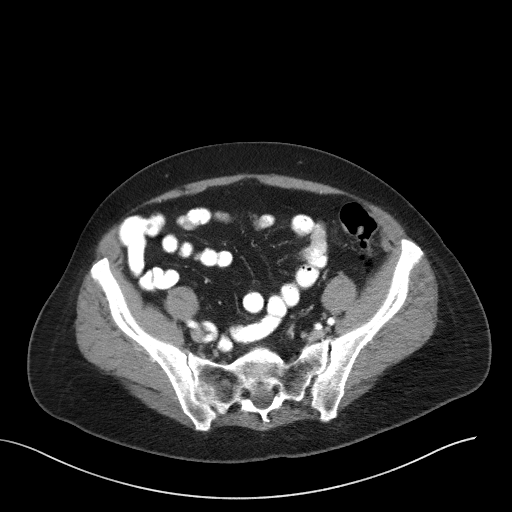
[im 35/85  soft-tissue]
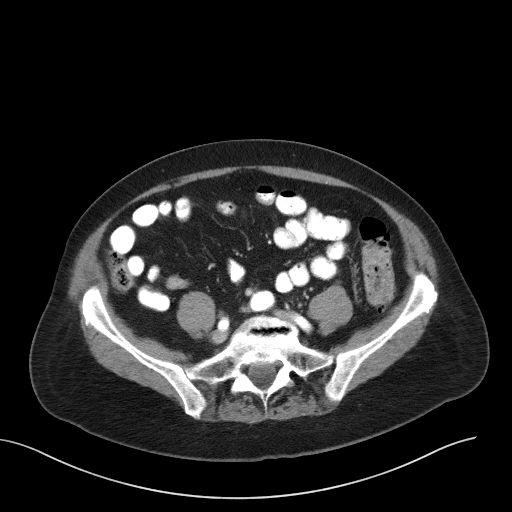
[im 45/85  soft-tissue]
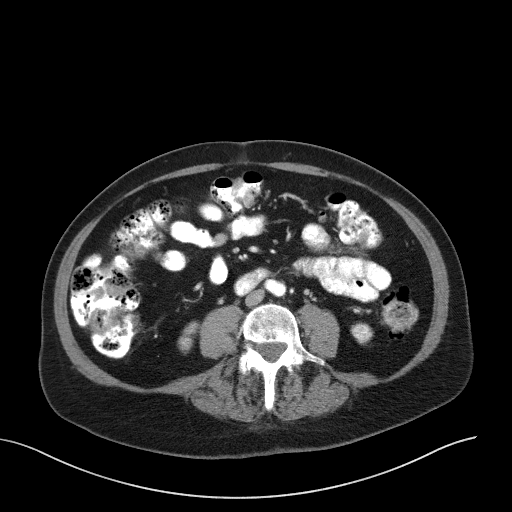
[im 50/85  soft-tissue]
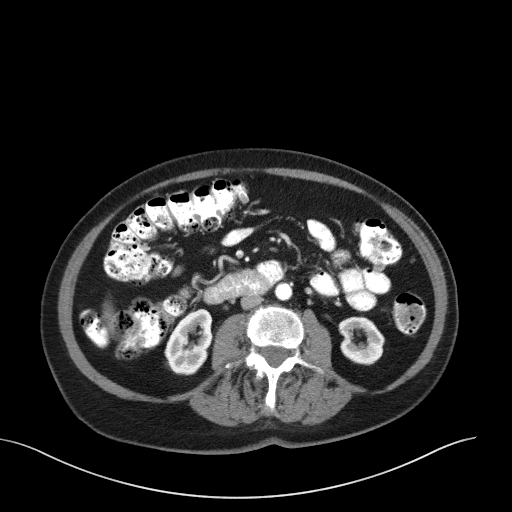
[im 55/85  soft-tissue]
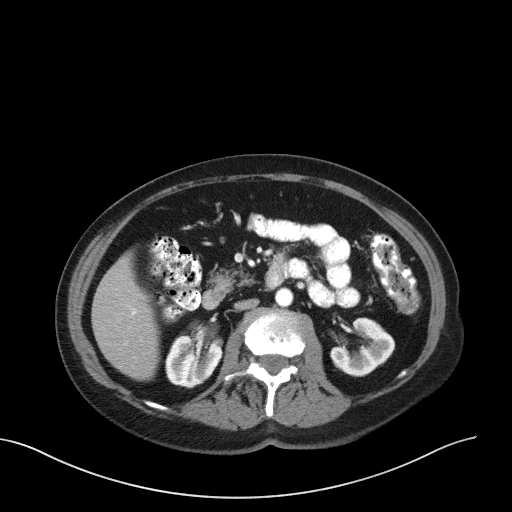
[im 55/85  bone]
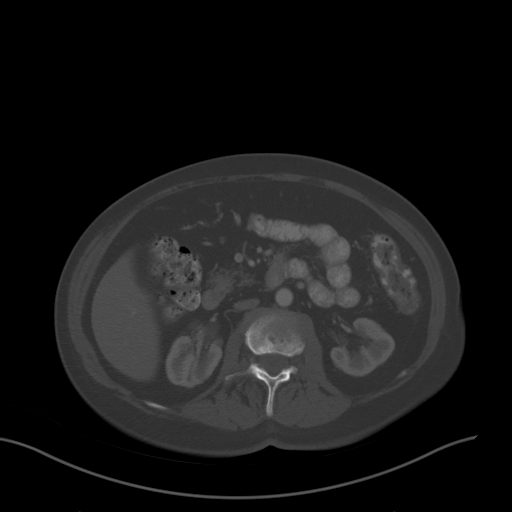
[im 60/85  soft-tissue]
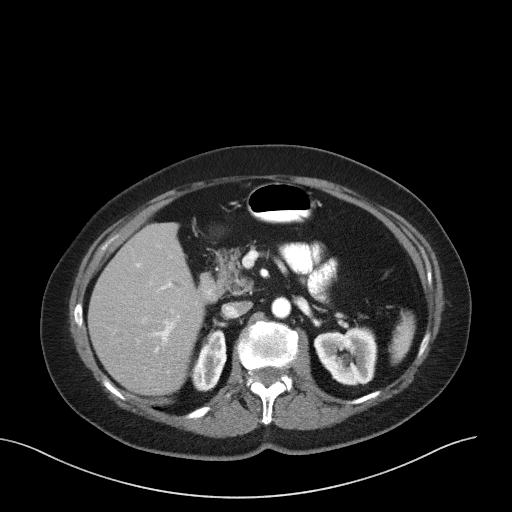
[im 65/85  soft-tissue]
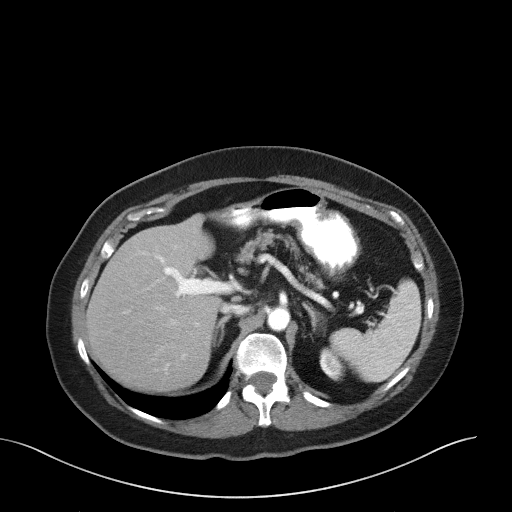
[im 75/85  soft-tissue]
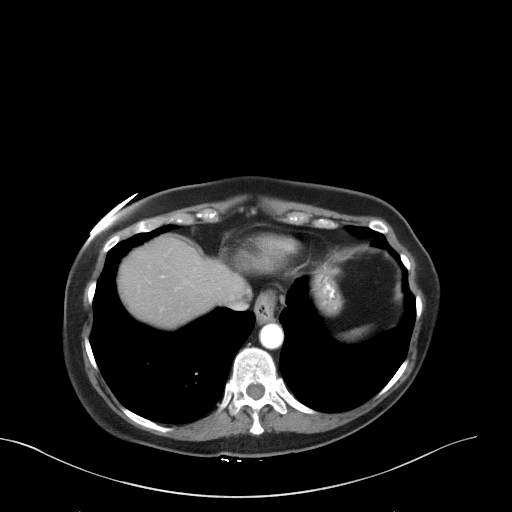
[im 80/85  soft-tissue]
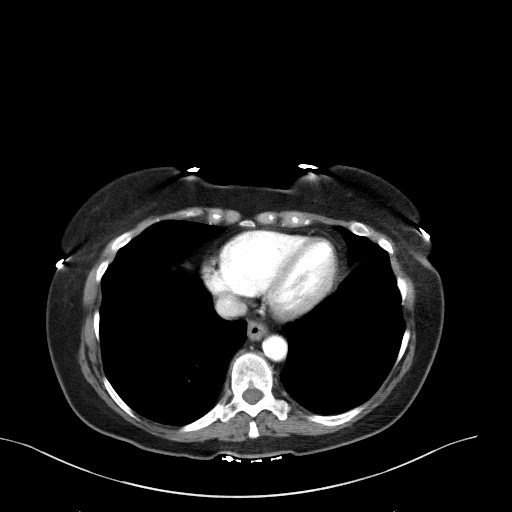

[Series 5: coronal st · coronal · 0.76mm/px · 3 of 92 slices shown]
[im 31/92  soft-tissue]
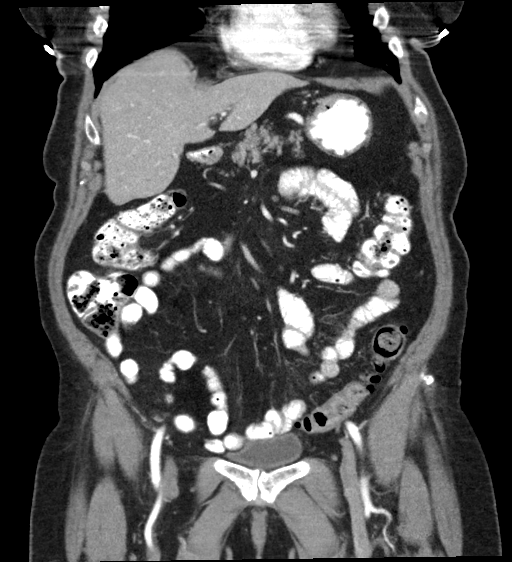
[im 41/92  soft-tissue]
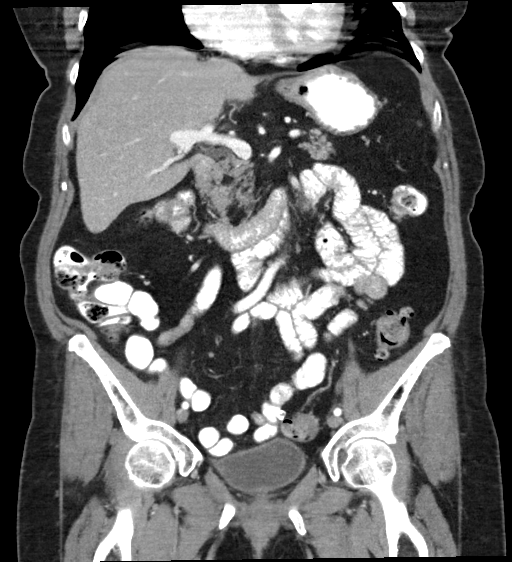
[im 51/92  soft-tissue]
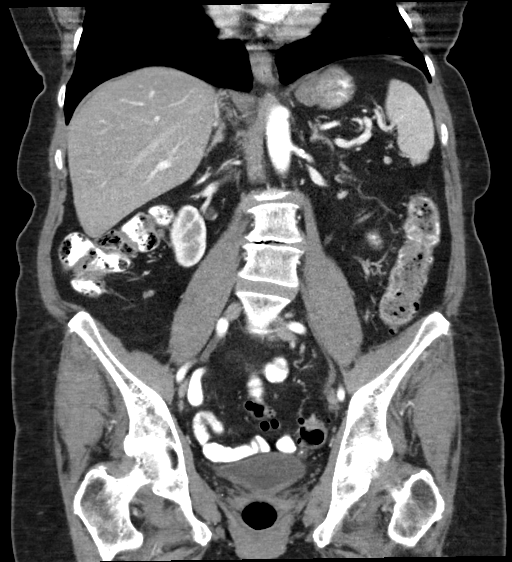

[16 of 46 positions shown; findings below may reference images not displayed]

FINDINGS: Lower chest: Lung bases are clear. No pleural or pericardial
effusion. Heart size is normal.

Hepatobiliary: No focal liver abnormality is seen. Status post
cholecystectomy. No biliary dilatation. Mild low-attenuation of the
liver is compatible with fatty infiltration.

Pancreas: Unremarkable. No pancreatic ductal dilatation or
surrounding inflammatory changes. Mild to moderate pancreatic
atrophy has progressed somewhat since the prior exam.

Spleen: Normal in size without focal abnormality.

Adrenals/Urinary Tract: Adrenal glands are unremarkable. Kidneys are
normal, without renal calculi, focal lesion, or hydronephrosis.
Bladder is unremarkable.

Stomach/Bowel: Small hiatal hernia is noted. Stomach is within
normal limits. The appendix has been removed. No evidence of bowel
wall thickening, distention, or inflammatory changes. Descending and
sigmoid colon diverticulosis noted.

Vascular/Lymphatic: No significant vascular findings are present. No
enlarged abdominal or pelvic lymph nodes.

Reproductive: Status post hysterectomy. No adnexal masses.

Other: None.

Musculoskeletal: No acute or focal abnormality. Convex left lumbar
scoliosis and multilevel degenerative change noted.
IMPRESSION: No acute abnormality abdomen or pelvis. No finding to explain the
patient's symptoms.

Diverticulosis without diverticulitis.

Mild fatty infiltration of the liver.

Small hiatal hernia.

Lumbar scoliosis and degenerative disease.

## 2020-10-30 DIAGNOSIS — D485 Neoplasm of uncertain behavior of skin: Secondary | ICD-10-CM | POA: Diagnosis not present

## 2020-10-30 DIAGNOSIS — L821 Other seborrheic keratosis: Secondary | ICD-10-CM | POA: Diagnosis not present

## 2020-10-30 DIAGNOSIS — Z85820 Personal history of malignant melanoma of skin: Secondary | ICD-10-CM | POA: Diagnosis not present

## 2020-10-30 DIAGNOSIS — Z85828 Personal history of other malignant neoplasm of skin: Secondary | ICD-10-CM | POA: Diagnosis not present

## 2020-10-30 DIAGNOSIS — Z08 Encounter for follow-up examination after completed treatment for malignant neoplasm: Secondary | ICD-10-CM | POA: Diagnosis not present

## 2020-10-30 DIAGNOSIS — L739 Follicular disorder, unspecified: Secondary | ICD-10-CM | POA: Diagnosis not present

## 2020-11-04 ENCOUNTER — Ambulatory Visit: Payer: Self-pay | Admitting: *Deleted

## 2020-11-04 ENCOUNTER — Ambulatory Visit (INDEPENDENT_AMBULATORY_CARE_PROVIDER_SITE_OTHER): Payer: Medicare HMO | Admitting: Nurse Practitioner

## 2020-11-04 ENCOUNTER — Other Ambulatory Visit: Payer: Self-pay

## 2020-11-04 ENCOUNTER — Encounter: Payer: Self-pay | Admitting: Nurse Practitioner

## 2020-11-04 VITALS — BP 126/74 | HR 78 | Temp 99.1°F | Resp 14 | Ht 64.0 in | Wt 153.6 lb

## 2020-11-04 DIAGNOSIS — R1909 Other intra-abdominal and pelvic swelling, mass and lump: Secondary | ICD-10-CM

## 2020-11-04 DIAGNOSIS — Z23 Encounter for immunization: Secondary | ICD-10-CM

## 2020-11-04 NOTE — Progress Notes (Signed)
   BP 126/74   Pulse 78   Temp 99.1 F (37.3 C) (Oral)   Resp 14   Ht 5\' 4"  (1.626 m)   Wt 153 lb 9.6 oz (69.7 kg)   LMP 01/11/1987 (Approximate)   SpO2 97%   BMI 26.37 kg/m    Subjective:    Patient ID: Theresa Hall, female    DOB: 01-27-1940, 80 y.o.   MRN: [id number]  HPI: Theresa Hall is a 80 y.o. female, here alone  Chief Complaint  Patient presents with   Cyst    Firm nodule on right side of abdomen, painful when taking a breath    Abdominal mass: She says she noticed on Sunday.  She says she was in the shower and noticed a mass on her right side mid abdomen.  She denies any heavy lifting. She says it hurts when she inhales.  She says that it is firm.  She says she can only feel it when she is standing.  She denies any changes in bowel movements, no fever, no tenderness.    Dr. Ky Barban did a POC ultrasound.  No sign of hernia, no abnormalities found on liver or kidney, appears to be scar tissue from cholecystectomy.   Relevant past medical, surgical, family and social history reviewed and updated as indicated. Interim medical history since our last visit reviewed. Allergies and medications reviewed and updated.  Review of Systems  Constitutional: Negative for fever or weight change.  Respiratory: Negative for cough and shortness of breath.   Cardiovascular: Negative for chest pain or palpitations.  Gastrointestinal: Negative for abdominal pain, no bowel changes. Positive for mass on right mid abdomen. Musculoskeletal: Negative for gait problem or joint swelling.  Skin: Negative for rash.  Neurological: Negative for dizziness or headache.  No other specific complaints in a complete review of systems (except as listed in HPI above).      Objective:    BP 126/74   Pulse 78   Temp 99.1 F (37.3 C) (Oral)   Resp 14   Ht 5\' 4"  (1.626 m)   Wt 153 lb 9.6 oz (69.7 kg)   LMP 01/11/1987 (Approximate)   SpO2 97%   BMI 26.37 kg/m   Wt Readings from Last 3  Encounters:  11/04/20 153 lb 9.6 oz (69.7 kg)  08/25/20 150 lb (68 kg)  02/19/20 157 lb 3.2 oz (71.3 kg)    Physical Exam  Constitutional: Patient appears well-developed and well-nourished.  No distress.  HEENT: head atraumatic, normocephalic, pupils equal and reactive to light, neck supple Cardiovascular: Normal rate, regular rhythm and normal heart sounds.  No murmur heard. No BLE edema. Pulmonary/Chest: Effort normal and breath sounds normal. No respiratory distress. Abdominal: Soft.  There is no tenderness. Mass felt upon surgical scar when having patient stand.  Psychiatric: Patient has a normal mood and affect. behavior is normal. Judgment and thought content normal.   Results for orders placed or performed in visit on 08/25/20  Cologuard  Result Value Ref Range   Cologuard Negative Negative      Assessment & Plan:   1. Abdominal mass of other site -discussed ultrasound results of POC ultrasound -recommend getting massage to help loosen scar tissue -will return if no improvement  2. Need for influenza vaccination  - Flu Vaccine QUAD High Dose(Fluad)   Follow up plan: Return if symptoms worsen or fail to improve.

## 2020-11-04 NOTE — Telephone Encounter (Signed)
Reason for Disposition  [1] MILD-MODERATE pain AND [2] constant AND [3] present > 2 hours  Answer Assessment - Initial Assessment Questions 1. LOCATION: "Where does it hurt?"      Bulge in abdomen- to R of bellybutton 2. RADIATION: "Does the pain shoot anywhere else?" (e.g., chest, back)     no 3. ONSET: "When did the pain begin?" (e.g., minutes, hours or days ago)      Sunday morning 4. SUDDEN: "Gradual or sudden onset?"     Sudden 5. PATTERN "Does the pain come and go, or is it constant?"    - If constant: "Is it getting better, staying the same, or worsening?"      (Note: Constant means the pain never goes away completely; most serious pain is constant and it progresses)     - If intermittent: "How long does it last?" "Do you have pain now?"     (Note: Intermittent means the pain goes away completely between bouts)     hurts when patient takes deep breath- comes and goes 6. SEVERITY: "How bad is the pain?"  (e.g., Scale 1-10; mild, moderate, or severe)    - MILD (1-3): doesn't interfere with normal activities, abdomen soft and not tender to touch     - MODERATE (4-7): interferes with normal activities or awakens from sleep, abdomen tender to touch     - SEVERE (8-10): excruciating pain, doubled over, unable to do any normal activities       Mild/moderate 7. RECURRENT SYMPTOM: "Have you ever had this type of stomach pain before?" If Yes, ask: "When was the last time?" and "What happened that time?"      no 8. AGGRAVATING FACTORS: "Does anything seem to cause this pain?" (e.g., foods, stress, alcohol)     Worse with pressure 9. CARDIAC SYMPTOMS: "Do you have any of the following symptoms: chest pain, difficulty breathing, sweating, nausea?"     Deep breath causes pain 10. OTHER SYMPTOMS: "Do you have any other symptoms?" (e.g., back pain, diarrhea, fever, urination pain, vomiting)       no 11. PREGNANCY: "Is there any chance you are pregnant?" "When was your last menstrual period?"        na  Protocols used: Abdominal Pain - Upper-A-AH

## 2020-11-04 NOTE — Telephone Encounter (Signed)
Patient is calling to report she has "lump" swollen area to R of her bellybutton. Patient states it appeared Sunday. Patient states it is painful when she takes a deep breath and slightly painful with palpitation. Patient has never been diagnosed with hernia. Call to office- appointment scheduled.

## 2020-11-27 ENCOUNTER — Other Ambulatory Visit: Payer: Self-pay | Admitting: Family Medicine

## 2020-11-27 DIAGNOSIS — Z1231 Encounter for screening mammogram for malignant neoplasm of breast: Secondary | ICD-10-CM

## 2020-12-25 ENCOUNTER — Other Ambulatory Visit: Payer: Self-pay | Admitting: Family Medicine

## 2021-01-21 ENCOUNTER — Other Ambulatory Visit: Payer: Self-pay | Admitting: Family Medicine

## 2021-01-21 DIAGNOSIS — K219 Gastro-esophageal reflux disease without esophagitis: Secondary | ICD-10-CM

## 2021-01-21 NOTE — Telephone Encounter (Signed)
Requested Prescriptions  Pending Prescriptions Disp Refills   famotidine (PEPCID) 40 MG tablet [Pharmacy Med Name: FAMOTIDINE 40 MG TABLET] 90 tablet 1    Sig: TAKE 1 TABLET BY MOUTH EVERY DAY     Gastroenterology:  H2 Antagonists Passed - 01/21/2021  1:30 AM      Passed - Valid encounter within last 12 months    Recent Outpatient Visits          2 months ago Abdominal mass of other site   [place], FNP   4 months ago Migraine without aura and without status migrainosus, not intractable   [facility] [place], Drue Stager, MD   11 months ago Benign hypertension with chronic kidney disease, stage III [facility])   [facility] [place], Drue Stager, MD   1 year ago Migraine without aura and without status migrainosus, not intractable   [facility] Steele Sizer, MD   1 year ago Lower abdominal pain   [facility] [place], MD      Future Appointments            In 1 month Ancil Boozer, Drue Stager, MD [facility], [place]   In 6 months  [facility], [facility]

## 2021-02-16 ENCOUNTER — Ambulatory Visit
Admission: RE | Admit: 2021-02-16 | Discharge: 2021-02-16 | Disposition: A | Discharge status: Home / Self Care | Payer: Medicare HMO | Source: Ambulatory Visit | Attending: Family Medicine | Admitting: Family Medicine

## 2021-02-16 ENCOUNTER — Other Ambulatory Visit: Payer: Self-pay

## 2021-02-16 ENCOUNTER — Other Ambulatory Visit: Payer: Self-pay | Admitting: Family Medicine

## 2021-02-16 DIAGNOSIS — Z1231 Encounter for screening mammogram for malignant neoplasm of breast: Secondary | ICD-10-CM | POA: Diagnosis not present

## 2021-02-16 DIAGNOSIS — Z780 Asymptomatic menopausal state: Secondary | ICD-10-CM | POA: Insufficient documentation

## 2021-02-16 DIAGNOSIS — M85852 Other specified disorders of bone density and structure, left thigh: Secondary | ICD-10-CM | POA: Diagnosis not present

## 2021-02-16 DIAGNOSIS — M85832 Other specified disorders of bone density and structure, left forearm: Secondary | ICD-10-CM | POA: Diagnosis not present

## 2021-02-17 DIAGNOSIS — H401131 Primary open-angle glaucoma, bilateral, mild stage: Secondary | ICD-10-CM | POA: Diagnosis not present

## 2021-02-19 ENCOUNTER — Other Ambulatory Visit: Payer: Self-pay | Admitting: Family Medicine

## 2021-02-19 NOTE — Telephone Encounter (Signed)
Requested medications are due for refill today.  yes  Requested medications are on the active medications list.  yes  Last refill. 05/20/2020 90/2 refills  Future visit scheduled.   yes  Notes to clinic.  Labs are expired.    Requested Prescriptions  Pending Prescriptions Disp Refills   SYNTHROID 75 MCG tablet [Pharmacy Med Name: SYNTHROID 75 MCG TABLET] 90 tablet 2    Sig: TAKE 1 TABLET (75 MCG TOTAL) BY MOUTH DAILY BEFORE BREAKFAST. ONE DAILY AND SKIP SUNDAYS     Endocrinology:  Hypothyroid Agents Failed - 02/19/2021  1:36 AM      Failed - TSH in normal range and within 360 days    TSH  Date Value Ref Range Status  02/19/2020 3.08 0.40 - 4.50 mIU/L Final          Passed - Valid encounter within last 12 months    Recent Outpatient Visits           3 months ago Abdominal mass of other site   [facility] Serafina Royals F, FNP   5 months ago Migraine without aura and without status migrainosus, not intractable   [facility] [place], Drue Stager, MD   1 year ago Benign hypertension with chronic kidney disease, stage III [facility])   [facility] [place], Drue Stager, MD   1 year ago Migraine without aura and without status migrainosus, not intractable   [facility] Steele Sizer, MD   1 year ago Lower abdominal pain   [place], MD       Future Appointments             In 6 days Steele Sizer, MD [facility], [place]   In 5 months  [facility], [facility]

## 2021-02-24 NOTE — Progress Notes (Signed)
Name: Theresa Hall   MRN: [id number]    DOB: May 09, 1940   Date:02/25/2021       Progress Note  Subjective  Chief Complaint  Follow Up  HPI  HTN and she has a history of CKI stage III: reviewed last labs and GFR back in August was slightly lower at 24  she has a follow up with Dr.Kolluru in the next couple of weeks.  She is on low dose of Avapro no chest pain palpitation or dizziness, today her bp is towards low end of normal and she has been feeling tired lately, advised to come in for bp check with CMA - we may need to decrease dose of bp medication .  Denies pruritus or change in urinary frequency .She does not take NSAID's    Hypothyroidism: last TSH was at goal, continue current dose of 75 daily and skip Sunday's. She denies dry skin, hair loss, change in bowel movement or dysphagia. She has noticed some fatigue lately   Fatigue: states not as much energy lately, gradual over the past year she states unable to vacuum the entire house, she needs to take a break, she also needs to take a break when mopping the kitchen.It is not associated with diaphoresis, or nausea but feels SOB and resolves with rest.   We will check labs but also discussed referral to cardiologist    Migraine headaches: she had it for many years, she finally came off Topamax a few months ago and no episodes since.   Hyperlipidemia: taking medication and denies side effects, reviewed labs done LDL was 88 , we will recheck labs today    Osteopenia: discussed results, discussed high calcium and vitamin D diet and she has been taking low dose fosamax without side effects. Reviewed bone density done 02/2021 and showed mild improvement on femur, and slightly worse on wrist from -2.3 to -2.4    GERD/Hiatal hernia: she states symptoms are intermittent, she eats dinner around 7 pm, smaller portions , she states rarely has symptoms now, we advised pepcid every other day and she states some weeks she takes it daily but symptoms  have been controlled   Fatty liver: found on CT abdomen and pelvis..No longer having constipation or abdominal cramping since eating more fiber. We will recheck it today    Patient Active Problem List   Diagnosis Date Noted   Benign hypertensive kidney disease with chronic kidney disease 07/22/2019   BCC (basal cell carcinoma of skin) 10/16/2017   Secondary hyperparathyroidism of renal origin ([place]) 07/18/2017   Osteopenia 08/15/2016   Closed displaced comminuted fracture of right patella 08/20/2015   Reflux esophagitis 06/23/2014   HLD (hyperlipidemia) 06/23/2014   Palpitations 06/23/2014   Chronic kidney disease (CKD), stage III (moderate) ([place]) 08/24/2009   History of diverticulitis 05/14/2008   Adult hypothyroidism 12/04/2006   Migraine without aura 08/02/2006   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    Past Surgical History:  Procedure Laterality Date   APPENDECTOMY     BASAL CELL CARCINOMA EXCISION     BASAL CELL CARCINOMA EXCISION  2014   nasal   CATARACT EXTRACTION W/PHACO Left 07/03/2019   Procedure: CATARACT EXTRACTION PHACO AND INTRAOCULAR LENS PLACEMENT (IOC) LEFT KAHOOK DUAL BLADE GONIOTOMY 9.84 01:14.5 13.2%;  Surgeon: Leandrew Koyanagi, MD;  Location: [place];  Service: Ophthalmology;  Laterality: Left;   CATARACT EXTRACTION W/PHACO Right 08/07/2019   Procedure: CATARACT EXTRACTION PHACO AND INTRAOCULAR LENS PLACEMENT ([place]) RIGHT [place];  Surgeon:  Leandrew Koyanagi, MD;  Location: [place];  Service: Ophthalmology;  Laterality: Right;  6.46 0:59.4 10.9%   CHOLECYSTECTOMY     COLONOSCOPY     ORIF PATELLA Right 08/15/2015   Procedure: OPEN REDUCTION INTERNAL (ORIF) FIXATION PATELLA  and application wound vac;  Surgeon: Corky Mull, MD;  Location: [facility];  Service: Orthopedics;  Laterality: Right;   TONSILLECTOMY     VAGINAL HYSTERECTOMY      Family History  Problem Relation Age of Onset   Heart attack Father         x2   Heart disease Sister    Kidney disease Sister    Diabetes Sister    Heart attack Brother    Diabetes Brother    Heart attack Brother    Breast cancer Neg Hx     Social History   Tobacco Use   Smoking status: Never   Smokeless tobacco: Never   Tobacco comments:    smoking cessation materials not required  Substance Use Topics   Alcohol use: No     Current Outpatient Medications:    alendronate (FOSAMAX) 35 MG tablet, Take with a full glass of water on an empty stomach., Disp: 12 tablet, Rfl: 1   azelastine (ASTELIN) 0.1 % nasal spray, PLACE 2 SPRAYS INTO BOTH NOSTRILS 2 (TWO) TIMES DAILY. USE IN EACH NOSTRIL AS DIRECTED, Disp: 30 mL, Rfl: 4   cholecalciferol (VITAMIN D) 1000 units tablet, Take 1 capsule by mouth daily., Disp: , Rfl:    famotidine (PEPCID) 40 MG tablet, TAKE 1 TABLET BY MOUTH EVERY DAY, Disp: 90 tablet, Rfl: 1   Fish Oil-Cholecalciferol (FISH OIL + D3) 1200-1000 MG-UNIT CAPS, Take 1 capsule by mouth daily., Disp: , Rfl:    fluticasone (FLONASE) 50 MCG/ACT nasal spray, INSTILL 1 SPRAY INTO BOTH NOSTRILS DAILY, Disp: 48 mL, Rfl: 0   irbesartan (AVAPRO) 75 MG tablet, Take 1 tablet (75 mg total) by mouth daily., Disp: 90 tablet, Rfl: 1   Multiple Vitamin (MULTIVITAMIN) tablet, Take 1 tablet by mouth daily., Disp: , Rfl:    Multiple Vitamins-Minerals (HAIR SKIN NAILS PO), Take by mouth., Disp: , Rfl:    pravastatin (PRAVACHOL) 80 MG tablet, TAKE 1 TABLET BY MOUTH EVERY DAY, Disp: 90 tablet, Rfl: 3   SYNTHROID 75 MCG tablet, TAKE 1 TABLET (75 MCG TOTAL) BY MOUTH DAILY BEFORE BREAKFAST. ONE DAILY AND SKIP SUNDAYS, Disp: 30 tablet, Rfl: 0   timolol (TIMOPTIC) 0.5 % ophthalmic solution, Administer 1 drop into both eyes 1 (one) time each day in the morning, Disp: , Rfl:    vitamin C (ASCORBIC ACID) 500 MG tablet, Take 500 mg by mouth daily., Disp: , Rfl:    Zoster Vaccine Adjuvanted (SHINGRIX) injection, Inject 0.5 mLs into the muscle once for 1 dose., Disp: 0.5  mL, Rfl: 1  Allergies  Allergen Reactions   Codeine Other (See Comments)    Patient had insomnia and nausea   Oxycodone Other (See Comments)    Made her "Loopy"    I personally reviewed active problem list, medication list, allergies, family history, social history, health maintenance with the patient/caregiver today.   ROS  Constitutional: Negative for fever or weight change.  Respiratory: Negative for cough and shortness of breath.   Cardiovascular: Negative for chest pain or palpitations.  Gastrointestinal: Negative for abdominal pain, no bowel changes.  Musculoskeletal: Negative for gait problem or joint swelling.  Skin: Negative for rash.  Neurological: Negative for dizziness or headache.  No other  specific complaints in a complete review of systems (except as listed in HPI above).   Objective  Vitals:   02/25/21 1000  BP: 114/70  Pulse: 74  Resp: 16  SpO2: 98%  Weight: 156 lb (70.8 kg)  Height: 5\' 4"  (1.626 m)    Body mass index is 26.78 kg/m.  Physical Exam  Constitutional: Patient appears well-developed and well-nourished.  No distress.  HEENT: head atraumatic, normocephalic, pupils equal and reactive to light,  neck supple Cardiovascular: Normal rate, regular rhythm and normal heart sounds.  No murmur heard. No BLE edema. Pulmonary/Chest: Effort normal and breath sounds normal. No respiratory distress. Abdominal: Soft.  There is no tenderness. Psychiatric: Patient has a normal mood and affect. behavior is normal. Judgment and thought content normal.   PHQ2/9: Depression screen [facility] 2/9 02/25/2021 11/04/2020 08/25/2020 08/11/2020 02/19/2020  Decreased Interest 0 0 0 0 0  Down, Depressed, Hopeless 0 0 0 0 0  PHQ - 2 Score 0 0 0 0 0  Altered sleeping 0 - - - 0  Tired, decreased energy 0 - - - 0  Change in appetite 0 - - - 0  Feeling bad or failure about yourself  0 - - - 0  Trouble concentrating 0 - - - 0  Moving slowly or fidgety/restless 0 - - - 0  Suicidal  thoughts 0 - - - 0  PHQ-9 Score 0 - - - 0  Difficult doing work/chores - - - - Not difficult at all  Some recent data might be hidden    phq 9 is negative    Functional Status Survey: Is the patient deaf or have difficulty hearing?: No Does the patient have difficulty seeing, even when wearing glasses/contacts?: No Does the patient have difficulty concentrating, remembering, or making decisions?: No Does the patient have difficulty walking or climbing stairs?: No Does the patient have difficulty dressing or bathing?: No Does the patient have difficulty doing errands alone such as visiting a doctor's office or shopping?: No    Assessment & Plan  1. Benign hypertension with chronic kidney disease, stage III (HCC)  - irbesartan (AVAPRO) 75 MG tablet; Take 1 tablet (75 mg total) by mouth daily.  Dispense: 90 tablet; Refill: 1  2. Need for shingles vaccine  - Zoster Vaccine Adjuvanted [facility]) injection; Inject 0.5 mLs into the muscle once for 1 dose.  Dispense: 0.5 mL; Refill: 1  3. Stage 3a chronic kidney disease (HCC)  - irbesartan (AVAPRO) 75 MG tablet; Take 1 tablet (75 mg total) by mouth daily.  Dispense: 90 tablet; Refill: 1  4. Hypertension, benign  - COMPLETE METABOLIC PANEL WITH GFR - CBC with Differential/Platelet - irbesartan (AVAPRO) 75 MG tablet; Take 1 tablet (75 mg total) by mouth daily.  Dispense: 90 tablet; Refill: 1  5. GERD without esophagitis   6. Migraine without aura and without status migrainosus, not intractable   7. Adult hypothyroidism  - TSH  8. Dyslipidemia  - Lipid panel  9. Fatty liver   10. Perennial allergic rhinitis with seasonal variation   11. Long-term use of high-risk medication   12. Hyperglycemia  - Hemoglobin A1c  13. Vitamin D deficiency  - VITAMIN D 25 Hydroxy (Vit-D Deficiency, Fractures)  14. Other fatigue  - Vitamin B12  15. 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 Take  with a full glass of water on an empty stomach.  Dispense: 12 tablet; Refill: 3

## 2021-02-25 ENCOUNTER — Encounter: Payer: Self-pay | Admitting: Family Medicine

## 2021-02-25 ENCOUNTER — Ambulatory Visit (INDEPENDENT_AMBULATORY_CARE_PROVIDER_SITE_OTHER): Payer: Medicare HMO | Admitting: Family Medicine

## 2021-02-25 VITALS — BP 114/70 | HR 74 | Resp 16 | Ht 64.0 in | Wt 156.0 lb

## 2021-02-25 DIAGNOSIS — R5383 Other fatigue: Secondary | ICD-10-CM | POA: Diagnosis not present

## 2021-02-25 DIAGNOSIS — R739 Hyperglycemia, unspecified: Secondary | ICD-10-CM | POA: Diagnosis not present

## 2021-02-25 DIAGNOSIS — G43009 Migraine without aura, not intractable, without status migrainosus: Secondary | ICD-10-CM | POA: Diagnosis not present

## 2021-02-25 DIAGNOSIS — I10 Essential (primary) hypertension: Secondary | ICD-10-CM | POA: Diagnosis not present

## 2021-02-25 DIAGNOSIS — J3089 Other allergic rhinitis: Secondary | ICD-10-CM | POA: Diagnosis not present

## 2021-02-25 DIAGNOSIS — E559 Vitamin D deficiency, unspecified: Secondary | ICD-10-CM | POA: Diagnosis not present

## 2021-02-25 DIAGNOSIS — Z79899 Other long term (current) drug therapy: Secondary | ICD-10-CM

## 2021-02-25 DIAGNOSIS — E785 Hyperlipidemia, unspecified: Secondary | ICD-10-CM

## 2021-02-25 DIAGNOSIS — K219 Gastro-esophageal reflux disease without esophagitis: Secondary | ICD-10-CM

## 2021-02-25 DIAGNOSIS — M810 Age-related osteoporosis without current pathological fracture: Secondary | ICD-10-CM

## 2021-02-25 DIAGNOSIS — N1831 Chronic kidney disease, stage 3a: Secondary | ICD-10-CM | POA: Diagnosis not present

## 2021-02-25 DIAGNOSIS — Z23 Encounter for immunization: Secondary | ICD-10-CM

## 2021-02-25 DIAGNOSIS — E538 Deficiency of other specified B group vitamins: Secondary | ICD-10-CM | POA: Diagnosis not present

## 2021-02-25 DIAGNOSIS — K760 Fatty (change of) liver, not elsewhere classified: Secondary | ICD-10-CM

## 2021-02-25 DIAGNOSIS — R0602 Shortness of breath: Secondary | ICD-10-CM

## 2021-02-25 DIAGNOSIS — N183 Chronic kidney disease, stage 3 unspecified: Secondary | ICD-10-CM

## 2021-02-25 DIAGNOSIS — E039 Hypothyroidism, unspecified: Secondary | ICD-10-CM

## 2021-02-25 DIAGNOSIS — I129 Hypertensive chronic kidney disease with stage 1 through stage 4 chronic kidney disease, or unspecified chronic kidney disease: Secondary | ICD-10-CM | POA: Diagnosis not present

## 2021-02-25 DIAGNOSIS — J302 Other seasonal allergic rhinitis: Secondary | ICD-10-CM

## 2021-02-25 MED ORDER — IRBESARTAN 75 MG PO TABS: 75.0000 mg | ORAL_TABLET | Freq: Every day | ORAL | 1 refills | Status: DC

## 2021-02-25 MED ORDER — ALENDRONATE SODIUM 35 MG PO TABS: 70.0000 mg | ORAL_TABLET | ORAL | 3 refills | Status: DC

## 2021-02-25 MED ORDER — SHINGRIX 50 MCG/0.5ML IM SUSR: 0.5000 mL | Freq: Once | INTRAMUSCULAR | 1 refills | Status: AC

## 2021-02-26 DIAGNOSIS — H401131 Primary open-angle glaucoma, bilateral, mild stage: Secondary | ICD-10-CM | POA: Diagnosis not present

## 2021-02-26 LAB — COMPLETE METABOLIC PANEL WITH GFR
AG Ratio: 1.8 (calc) (ref 1.0–2.5)
ALT: 23 U/L (ref 6–29)
AST: 20 U/L (ref 10–35)
Albumin: 4.8 g/dL (ref 3.6–5.1)
Alkaline phosphatase (APISO): 44 U/L (ref 37–153)
BUN/Creatinine Ratio: 19 (calc) (ref 6–22)
BUN: 21 mg/dL (ref 7–25)
CO2: 29 mmol/L (ref 20–32)
Calcium: 10.4 mg/dL (ref 8.6–10.4)
Chloride: 105 mmol/L (ref 98–110)
Creat: 1.12 mg/dL — ABNORMAL HIGH (ref 0.60–0.95)
Globulin: 2.7 g/dL (calc) (ref 1.9–3.7)
Glucose, Bld: 108 mg/dL — ABNORMAL HIGH (ref 65–99)
Potassium: 4.9 mmol/L (ref 3.5–5.3)
Sodium: 142 mmol/L (ref 135–146)
Total Bilirubin: 0.6 mg/dL (ref 0.2–1.2)
Total Protein: 7.5 g/dL (ref 6.1–8.1)
eGFR: 49 mL/min/{1.73_m2} — ABNORMAL LOW (ref >=60)

## 2021-02-26 LAB — CBC WITH DIFFERENTIAL/PLATELET
Absolute Monocytes: 450 cells/uL (ref 200–950)
Basophils Absolute: 70 cells/uL (ref 0–200)
Basophils Relative: 1.4 %
Eosinophils Absolute: 130 cells/uL (ref 15–500)
Eosinophils Relative: 2.6 %
HCT: 39.6 % (ref 35.0–45.0)
Hemoglobin: 13.3 g/dL (ref 11.7–15.5)
Lymphs Abs: 1905 cells/uL (ref 850–3900)
MCH: 31.1 pg (ref 27.0–33.0)
MCHC: 33.6 g/dL (ref 32.0–36.0)
MCV: 92.7 fL (ref 80.0–100.0)
MPV: 11 fL (ref 7.5–12.5)
Monocytes Relative: 9 %
Neutro Abs: 2445 cells/uL (ref 1500–7800)
Neutrophils Relative %: 48.9 %
Platelets: 219 10*3/uL (ref 140–400)
RBC: 4.27 10*6/uL (ref 3.80–5.10)
RDW: 12.4 % (ref 11.0–15.0)
Total Lymphocyte: 38.1 %
WBC: 5 10*3/uL (ref 3.8–10.8)

## 2021-02-26 LAB — LIPID PANEL
Cholesterol: 182 mg/dL (ref <200)
HDL: 61 mg/dL (ref >=50)
LDL Cholesterol (Calc): 93 mg/dL (calc)
Non-HDL Cholesterol (Calc): 121 mg/dL (calc) (ref <130)
Total CHOL/HDL Ratio: 3 (calc) (ref <5.0)
Triglycerides: 187 mg/dL — ABNORMAL HIGH (ref <150)

## 2021-02-26 LAB — VITAMIN D 25 HYDROXY (VIT D DEFICIENCY, FRACTURES): Vit D, 25-Hydroxy: 56 ng/mL (ref 30–100)

## 2021-02-26 LAB — TSH: TSH: 9.47 mIU/L — ABNORMAL HIGH (ref 0.40–4.50)

## 2021-02-26 LAB — HEMOGLOBIN A1C
Hgb A1c MFr Bld: 5.2 % of total Hgb (ref <5.7)
Mean Plasma Glucose: 103 mg/dL
eAG (mmol/L): 5.7 mmol/L

## 2021-02-26 LAB — VITAMIN B12: Vitamin B-12: 1090 pg/mL (ref 200–1100)

## 2021-03-01 ENCOUNTER — Other Ambulatory Visit: Payer: Self-pay | Admitting: Family Medicine

## 2021-03-04 ENCOUNTER — Other Ambulatory Visit: Payer: Self-pay

## 2021-03-17 DIAGNOSIS — N1831 Chronic kidney disease, stage 3a: Secondary | ICD-10-CM | POA: Diagnosis not present

## 2021-03-17 DIAGNOSIS — I129 Hypertensive chronic kidney disease with stage 1 through stage 4 chronic kidney disease, or unspecified chronic kidney disease: Secondary | ICD-10-CM | POA: Diagnosis not present

## 2021-03-17 DIAGNOSIS — N2581 Secondary hyperparathyroidism of renal origin: Secondary | ICD-10-CM | POA: Diagnosis not present

## 2021-03-22 ENCOUNTER — Other Ambulatory Visit: Payer: Self-pay | Admitting: Family Medicine

## 2021-03-23 ENCOUNTER — Other Ambulatory Visit: Payer: Self-pay | Admitting: Family Medicine

## 2021-03-23 DIAGNOSIS — M810 Age-related osteoporosis without current pathological fracture: Secondary | ICD-10-CM

## 2021-04-07 ENCOUNTER — Encounter: Payer: Self-pay | Admitting: Cardiovascular Disease

## 2021-04-07 ENCOUNTER — Other Ambulatory Visit: Payer: Self-pay

## 2021-04-07 ENCOUNTER — Ambulatory Visit: Payer: Medicare HMO | Admitting: Cardiovascular Disease

## 2021-04-07 VITALS — BP 146/78 | HR 65 | Ht 64.0 in | Wt 157.5 lb

## 2021-04-07 DIAGNOSIS — R0602 Shortness of breath: Secondary | ICD-10-CM | POA: Diagnosis not present

## 2021-04-07 DIAGNOSIS — N1831 Chronic kidney disease, stage 3a: Secondary | ICD-10-CM | POA: Diagnosis not present

## 2021-04-07 DIAGNOSIS — E782 Mixed hyperlipidemia: Secondary | ICD-10-CM | POA: Diagnosis not present

## 2021-04-07 DIAGNOSIS — I129 Hypertensive chronic kidney disease with stage 1 through stage 4 chronic kidney disease, or unspecified chronic kidney disease: Secondary | ICD-10-CM | POA: Diagnosis not present

## 2021-04-07 DIAGNOSIS — R5383 Other fatigue: Secondary | ICD-10-CM | POA: Diagnosis not present

## 2021-04-07 NOTE — Patient Instructions (Addendum)
Medication Instructions:  ?Your physician recommends that you continue on your current medications as directed. Please refer to the Current Medication list given to you today.  ? ?If you need a refill on your cardiac medications before your next appointment, please call your pharmacy.  ? ?Lab work: ?No new labs needed ? ?Testing/Procedures: ? ?Your physician has requested that you have an echocardiogram. Echocardiography is a painless test that uses sound waves to create images of your heart. It provides your doctor with information about the size and shape of your heart and how well your heart?s chambers and valves are working. This procedure takes approximately one hour. There are no restrictions for this procedure.  ? ?Follow-Up: ?At [facility], you and your health needs are our priority.  As part of our continuing mission to provide you with exceptional heart care, we have created designated Provider Care Teams.  These Care Teams include your primary Cardiologist (physician) and Advanced Practice Providers (APPs -  Physician Assistants and Nurse Practitioners) who all work together to provide you with the care you need, when you need it. ? ?You will need a follow up appointment as needed ? ?Providers on your designated Care Team:   ?Murray Hodgkins, NP ?Christell Faith, PA-C ?Cadence Kathlen Mody, PA-C ? ?COVID-19 Vaccine Information can be found at: [email or web address] For questions related to vaccine distribution or appointments, please email [email or web address] or call [phone].  ? ?

## 2021-04-07 NOTE — Progress Notes (Signed)
Cardiology Office Note ? ?Date:  04/07/2021  ? ?ID:  Theresa Hall, DOB 1940-03-17, MRN [id number] ? ?PCP:  Steele Sizer, MD  ? ?Chief Complaint  ?Patient presents with  ? New Patient (Initial Visit)  ?  Ref by Dr. Ancil Boozer for shortness of breath. Patient c/o shortness of breath with climbing stairs or walking and does not have the energy she had 2-3 months ago. Medications reviewed by the patient verbally.   ? ? ?HPI:  ?[name] is a 81 year old woman with past medical history of ?Chronic kidney disease stage IIIa ?Essential hypertension ?Referred by Dr. Ancil Boozer for shortness of breath ? ?On discussions today, she reports having increased SOB, fatigue with house work ?No regular exercise program ?Active at baseline, lost her husband 13 years ago ?Concerned about strong family history coronary disease ? ?She does report some mild weight gain over the past several years the review of numbers shows no significant change in weight ? ?She is non-smoker, no diabetes, takes cholesterol medication ? ?Lab work reviewed ?A1C 5.2 ?Total chol 182, (AVG 150 to 180) ?Non smoker ? ?BP typically well controlled 120/60 ? ?CT abdomen pelvis:  ?minimal aorta atherosclerosis, no iliac athero ?Images pulled up and reviewed on today's visit ? ?Family hx ?Multiple family members with CAD ? ?EKG personally reviewed by myself on todays visit ?Normal sinus rhythm rate 65 bpm no significant ST-T wave changes ? ?PMH:   has a past medical history of Allergic rhinitis, cause unspecified, Allergy, Cancer ([place]) (10/2011), Cancer ([place]) (2006), Chronic kidney disease, stage I, Chronic kidney disease, stage III (moderate) ([place]), Diverticulitis of colon (without mention of hemorrhage)(562.11), GERD (gastroesophageal reflux disease), Glaucoma, Hypertension, Migraine without aura, without mention of intractable migraine without mention of status migrainosus, Osteoarthritis, Osteopenia, Other abnormal glucose, Other and unspecified  hyperlipidemia, PONV (postoperative nausea and vomiting), Postablative ovarian failure, Pure hypercholesterolemia, Unspecified glaucoma(365.9), Unspecified hypothyroidism, and Vertigo. ? ?PSH:    ?Past Surgical History:  ?Procedure Laterality Date  ? APPENDECTOMY    ? BASAL CELL CARCINOMA EXCISION    ? BASAL CELL CARCINOMA EXCISION  2014  ? nasal  ? CATARACT EXTRACTION W/PHACO Left 07/03/2019  ? Procedure: CATARACT EXTRACTION PHACO AND INTRAOCULAR LENS PLACEMENT (I[facility]) LEFT KAHOOK DUAL BLADE GONIOTOMY 9.84 01:14.5 13.2%;  Surgeon: Leandrew Koyanagi, MD;  Location: [place];  Service: Ophthalmology;  Laterality: Left;  ? CATARACT EXTRACTION W/PHACO Right 08/07/2019  ? Procedure: CATARACT EXTRACTION PHACO AND INTRAOCULAR LENS PLACEMENT ([place]) RIGHT KAHOOK DUAL BLADE GONIOTOMY;  Surgeon: Leandrew Koyanagi, MD;  Location: [place];  Service: Ophthalmology;  Laterality: Right;  6.46 ?0:59.4 ?10.9%  ? CHOLECYSTECTOMY    ? COLONOSCOPY    ? ORIF PATELLA Right 08/15/2015  ? Procedure: OPEN REDUCTION INTERNAL (ORIF) FIXATION PATELLA  and application wound vac;  Surgeon: Corky Mull, MD;  Location: [facility];  Service: Orthopedics;  Laterality: Right;  ? TONSILLECTOMY    ? VAGINAL HYSTERECTOMY    ? ? ?Current Outpatient Medications  ?Medication Sig Dispense Refill  ? alendronate (FOSAMAX) 35 MG tablet TAKE 2 TABLETS BY MOUTH EVERY 7 (SEVEN) DAYS. TAKE WITH A FULL GLASS OF WATER ON AN EMPTY STOMACH. 12 tablet 3  ? azelastine (ASTELIN) 0.1 % nasal spray PLACE 2 SPRAYS INTO BOTH NOSTRILS 2 (TWO) TIMES DAILY. USE IN EACH NOSTRIL AS DIRECTED 30 mL 4  ? cholecalciferol (VITAMIN D) 1000 units tablet Take 1 capsule by mouth daily.    ? famotidine (PEPCID) 40 MG tablet TAKE 1 TABLET BY  MOUTH EVERY DAY 90 tablet 1  ? Fish Oil-Cholecalciferol (FISH OIL + D3) 1200-1000 MG-UNIT CAPS Take 1 capsule by mouth daily.    ? fluticasone (FLONASE) 50 MCG/ACT nasal spray SPRAY 1 SPRAY INTO EACH NOSTRIL DAILY 48 mL 0  ?  irbesartan (AVAPRO) 75 MG tablet Take 1 tablet (75 mg total) by mouth daily. 90 tablet 1  ? Multiple Vitamin (MULTIVITAMIN) tablet Take 1 tablet by mouth daily.    ? pravastatin (PRAVACHOL) 80 MG tablet TAKE 1 TABLET BY MOUTH EVERY DAY 90 tablet 3  ? SYNTHROID 75 MCG tablet TAKE 1 TABLET (75 MCG TOTAL) BY MOUTH DAILY BEFORE BREAKFAST. ONE DAILY AND SKIP SUNDAYS (Patient taking differently: Take 75 mcg by mouth daily before breakfast.) 30 tablet 0  ? timolol (TIMOPTIC) 0.5 % ophthalmic solution Administer 1 drop into both eyes 1 (one) time each day in the morning    ? vitamin C (ASCORBIC ACID) 500 MG tablet Take 500 mg by mouth daily.    ? ?No current facility-administered medications for this visit.  ? ? ? ?Allergies:   Codeine and Oxycodone  ? ?Social History:  The patient  reports that she has never smoked. She has never used smokeless tobacco. She reports that she does not drink alcohol and does not use drugs.  ? ?Family History:   family history includes Diabetes in her brother and sister; Heart attack in her brother, brother, and father; Heart disease in her sister; Kidney disease in her sister.  ? ? ?Review of Systems: ?Review of Systems  ?Constitutional: Negative.   ?HENT: Negative.    ?Respiratory: Negative.    ?Cardiovascular: Negative.   ?Gastrointestinal: Negative.   ?Musculoskeletal: Negative.   ?Neurological: Negative.   ?Psychiatric/Behavioral: Negative.    ?All other systems reviewed and are negative. ? ? ?PHYSICAL EXAM: ?VS:  BP (!) 146/78 (BP Location: Right Arm, Patient Position: Sitting, Cuff Size: Normal)   Pulse 65   Ht '5\' 4"'$  (1.626 m)   Wt 157 lb 8 oz (71.4 kg)   LMP 01/11/1987 (Approximate)   SpO2 99%   BMI 27.03 kg/m?  , BMI Body mass index is 27.03 kg/m?. ?GEN: Well nourished, well developed, in no acute distress ?HEENT: normal ?Neck: no JVD, carotid bruits, or masses ?Cardiac: RRR; no murmurs, rubs, or gallops,no edema  ?Respiratory:  clear to auscultation bilaterally, normal work of  breathing ?GI: soft, nontender, nondistended, + BS ?MS: no deformity or atrophy ?Skin: warm and dry, no rash ?Neuro:  Strength and sensation are intact ?Psych: euthymic mood, full affect ? ?Recent Labs: ?02/25/2021: ALT 23; BUN 21; Creat 1.12; Hemoglobin 13.3; Platelets 219; Potassium 4.9; Sodium 142; TSH 9.47  ? ? ?Lipid Panel ?Lab Results  ?Component Value Date  ? CHOL 182 02/25/2021  ? HDL 61 02/25/2021  ? [place] 93 02/25/2021  ? TRIG 187 (H) 02/25/2021  ? ?  ? ?Wt Readings from Last 3 Encounters:  ?04/07/21 157 lb 8 oz (71.4 kg)  ?02/25/21 156 lb (70.8 kg)  ?11/04/20 153 lb 9.6 oz (69.7 kg)  ?  ? ? ? ?ASSESSMENT AND PLAN: ? ?Problem List Items Addressed This Visit   ? ?  ? Cardiology Problems  ? HLD (hyperlipidemia)  ? Relevant Orders  ? EKG 12-Lead  ?  ? Other  ? Benign hypertensive kidney disease with chronic kidney disease - Primary  ? Relevant Orders  ? EKG 12-Lead  ? Chronic kidney disease (CKD), stage III (moderate) (HCC)  ? Relevant Orders  ? EKG  12-Lead  ? ?Other Visit Diagnoses   ? ? SOB (shortness of breath)      ? Relevant Orders  ? ECHOCARDIOGRAM COMPLETE  ? Other fatigue      ? Relevant Orders  ? ECHOCARDIOGRAM COMPLETE  ? ?  ? ?Shortness of breath/fatigue ?CT scan no significant aortic atherosclerosis/PAD ?Normal blood pressure,  ?low risk for underlying cardiac disease given no significant diabetes, cholesterol well controlled, non-smoker ?Denies anginal symptoms ?-Echocardiogram ordered for baseline ?Given minimal calcification seen on CT scan, no further ischemic work-up ordered ?Recommend she proceed with echo, if normal no further work-up.  For any worsening symptoms additional studies could be ordered such as cardiac CTA ?-Last on modification recommended, weight loss, walking program ? ?Hyperlipidemia ?Relatively well treated on statin ? ?Chronic kidney disease ?Risk factors well controlled including blood pressure, cholesterol, diabetes ? ? ? Total encounter time more than 60 minutes ?  Greater than 50% was spent in counseling and coordination of care with the patient ? ? ? ?Signed, ?Esmond Plants, M.D., Ph.D. ?[facility] [place], [place] ?[phone] ?

## 2021-04-21 DIAGNOSIS — H409 Unspecified glaucoma: Secondary | ICD-10-CM | POA: Diagnosis not present

## 2021-04-21 DIAGNOSIS — Z79899 Other long term (current) drug therapy: Secondary | ICD-10-CM | POA: Diagnosis not present

## 2021-04-21 DIAGNOSIS — Z008 Encounter for other general examination: Secondary | ICD-10-CM | POA: Diagnosis not present

## 2021-04-21 DIAGNOSIS — E039 Hypothyroidism, unspecified: Secondary | ICD-10-CM | POA: Diagnosis not present

## 2021-04-21 DIAGNOSIS — R32 Unspecified urinary incontinence: Secondary | ICD-10-CM | POA: Diagnosis not present

## 2021-04-21 DIAGNOSIS — E785 Hyperlipidemia, unspecified: Secondary | ICD-10-CM | POA: Diagnosis not present

## 2021-04-21 DIAGNOSIS — M1990 Unspecified osteoarthritis, unspecified site: Secondary | ICD-10-CM | POA: Diagnosis not present

## 2021-04-21 DIAGNOSIS — I10 Essential (primary) hypertension: Secondary | ICD-10-CM | POA: Diagnosis not present

## 2021-04-21 DIAGNOSIS — K219 Gastro-esophageal reflux disease without esophagitis: Secondary | ICD-10-CM | POA: Diagnosis not present

## 2021-04-21 DIAGNOSIS — M810 Age-related osteoporosis without current pathological fracture: Secondary | ICD-10-CM | POA: Diagnosis not present

## 2021-04-21 DIAGNOSIS — J302 Other seasonal allergic rhinitis: Secondary | ICD-10-CM | POA: Diagnosis not present

## 2021-04-21 DIAGNOSIS — Z7982 Long term (current) use of aspirin: Secondary | ICD-10-CM | POA: Diagnosis not present

## 2021-04-21 DIAGNOSIS — Z7983 Long term (current) use of bisphosphonates: Secondary | ICD-10-CM | POA: Diagnosis not present

## 2021-04-23 ENCOUNTER — Ambulatory Visit (INDEPENDENT_AMBULATORY_CARE_PROVIDER_SITE_OTHER): Payer: Medicare HMO

## 2021-04-23 DIAGNOSIS — R5383 Other fatigue: Secondary | ICD-10-CM

## 2021-04-23 DIAGNOSIS — R0602 Shortness of breath: Secondary | ICD-10-CM | POA: Diagnosis not present

## 2021-04-23 LAB — ECHOCARDIOGRAM COMPLETE
AR max vel: 2.71 cm2
AV Area VTI: 2.93 cm2
AV Area mean vel: 2.83 cm2
AV Mean grad: 3 mmHg
AV Peak grad: 7 mmHg
Ao pk vel: 1.32 m/s
Area-P 1/2: 2.16 cm2
Calc EF: 56.8 %
S' Lateral: 2.6 cm
Single Plane A2C EF: 59.9 %
Single Plane A4C EF: 53.8 %

## 2021-04-30 ENCOUNTER — Other Ambulatory Visit: Payer: Self-pay

## 2021-04-30 DIAGNOSIS — E039 Hypothyroidism, unspecified: Secondary | ICD-10-CM

## 2021-05-01 LAB — TSH: TSH: 1.98 m[IU]/L (ref 0.40–4.50)

## 2021-05-02 ENCOUNTER — Other Ambulatory Visit: Payer: Self-pay | Admitting: Family Medicine

## 2021-05-02 MED ORDER — SYNTHROID 75 MCG PO TABS: 75.0000 ug | ORAL_TABLET | Freq: Every day | ORAL | 0 refills | Status: DC

## 2021-05-03 DIAGNOSIS — Z08 Encounter for follow-up examination after completed treatment for malignant neoplasm: Secondary | ICD-10-CM | POA: Diagnosis not present

## 2021-05-03 DIAGNOSIS — Z85828 Personal history of other malignant neoplasm of skin: Secondary | ICD-10-CM | POA: Diagnosis not present

## 2021-05-03 DIAGNOSIS — Z85820 Personal history of malignant melanoma of skin: Secondary | ICD-10-CM | POA: Diagnosis not present

## 2021-05-03 DIAGNOSIS — D225 Melanocytic nevi of trunk: Secondary | ICD-10-CM | POA: Diagnosis not present

## 2021-05-03 DIAGNOSIS — L821 Other seborrheic keratosis: Secondary | ICD-10-CM | POA: Diagnosis not present

## 2021-06-01 ENCOUNTER — Other Ambulatory Visit: Payer: Self-pay | Admitting: Family Medicine

## 2021-06-13 ENCOUNTER — Other Ambulatory Visit: Payer: Self-pay | Admitting: Family Medicine

## 2021-07-01 ENCOUNTER — Other Ambulatory Visit: Payer: Self-pay | Admitting: Family Medicine

## 2021-07-10 ENCOUNTER — Other Ambulatory Visit: Payer: Self-pay | Admitting: Family Medicine

## 2021-07-10 DIAGNOSIS — E785 Hyperlipidemia, unspecified: Secondary | ICD-10-CM

## 2021-07-15 ENCOUNTER — Other Ambulatory Visit: Payer: Self-pay | Admitting: Family Medicine

## 2021-07-15 DIAGNOSIS — K219 Gastro-esophageal reflux disease without esophagitis: Secondary | ICD-10-CM

## 2021-08-12 ENCOUNTER — Ambulatory Visit (INDEPENDENT_AMBULATORY_CARE_PROVIDER_SITE_OTHER): Payer: Medicare HMO

## 2021-08-12 DIAGNOSIS — Z0000 Encounter for general adult medical examination without abnormal findings: Secondary | ICD-10-CM | POA: Diagnosis not present

## 2021-08-12 NOTE — Progress Notes (Signed)
Subjective:  I connected with  Theresa Hall on 08/12/21 by a audio enabled telemedicine application and verified that I am speaking with the correct person using two identifiers.  Patient Location: Home  Provider Location: Office/Clinic  I discussed the limitations of evaluation and management by telemedicine. The patient expressed understanding and agreed to proceed.  Theresa Hall is a 81 y.o. female who presents for Medicare Annual (Subsequent) preventive examination.  Review of Systems    Defer to PCP       Objective:    There were no vitals filed for this visit. There is no height or weight on file to calculate BMI.     08/11/2020    4:20 PM 08/07/2019   10:43 AM 07/23/2019    2:37 PM 07/03/2019    8:48 AM 07/20/2018    1:35 PM 07/18/2017    2:46 PM 06/20/2016    2:13 PM  Advanced Directives  Does Patient Have a Medical Advance Directive? Yes Yes Yes Yes Yes Yes Yes  Type of Paramedic of [place];Living will [place];Living will [place];Living will [place];Living will [place];Living will [place];Living will [place];Living will  Does patient want to make changes to medical advance directive?  No - Patient declined  No - Patient declined     Copy of [place] in Chart? No - copy requested No - copy requested No - copy requested No - copy requested No - copy requested No - copy requested No - copy requested    Current Medications (verified) Outpatient Encounter Medications as of 08/12/2021  Medication Sig   alendronate (FOSAMAX) 35 MG tablet TAKE 2 TABLETS BY MOUTH EVERY 7 (SEVEN) DAYS. TAKE WITH A FULL GLASS OF WATER ON AN EMPTY STOMACH.   azelastine (ASTELIN) 0.1 % nasal spray PLACE 2 SPRAYS INTO BOTH NOSTRILS 2 (TWO) TIMES DAILY. USE IN EACH NOSTRIL AS DIRECTED   cholecalciferol (VITAMIN D) 1000 units tablet  Take 1 capsule by mouth daily.   famotidine (PEPCID) 40 MG tablet TAKE 1 TABLET BY MOUTH EVERY DAY   Fish Oil-Cholecalciferol (FISH OIL + D3) 1200-1000 MG-UNIT CAPS Take 1 capsule by mouth daily.   fluticasone (FLONASE) 50 MCG/ACT nasal spray SPRAY 1 SPRAY INTO EACH NOSTRIL EVERY DAY   irbesartan (AVAPRO) 75 MG tablet Take 1 tablet (75 mg total) by mouth daily.   Multiple Vitamin (MULTIVITAMIN) tablet Take 1 tablet by mouth daily.   pravastatin (PRAVACHOL) 80 MG tablet TAKE 1 TABLET BY MOUTH EVERY DAY   SYNTHROID 75 MCG tablet TAKE 1 TABLET (75 MCG TOTAL) BY MOUTH DAILY BEFORE BREAKFAST. ONE DAILY AND SKIP SUNDAYS   timolol (TIMOPTIC) 0.5 % ophthalmic solution Administer 1 drop into both eyes 1 (one) time each day in the morning   vitamin C (ASCORBIC ACID) 500 MG tablet Take 500 mg by mouth daily.   No facility-administered encounter medications on file as of 08/12/2021.    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   Cancer ([place]) 2006   melanoma on shoulder    Chronic kidney disease, stage I    Chronic kidney disease, stage III (moderate) (HCC)    Diverticulitis of colon (without mention of hemorrhage)(562.11)    GERD (  gastroesophageal reflux disease)    Glaucoma    Hypertension    Migraine without aura, without mention of intractable migraine without mention of status migrainosus    Osteoarthritis    Osteopenia    Other abnormal glucose    Other and unspecified hyperlipidemia    PONV (postoperative nausea and vomiting)    Postablative ovarian failure    Pure hypercholesterolemia    Unspecified glaucoma(365.9)    Unspecified hypothyroidism    Vertigo    none for over 2 yrs   Past Surgical History:  Procedure Laterality Date   APPENDECTOMY     BASAL CELL CARCINOMA EXCISION     BASAL CELL CARCINOMA EXCISION  2014    nasal   CATARACT EXTRACTION W/PHACO Left 07/03/2019   Procedure: CATARACT EXTRACTION PHACO AND INTRAOCULAR LENS PLACEMENT (IOC) LEFT KAHOOK DUAL BLADE GONIOTOMY 9.84 01:14.5 13.2%;  Surgeon: Leandrew Koyanagi, MD;  Location: [facility];  Service: Ophthalmology;  Laterality: Left;   CATARACT EXTRACTION W/PHACO Right 08/07/2019   Procedure: CATARACT EXTRACTION PHACO AND INTRAOCULAR LENS PLACEMENT ([place]) RIGHT KAHOOK DUAL BLADE GONIOTOMY;  Surgeon: Leandrew Koyanagi, MD;  Location: [facility];  Service: Ophthalmology;  Laterality: Right;  6.46 0:59.4 10.9%   CHOLECYSTECTOMY     COLONOSCOPY     ORIF PATELLA Right 08/15/2015   Procedure: OPEN REDUCTION INTERNAL (ORIF) FIXATION PATELLA  and application wound vac;  Surgeon: Corky Mull, MD;  Location: [facility];  Service: Orthopedics;  Laterality: Right;   TONSILLECTOMY     VAGINAL HYSTERECTOMY     Family History  Problem Relation Age of Onset   Heart attack Father        x2   Heart disease Sister    Kidney disease Sister    Diabetes Sister    Heart attack Brother    Diabetes Brother    Heart attack Brother    Breast cancer Neg Hx    Social History   Socioeconomic History   Marital status: Widowed    Spouse name: Gershon Mussel   Number of children: 2   Years of education: some college   Highest education level: 12th grade  Occupational History   Occupation: Retired  Tobacco Use   Smoking status: Never   Smokeless tobacco: Never   Tobacco comments:    smoking cessation materials not required  Vaping Use   Vaping Use: Never used  Substance and Sexual Activity   Alcohol use: No   Drug use: No   Sexual activity: Not Currently    Birth control/protection: Surgical    Comment: TAH-1989  Other Topics Concern   Not on file  Social History Narrative   She has been a widow since 2009. Pt lives alone.    Social Determinants of Health   Financial Resource Strain: Low Risk  (08/11/2020)   Overall Financial Resource Strain  (CARDIA)    Difficulty of Paying Living Expenses: Not hard at all  Food Insecurity: No Food Insecurity (08/11/2020)   Hunger Vital Sign    Worried About Running Out of Food in the Last Year: Never true    Ran Out of Food in the Last Year: Never true  Transportation Needs: No Transportation Needs (08/11/2020)   PRAPARE - Hydrologist (Medical): No    Lack of Transportation (Non-Medical): No  Physical Activity: Insufficiently Active (08/11/2020)   Exercise Vital Sign    Days of Exercise per Week: 7 days    Minutes of Exercise per Session: 10  min  Stress: No Stress Concern Present (08/11/2020)   [place]    Feeling of Stress : Not at all  Social Connections: Moderately Integrated (08/11/2020)   Social Connection and Isolation Panel [NHANES]    Frequency of Communication with Friends and Family: More than three times a week    Frequency of Social Gatherings with Friends and Family: Three times a week    Attends Religious Services: More than 4 times per year    Active Member of Clubs or Organizations: Yes    Attends Archivist Meetings: More than 4 times per year    Marital Status: Widowed    Tobacco Counseling Counseling given: Not Answered Tobacco comments: smoking cessation materials not required   Clinical Intake:                 Diabetic?N/A         Activities of Daily Living    02/25/2021   10:00 AM 11/04/2020    1:14 PM  In your present state of health, do you have any difficulty performing the following activities:  Hearing? 0 0  Vision? 0 0  Difficulty concentrating or making decisions? 0 0  Walking or climbing stairs? 0 0  Dressing or bathing? 0 0  Doing errands, shopping? 0 0    Patient Care Team: Steele Sizer, MD as PCP - General (Family Medicine) Lavonia Dana, MD as Consulting Physician (Nephrology) Dasher, Rayvon Char, MD as Consulting Physician  (Dermatology) Kem Boroughs, [place] as Nurse Practitioner (Gynecology)  Indicate any recent Medical Services you may have received from other than [facility] providers in the past year (date may be approximate).     Assessment:   This is a routine wellness examination for [place].  Hearing/Vision screen No results found.  Dietary issues and exercise activities discussed:     Goals Addressed   None   Depression Screen    02/25/2021   10:00 AM 11/04/2020    1:15 PM 08/25/2020   11:04 AM 08/11/2020    4:19 PM 02/19/2020    1:35 PM 08/19/2019   11:10 AM 07/23/2019    2:34 PM  PHQ 2/9 Scores  PHQ - 2 Score 0 0 0 0 0 0 0  PHQ- 9 Score 0    0 0     Fall Risk    02/25/2021   10:00 AM 11/04/2020    1:14 PM 08/25/2020   11:03 AM 08/11/2020    4:23 PM 02/19/2020    1:35 PM  [place] in the past year? 0 0 0 0 0  Number falls in past yr: 0 0 0 0 0  Injury with Fall? 0 0 0 0 0  Risk for fall due to : No Fall Risks  No Fall Risks No Fall Risks   Follow up Falls prevention discussed Falls evaluation completed Falls prevention discussed Falls prevention discussed     FALL RISK PREVENTION PERTAINING TO THE HOME:  Any stairs in or around the home? Yes  If so, are there any without handrails? Yes  Home free of loose throw rugs in walkways, pet beds, electrical cords, etc? Yes  Adequate lighting in your home to reduce risk of falls? Yes   ASSISTIVE DEVICES UTILIZED TO PREVENT FALLS:  Life alert? No  Use of a cane, walker or w/c? No  Grab bars in the bathroom? Yes  Shower chair or bench in shower? Yes  Elevated toilet  seat or a handicapped toilet? Yes   TIMED UP AND GO:  Was the test performed? No .  Length of time to ambulate 10 feet: N/A sec.     Cognitive Function:        07/23/2019    2:44 PM 07/20/2018    1:47 PM 07/18/2017    2:29 PM  6CIT Screen  What Year? 0 points 0 points 0 points  What month? 0 points 0 points 0 points  What time? 0 points 0 points 0 points   Count back from 20 0 points 0 points 0 points  Months in reverse 0 points 0 points 0 points  Repeat phrase 0 points 0 points 0 points  Total Score 0 points 0 points 0 points    Immunizations Immunization History  Administered Date(s) Administered   Fluad Quad(high Dose 65+) 10/16/2018, 11/04/2020   Influenza Split 11/01/2006, 10/16/2008   Influenza, High Dose Seasonal PF 12/12/2014, 09/28/2015, 10/18/2016, 10/13/2017   Influenza, Seasonal, Injecte, Preservative Fre 09/07/2010, 01/16/2012   Influenza,inj,Quad PF,6+ Mos 12/10/2012, 10/14/2013   Influenza-Unspecified 09/12/2019   PFIZER Comirnaty(Gray Top)Covid-19 Tri-Sucrose Vaccine 05/09/2020   PFIZER(Purple Top)SARS-COV-2 Vaccination 01/29/2019, 02/21/2019, 10/29/2019, 05/09/2020   Pneumococcal Conjugate-13 12/17/2013   Pneumococcal Polysaccharide-23 09/28/2015   Tdap 03/03/2010   Zoster, Live 11/28/2007    TDAP status: Up to date  Flu Vaccine status: Up to date  Pneumococcal vaccine status: Up to date  Covid-19 vaccine status: Completed vaccines  Qualifies for Shingles Vaccine? Yes   Zostavax completed No   Shingrix Completed?: No.    Education has been provided regarding the importance of this vaccine. Patient has been advised to call insurance company to determine out of pocket expense if they have not yet received this vaccine. Advised may also receive vaccine at local pharmacy or Health Dept. Verbalized acceptance and understanding.  Screening Tests Health Maintenance  Topic Date Due   Zoster Vaccines- Shingrix (1 of 2) Never done   COVID-19 Vaccine (6 - Booster for Pfizer series) 07/04/2020   INFLUENZA VACCINE  08/10/2021   TETANUS/TDAP  11/04/2021 (Originally 03/03/2020)   MAMMOGRAM  02/16/2022   Pneumonia Vaccine 75+ Years old  Completed   DEXA SCAN  Completed   HPV VACCINES  Aged Out    Health Maintenance  Health Maintenance Due  Topic Date Due   Zoster Vaccines- Shingrix (1 of 2) Never done   COVID-19  Vaccine (6 - Booster for Pfizer series) 07/04/2020   INFLUENZA VACCINE  08/10/2021    Colorectal cancer screening: Type of screening: Cologuard. Completed 2022. Repeat every 5 years  Mammogram status: Completed 02/16/2021. Repeat every year  Bone Density status: Completed 02/16/2021. Results reflect: Bone density results: OSTEOPENIA. Repeat every 3 years.  Lung Cancer Screening: (Low Dose CT Chest recommended if Age 65-80 years, 30 pack-year currently smoking OR have quit w/in 15years.) does not qualify.   Lung Cancer Screening Referral: N/A  Additional Screening:  Hepatitis C Screening: does not qualify; Completed N/A  Vision Screening: Recommended annual ophthalmology exams for early detection of glaucoma and other disorders of the eye. Is the patient up to date with their annual eye exam?  Yes  Who is the provider or what is the name of the office in which the patient attends annual eye exams? Dr Odelia Gage If pt is not established with a provider, would they like to be referred to a provider to establish care?  N/A .   Dental Screening: Recommended annual dental exams for proper oral hygiene  Community Resource Referral / Chronic Care Management: CRR required this visit?  No   CCM required this visit?  No      Plan:     I have personally reviewed and noted the following in the patient's chart:   Medical and social history Use of alcohol, tobacco or illicit drugs  Current medications and supplements including opioid prescriptions.  Functional ability and status Nutritional status Physical activity Advanced directives List of other physicians Hospitalizations, surgeries, and ER visits in previous 12 months Vitals Screenings to include cognitive, depression, and falls Referrals and appointments  In addition, I have reviewed and discussed with patient certain preventive protocols, quality metrics, and best practice recommendations. A written personalized care plan  for preventive services as well as general preventive health recommendations were provided to patient.     Royal Hawthorn, [place]   08/12/2021   Nurse Notes: Non-Face to Face 28 minutes spent   Ms. Baldonado , Thank you for taking time to come for your Medicare Wellness Visit. I appreciate your ongoing commitment to your health goals. Please review the following plan we discussed and let me know if I can assist you in the future.   These are the goals we discussed:  Goals      DIET - INCREASE WATER INTAKE     Recommend to drink at least 6-8 8oz glasses of water per day.        This is a list of the screening recommended for you and due dates:  Health Maintenance  Topic Date Due   Zoster (Shingles) Vaccine (1 of 2) Never done   COVID-19 Vaccine (6 - Booster for Pfizer series) 07/04/2020   Flu Shot  08/10/2021   Tetanus Vaccine  11/04/2021*   Mammogram  02/16/2022   Pneumonia Vaccine  Completed   DEXA scan (bone density measurement)  Completed   HPV Vaccine  Aged Out  *Topic was postponed. The date shown is not the original due date.

## 2021-08-25 ENCOUNTER — Ambulatory Visit: Payer: Medicare HMO | Admitting: Family Medicine

## 2021-09-03 DIAGNOSIS — H401131 Primary open-angle glaucoma, bilateral, mild stage: Secondary | ICD-10-CM | POA: Diagnosis not present

## 2021-09-09 NOTE — Progress Notes (Deleted)
Name: Theresa Hall   MRN: [id number]    DOB: 08-12-1940   Date:09/09/2021       Progress Note  Subjective  Chief Complaint  Follow Up  HPI  HTN and she has a history of CKI stage III: reviewed last labs and GFR back in August was slightly lower at 85  she has a follow up with Dr.Kolluru in the next couple of weeks.  She is on low dose of Avapro no chest pain palpitation or dizziness, today her bp is towards low end of normal and she has been feeling tired lately, advised to come in for bp check with CMA - we may need to decrease dose of bp medication .  Denies pruritus or change in urinary frequency .She does not take NSAID's    Hypothyroidism: last TSH was at goal, continue current dose of 75 daily and skip Sunday's. She denies dry skin, hair loss, change in bowel movement or dysphagia. She has noticed some fatigue lately   Fatigue: states not as much energy lately, gradual over the past year she states unable to vacuum the entire house, she needs to take a break, she also needs to take a break when mopping the kitchen.It is not associated with diaphoresis, or nausea but feels SOB and resolves with rest.   We will check labs but also discussed referral to cardiologist    Migraine headaches: she had it for many years, she finally came off Topamax a few months ago and no episodes since.   Hyperlipidemia: taking medication and denies side effects, reviewed labs done LDL was 88 , we will recheck labs today    Osteopenia: discussed results, discussed high calcium and vitamin D diet and she has been taking low dose fosamax without side effects. Reviewed bone density done 02/2021 and showed mild improvement on femur, and slightly worse on wrist from -2.3 to -2.4    GERD/Hiatal hernia: she states symptoms are intermittent, she eats dinner around 7 pm, smaller portions , she states rarely has symptoms now, we advised pepcid every other day and she states some weeks she takes it daily but symptoms  have been controlled   Fatty liver: found on CT abdomen and pelvis..No longer having constipation or abdominal cramping since eating more fiber. 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/22/2019   BCC (basal cell carcinoma of skin) 10/16/2017   Secondary hyperparathyroidism of renal origin ([place]) 07/18/2017   Osteopenia 08/15/2016   Closed displaced comminuted fracture of right patella 08/20/2015   Reflux esophagitis 06/23/2014   HLD (hyperlipidemia) 06/23/2014   Palpitations 06/23/2014   Chronic kidney disease (CKD), stage III (moderate) ([place]) 08/24/2009   History of diverticulitis 05/14/2008   Adult hypothyroidism 12/04/2006   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    Past Surgical History:  Procedure Laterality Date   APPENDECTOMY     BASAL CELL CARCINOMA EXCISION     BASAL CELL CARCINOMA EXCISION  2014   nasal   CATARACT EXTRACTION W/PHACO Left 07/03/2019   Procedure: CATARACT EXTRACTION PHACO AND INTRAOCULAR LENS PLACEMENT (IOC) LEFT KAHOOK DUAL BLADE GONIOTOMY 9.84 01:14.5 13.2%;  Surgeon: Leandrew Koyanagi, MD;  Location: [facility];  Service: Ophthalmology;  Laterality: Left;   CATARACT EXTRACTION W/PHACO Right 08/07/2019   Procedure: CATARACT EXTRACTION PHACO AND INTRAOCULAR LENS PLACEMENT ([place]) RIGHT KAHOOK DUAL BLADE GONIOTOMY;  Surgeon: Leandrew Koyanagi, MD;  Location: [facility];  Service: Ophthalmology;  Laterality: Right;  6.46 0:59.4 10.9%   CHOLECYSTECTOMY     COLONOSCOPY     ORIF PATELLA Right 08/15/2015   Procedure: OPEN REDUCTION INTERNAL (ORIF) FIXATION PATELLA  and application wound vac;  Surgeon: Corky Mull, MD;  Location: [facility];  Service: Orthopedics;  Laterality: Right;   TONSILLECTOMY     VAGINAL HYSTERECTOMY      Family History  Problem Relation Age of Onset   Heart attack Father        x2   Heart disease Sister     Kidney disease Sister    Diabetes Sister    Heart attack Brother    Diabetes Brother    Heart attack Brother    Breast cancer Neg Hx     Social History   Tobacco Use   Smoking status: Never   Smokeless tobacco: Never   Tobacco comments:    smoking cessation materials not required  Substance Use Topics   Alcohol use: No     Current Outpatient Medications:    alendronate (FOSAMAX) 35 MG tablet, TAKE 2 TABLETS BY MOUTH EVERY 7 (SEVEN) DAYS. TAKE WITH A FULL GLASS OF WATER ON AN EMPTY STOMACH., Disp: 12 tablet, Rfl: 3   alendronate (FOSAMAX) 70 MG tablet, Take 70 mg by mouth once a week., Disp: , Rfl:    azelastine (ASTELIN) 0.1 % nasal spray, PLACE 2 SPRAYS INTO BOTH NOSTRILS 2 (TWO) TIMES DAILY. USE IN EACH NOSTRIL AS DIRECTED, Disp: 30 mL, Rfl: 4   cholecalciferol (VITAMIN D) 1000 units tablet, Take 1 capsule by mouth daily., Disp: , Rfl:    famotidine (PEPCID) 40 MG tablet, TAKE 1 TABLET BY MOUTH EVERY DAY, Disp: 90 tablet, Rfl: 1   Fish Oil-Cholecalciferol (FISH OIL + D3) 1200-1000 MG-UNIT CAPS, Take 1 capsule by mouth daily., Disp: , Rfl:    fluticasone (FLONASE) 50 MCG/ACT nasal spray, SPRAY 1 SPRAY INTO EACH NOSTRIL EVERY DAY, Disp: 48 mL, Rfl: 0   irbesartan (AVAPRO) 75 MG tablet, Take 1 tablet (75 mg total) by mouth daily., Disp: 90 tablet, Rfl: 1   Multiple Vitamin (MULTIVITAMIN) tablet, Take 1 tablet by mouth daily., Disp: , Rfl:    pravastatin (PRAVACHOL) 80 MG tablet, TAKE 1 TABLET BY MOUTH EVERY DAY, Disp: 90 tablet, Rfl: 0   SYNTHROID 75 MCG tablet, TAKE 1 TABLET (75 MCG TOTAL) BY MOUTH DAILY BEFORE BREAKFAST. ONE DAILY AND SKIP SUNDAYS, Disp: 78 tablet, Rfl: 0   timolol (TIMOPTIC) 0.5 % ophthalmic solution, Administer 1 drop into both eyes 1 (one) time each day in the morning, Disp: , Rfl:    vitamin C (ASCORBIC ACID) 500 MG tablet, Take 500 mg by mouth daily., Disp: , Rfl:   Allergies  Allergen Reactions   Codeine Other (See Comments)    Patient had insomnia and  nausea   Oxycodone Other (See Comments)    Made her "Loopy" after surgery     I personally reviewed active problem list, medication list, allergies, family history, social history, health maintenance with the patient/caregiver today.   ROS  ***  Objective  There were no vitals filed for this visit.  There is no height or weight on file to calculate BMI.  Physical Exam ***  No results found for this or any previous visit (from the past 2160 hour(s)).   PHQ2/9:    02/25/2021   10:00 AM 11/04/2020    1:15 PM 08/25/2020   11:04 AM 08/11/2020    4:19  PM 02/19/2020    1:35 PM  Depression screen PHQ 2/9  Decreased Interest 0 0 0 0 0  Down, Depressed, Hopeless 0 0 0 0 0  PHQ - 2 Score 0 0 0 0 0  Altered sleeping 0    0  Tired, decreased energy 0    0  Change in appetite 0    0  Feeling bad or failure about yourself  0    0  Trouble concentrating 0    0  Moving slowly or fidgety/restless 0    0  Suicidal thoughts 0    0  PHQ-9 Score 0    0  Difficult doing work/chores     Not difficult at all    phq 9 is {gen pos [id number]}   Fall Risk:    02/25/2021   10:00 AM 11/04/2020    1:14 PM 08/25/2020   11:03 AM 08/11/2020    4:23 PM 02/19/2020    1:35 PM  Fall Risk   Falls in the past year? 0 0 0 0 0  Number falls in past yr: 0 0 0 0 0  Injury with Fall? 0 0 0 0 0  Risk for fall due to : No Fall Risks  No Fall Risks No Fall Risks   Follow up Falls prevention discussed Falls evaluation completed Falls prevention discussed Falls prevention discussed       Functional Status Survey:      Assessment & Plan  *** There are no diagnoses linked to this encounter.

## 2021-09-10 ENCOUNTER — Ambulatory Visit: Payer: Medicare HMO | Admitting: Family Medicine

## 2021-09-13 ENCOUNTER — Other Ambulatory Visit: Payer: Self-pay | Admitting: Family Medicine

## 2021-09-13 DIAGNOSIS — I10 Essential (primary) hypertension: Secondary | ICD-10-CM

## 2021-09-13 DIAGNOSIS — I129 Hypertensive chronic kidney disease with stage 1 through stage 4 chronic kidney disease, or unspecified chronic kidney disease: Secondary | ICD-10-CM

## 2021-09-13 DIAGNOSIS — N1831 Chronic kidney disease, stage 3a: Secondary | ICD-10-CM

## 2021-09-14 DIAGNOSIS — I129 Hypertensive chronic kidney disease with stage 1 through stage 4 chronic kidney disease, or unspecified chronic kidney disease: Secondary | ICD-10-CM | POA: Diagnosis not present

## 2021-09-14 DIAGNOSIS — N2581 Secondary hyperparathyroidism of renal origin: Secondary | ICD-10-CM | POA: Diagnosis not present

## 2021-09-14 DIAGNOSIS — N1831 Chronic kidney disease, stage 3a: Secondary | ICD-10-CM | POA: Diagnosis not present

## 2021-09-20 DIAGNOSIS — N1831 Chronic kidney disease, stage 3a: Secondary | ICD-10-CM | POA: Diagnosis not present

## 2021-09-20 DIAGNOSIS — N2581 Secondary hyperparathyroidism of renal origin: Secondary | ICD-10-CM | POA: Diagnosis not present

## 2021-09-20 DIAGNOSIS — I129 Hypertensive chronic kidney disease with stage 1 through stage 4 chronic kidney disease, or unspecified chronic kidney disease: Secondary | ICD-10-CM | POA: Diagnosis not present

## 2021-09-23 ENCOUNTER — Encounter: Payer: Self-pay | Admitting: Internal Medicine

## 2021-09-23 ENCOUNTER — Ambulatory Visit: Payer: Self-pay

## 2021-09-23 ENCOUNTER — Ambulatory Visit (INDEPENDENT_AMBULATORY_CARE_PROVIDER_SITE_OTHER): Payer: Medicare HMO | Admitting: Internal Medicine

## 2021-09-23 VITALS — BP 116/80 | HR 92 | Temp 98.3°F | Resp 16 | Ht 64.0 in | Wt 159.3 lb

## 2021-09-23 DIAGNOSIS — S29012A Strain of muscle and tendon of back wall of thorax, initial encounter: Secondary | ICD-10-CM

## 2021-09-23 MED ORDER — METHYLPREDNISOLONE 4 MG PO TBPK: ORAL_TABLET | ORAL | 1 refills | Status: DC

## 2021-09-23 MED ORDER — TIZANIDINE HCL 4 MG PO TABS: 4.0000 mg | ORAL_TABLET | Freq: Four times a day (QID) | ORAL | 0 refills | Status: DC | PRN

## 2021-09-23 NOTE — Telephone Encounter (Signed)
     Chief Complaint: Back pain, middle of back, right side Symptoms: Above Frequency: Today Pertinent Negatives: Patient denies  Disposition: '[]'$ ED /'[]'$ Urgent Care (no appt availability in office) / '[x]'$ Appointment(In office/virtual)/ '[]'$  [facility]/ '[]'$ Home Care/ '[]'$ Refused Recommended Disposition /'[]'$  Mobile Bus/ '[]'$  Follow-up with PCP Additional Notes:   Reason for Disposition  [1] SEVERE back pain (e.g., excruciating, unable to do any normal activities) AND [2] not improved 2 hours after pain medicine  Answer Assessment - Initial Assessment Questions 1. ONSET: "When did the pain begin?"      Today 2. LOCATION: "Where does it hurt?" (upper, mid or lower back)     Right side 3. SEVERITY: "How bad is the pain?"  (e.g., Scale 1-10; mild, moderate, or severe)   - MILD (1-3): Doesn't interfere with normal activities.    - MODERATE (4-7): Interferes with normal activities or awakens from sleep.    - SEVERE (8-10): Excruciating pain, unable to do any normal activities.      Severe 4. PATTERN: "Is the pain constant?" (e.g., yes, no; constant, intermittent)      With movement 5. RADIATION: "Does the pain shoot into your legs or somewhere else?"     Yes 6. CAUSE:  "What do you think is causing the back pain?"      Unsure 7. BACK OVERUSE:  "Any recent lifting of heavy objects, strenuous work or exercise?"     No 8. MEDICINES: "What have you taken so far for the pain?" (e.g., nothing, acetaminophen, NSAIDS)      9. NEUROLOGIC SYMPTOMS: "Do you have any weakness, numbness, or problems with bowel/bladder control?"     No 10. OTHER SYMPTOMS: "Do you have any other symptoms?" (e.g., fever, abdomen pain, burning with urination, blood in urine)       No 11. PREGNANCY: "Is there any chance you are pregnant?" "When was your last menstrual period?"       No  Protocols used: Back Pain-A-AH

## 2021-09-23 NOTE — Patient Instructions (Signed)
It was great seeing you today!  Plan discussed at today's visit: -Steroid pack and muscle relaxers sent to pharmacy - take muscle relaxers at night, they may make you sleepy -Can also do topicals like Voltaren, BenGay, etc.   Follow up in: as needed  Take care and let us know if you have any questions or concerns prior to your next visit.  Dr. Rosana Berger  Thoracic Strain A thoracic strain, which is sometimes called a mid-back strain, is an injury to the muscles or tendons that attach to the upper part of your back behind your chest. This type of injury occurs when a muscle is overstretched or overloaded. Thoracic strains can range from mild to severe. Mild strains may involve stretching a muscle or tendon without tearing it. These injuries may heal in 1-2 weeks. More severe strains involve tearing of muscle fibers or tendons. These will cause more pain and may take 6-8 weeks to heal. What are the causes? This condition may be caused by: Trauma, such as a fall or a hit to the body. Twisting or overstretching the back. This may result from doing activities that require a lot of energy, such as lifting heavy objects. In some cases, the cause may not be known. What increases the risk? This injury is more common in: Athletes. People with obesity. What are the signs or symptoms? The main symptom of this condition is pain in the middle back, especially with movement. Other symptoms include: Stiffness or limited range of motion. Sudden muscle tightening (spasms). How is this diagnosed? This condition may be diagnosed based on: Your symptoms. Your medical history. A physical exam. Imaging tests, such as X-rays or an MRI. How is this treated? This condition may be treated with: Resting the injured area. Applying heat and cold to the injured area. Over-the-counter medicines for pain and inflammation, such as NSAIDs. Prescription pain medicine or muscle relaxants may be needed for a short  time. Physical therapy. This will involve doing stretching and strengthening exercises. Follow these instructions at home: Managing pain, stiffness, and swelling     If directed, put ice on the injured area. Put ice in a plastic bag. Place a towel between your skin and the bag. Leave the ice on for 20 minutes, 2-3 times a day. If directed, apply heat to the affected area as often as told by your health care provider. Use the heat source that your health care provider recommends, such as a moist heat pack or a heating pad. Place a towel between your skin and the heat source. Leave the heat on for 20-30 minutes. Remove the heat if your skin turns bright red. This is especially important if you are unable to feel pain, heat, or cold. You may have a greater risk of getting burned. Activity Rest and return to your normal activities as told by your health care provider. Ask your health care provider what activities are safe for you. Do exercises as told by your health care provider. Medicines Take over-the-counter and prescription medicines only as told by your health care provider. Ask your health care provider if the medicine prescribed to you: Requires you to avoid driving or using heavy machinery. Can cause constipation. You may need to take these actions to prevent or treat constipation: Drink enough fluid to keep your urine pale yellow. Take over-the-counter or prescription medicines. Eat foods that are high in fiber, such as beans, whole grains, and fresh fruits and vegetables. Limit foods that are high in fat  and processed sugars, such as fried or sweet foods. Injury prevention To prevent a future mid-back injury: Always warm up properly before physical activity or sports. Cool down and stretch after being active. Use correct form when playing sports and lifting heavy objects. Bend your knees before you lift heavy objects. Use good posture when sitting and standing. Stay physically  fit and maintain a healthy weight. Do at least 150 minutes of moderate-intensity exercise each week, such as brisk walking or water aerobics. Do strength exercises at least 2 times each week.  General instructions Do not use any products that contain nicotine or tobacco, such as cigarettes, e-cigarettes, and chewing tobacco. If you need help quitting, ask your health care provider. Keep all follow-up visits as told by your health care provider. This is important. Contact a health care provider if: Your pain is not helped by medicine. Your pain or stiffness is getting worse. You develop pain or stiffness in your neck or lower back. Get help right away if you: Have shortness of breath. Have chest pain. Develop numbness or weakness in your legs or arms. Have involuntary loss of urine (urinary incontinence). Summary A thoracic strain, which is sometimes called a mid-back strain, is an injury to the muscles or tendons that attach to the upper part of your back behind your chest. This type of injury occurs when a muscle is overstretched or overloaded. Rest and return to your normal activities as told by your health care provider. If directed, apply heat or ice to the affected area as often as told by your health care provider. Take over-the-counter and prescription medicines only as told by your health care provider. Contact a health care provider if you have new or worsening symptoms. This information is not intended to replace advice given to you by your health care provider. Make sure you discuss any questions you have with your health care provider. Document Revised: 10/28/2020 Document Reviewed: 10/28/2020 Elsevier Patient Education  [place].

## 2021-09-23 NOTE — Progress Notes (Signed)
Acute Office Visit  Subjective:     Patient ID: Theresa Hall, female    DOB: 1940-08-27, 81 y.o.   MRN: [id number]  Chief Complaint  Patient presents with   Back Pain    Upper back onset today, no injury, no rash onset today    HPI Patient is in today for back pain.   BACK PAIN Duration:  started suddenly this morning  Mechanism of injury: no trauma Location: Right and upper back Onset: sudden Quality: sharp and dull Frequency: constant Radiation: none Aggravating factors: movement, deep breaths Alleviating factors: heat and APAP Status: fluctuating Treatments attempted: heat and APAP  Relief with NSAIDs?: No NSAIDs Taken Paresthesias / decreased sensation:  no Bowel / bladder incontinence:  no Fevers:  no Dysuria / urinary frequency:  no No respiratory symptoms, no cough, shortness of breath, wheezing. No history of blood clots, no long car/plane rides.   Review of Systems  Constitutional:  Negative for chills and fever.  Respiratory:  Negative for cough, shortness of breath and wheezing.   Cardiovascular:  Negative for chest pain.  Genitourinary:  Negative for dysuria, flank pain and hematuria.  Musculoskeletal:  Positive for back pain.  Skin:  Negative for rash.  Neurological:  Negative for tingling and weakness.       Objective:    BP 116/80   Pulse 92   Temp 98.3 F (36.8 C)   Resp 16   Ht '5\' 4"'$  (1.626 m)   Wt 159 lb 4.8 oz (72.3 kg)   LMP 01/11/1987 (Approximate)   SpO2 97%   BMI 27.34 kg/m  BP Readings from Last 3 Encounters:  04/07/21 (!) 146/78  02/25/21 114/70  11/04/20 126/74   Wt Readings from Last 3 Encounters:  09/23/21 159 lb 4.8 oz (72.3 kg)  04/07/21 157 lb 8 oz (71.4 kg)  02/25/21 156 lb (70.8 kg)      Physical Exam Constitutional:      Appearance: Normal appearance.  HENT:     Head: Normocephalic and atraumatic.  Eyes:     Conjunctiva/sclera: Conjunctivae normal.  Cardiovascular:     Rate and Rhythm: Normal rate  and regular rhythm.  Pulmonary:     Effort: Pulmonary effort is normal.     Breath sounds: Normal breath sounds.  Musculoskeletal:        General: Tenderness present. No swelling.     Right lower leg: No edema.     Left lower leg: No edema.     Comments: Decreased thoracic range of motion with flexion, right rotation  Skin:    General: Skin is warm and dry.  Neurological:     General: No focal deficit present.     Mental Status: She is alert. Mental status is at baseline.  Psychiatric:        Mood and Affect: Mood normal.        Behavior: Behavior normal.     No results found for any visits on 09/23/21.      Assessment & Plan:   1. Upper back strain, initial encounter: No urinary symptoms, no respiratory symptoms although taking deep inhalations does make symptoms worse. Treat musculoskeletal pain with steroid pack (cannot take NSAIDs due to CKD) and muscle relaxer as needed. Discussed topicals as well and moist heat. Follow up if symptoms worsen or fail to improve.  - methylPREDNISolone (MEDROL DOSEPAK) 4 MG TBPK tablet; Day 1: Take 8 mg (2 tablets) before breakfast, 4 mg (1 tablet) after lunch, 4 mg (  1 tablet) after supper, and 8 mg (2 tablets) at bedtime. Day 2:Take 4 mg (1 tablet) before breakfast, 4 mg (1 tablet) after lunch, 4 mg (1 tablet) after supper, and 8 mg (2 tablets) at bedtime. Day 3: Take 4 mg (1 tablet) before breakfast, 4 mg (1 tablet) after lunch, 4 mg (1 tablet) after supper, and 4 mg (1 tablet) at bedtime. Day 4: Take 4 mg (1 tablet) before breakfast, 4 mg (1 tablet) after lunch, and 4 mg (1 tablet) at bedtime. Day 5: Take 4 mg (1 tablet) before breakfast and 4 mg (1 tablet) at bedtime. Day 6: Take 4 mg (1 tablet) before breakfast.  Dispense: 1 each; Refill: 1 - tiZANidine (ZANAFLEX) 4 MG tablet; Take 1 tablet (4 mg total) by mouth every 6 (six) hours as needed for muscle spasms.  Dispense: 30 tablet; Refill: 0   Return if symptoms worsen or fail to  improve.  Teodora Medici, DO

## 2021-09-27 NOTE — Progress Notes (Unsigned)
Name: Theresa Hall   MRN: [id number]    DOB: 1940-03-17   Date:09/28/2021       Progress Note  Subjective  Chief Complaint  Follow Up  HPI  HTN and she has a history of CKI stage III: reviewed last labs done at nephrologist on 09/14/21 GFR improved - it is 50   She is on low dose of Avapro no chest pain palpitation or dizziness.  Denies pruritus or change in urinary frequency .She does not take NSAID's    Hypothyroidism: TSH was elevated but normal April 2023 . She has been taking Synthroid brand 75 mcg daly and skips once a week. . She denies dry skin, hair loss, change in bowel movement or dysphagia, she has lack of energy that has been chronic and stable.  Fatigue:  gradual over the past 18 months , she states unable to vacuum the entire house, she needs to take a break, she also needs to take a break when mopping the kitchen.It is not associated with diaphoresis, or nausea but feels SOB and resolves with rest. She was seen by cardiologist - Dr. Rockey Situ and had Echo done, unremarkable and advised to go back if worsening of symptoms for Cardiac CTA   Migraine headaches: she had it for many years, she finally came off Topamax a few months ago and no episodes in years   Hyperlipidemia: taking medication and denies side effects, reviewed labs done LDL went up from 88 to 93 . LDL is still below 100, continue current regiment    Osteopenia: discussed results, discussed high calcium and vitamin D diet and she has been taking low dose fosamax without side effects. Reviewed bone density done 02/2021 and showed mild improvement on femur, and slightly worse on wrist from -2.3 to -2.4 , she is taking 70 mg dose    GERD/Hiatal hernia: she states symptoms are intermittent, she eats dinner around 7 pm, smaller portions , she states rarely has symptoms now, she is taking Pepcid prn now   Fatty liver: found on CT abdomen and pelvis. On statin therapy and healthy diet   Thoracic pain : seen by Dr.  Rosana Berger last week, taking steroid taper and is doing much better, pain now is 0/10.   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    Past Surgical History:  Procedure Laterality Date   APPENDECTOMY     BASAL CELL CARCINOMA EXCISION     BASAL CELL CARCINOMA EXCISION  2014   nasal   CATARACT EXTRACTION W/PHACO Left 07/03/2019   Procedure: CATARACT EXTRACTION PHACO AND INTRAOCULAR LENS PLACEMENT ([facility]) LEFT KAHOOK DUAL BLADE GONIOTOMY 9.84 01:14.5 13.2%;  Surgeon: Leandrew Koyanagi, MD;  Location: [facility];  Service: Ophthalmology;  Laterality: Left;   CATARACT EXTRACTION W/PHACO Right 08/07/2019   Procedure: CATARACT EXTRACTION PHACO AND INTRAOCULAR LENS PLACEMENT ([place]) RIGHT KAHOOK DUAL BLADE GONIOTOMY;  Surgeon: Leandrew Koyanagi, MD;  Location: [facility];  Service: Ophthalmology;  Laterality: Right;  6.46 0:59.4 10.9%  CHOLECYSTECTOMY     COLONOSCOPY     ORIF PATELLA Right 08/15/2015   Procedure: OPEN REDUCTION INTERNAL (ORIF) FIXATION PATELLA  and application wound vac;  Surgeon: Corky Mull, MD;  Location: [facility];  Service: Orthopedics;  Laterality: Right;   TONSILLECTOMY     VAGINAL HYSTERECTOMY      Family History  Problem Relation Age of Onset   Heart attack Father        x2   Heart disease Sister    Kidney disease Sister    Diabetes Sister    Heart attack Brother    Diabetes Brother     Heart attack Brother    Breast cancer Neg Hx     Social History   Tobacco Use   Smoking status: Never   Smokeless tobacco: Never   Tobacco comments:    smoking cessation materials not required  Substance Use Topics   Alcohol use: No     Current Outpatient Medications:    alendronate (FOSAMAX) 35 MG tablet, TAKE 2 TABLETS BY MOUTH EVERY 7 (SEVEN) DAYS. TAKE WITH A FULL GLASS OF WATER ON AN EMPTY STOMACH., Disp: 12 tablet, Rfl: 3   alendronate (FOSAMAX) 70 MG tablet, Take 70 mg by mouth once a week., Disp: , Rfl:    azelastine (ASTELIN) 0.1 % nasal spray, PLACE 2 SPRAYS INTO BOTH NOSTRILS 2 (TWO) TIMES DAILY. USE IN EACH NOSTRIL AS DIRECTED, Disp: 30 mL, Rfl: 4   cholecalciferol (VITAMIN D) 1000 units tablet, Take 1 capsule by mouth daily., Disp: , Rfl:    famotidine (PEPCID) 40 MG tablet, TAKE 1 TABLET BY MOUTH EVERY DAY, Disp: 90 tablet, Rfl: 1   Fish Oil-Cholecalciferol (FISH OIL + D3) 1200-1000 MG-UNIT CAPS, Take 1 capsule by mouth daily., Disp: , Rfl:    fluticasone (FLONASE) 50 MCG/ACT nasal spray, SPRAY 1 SPRAY INTO EACH NOSTRIL EVERY DAY, Disp: 48 mL, Rfl: 0   irbesartan (AVAPRO) 75 MG tablet, TAKE 1 TABLET BY MOUTH EVERY DAY, Disp: 90 tablet, Rfl: 0   methylPREDNISolone (MEDROL DOSEPAK) 4 MG TBPK tablet, Day 1: Take 8 mg (2 tablets) before breakfast, 4 mg (1 tablet) after lunch, 4 mg (1 tablet) after supper, and 8 mg (2 tablets) at bedtime. Day 2:Take 4 mg (1 tablet) before breakfast, 4 mg (1 tablet) after lunch, 4 mg (1 tablet) after supper, and 8 mg (2 tablets) at bedtime. Day 3: Take 4 mg (1 tablet) before breakfast, 4 mg (1 tablet) after lunch, 4 mg (1 tablet) after supper, and 4 mg (1 tablet) at bedtime. Day 4: Take 4 mg (1 tablet) before breakfast, 4 mg (1 tablet) after lunch, and 4 mg (1 tablet) at bedtime. Day 5: Take 4 mg (1 tablet) before breakfast and 4 mg (1 tablet) at bedtime. Day 6: Take 4 mg (1 tablet) before breakfast., Disp: 1 each, Rfl: 1   Multiple Vitamin  (MULTIVITAMIN) tablet, Take 1 tablet by mouth daily., Disp: , Rfl:    pravastatin (PRAVACHOL) 80 MG tablet, TAKE 1 TABLET BY MOUTH EVERY DAY, Disp: 90 tablet, Rfl: 0   SYNTHROID 75 MCG tablet, TAKE 1 TABLET (75 MCG TOTAL) BY MOUTH DAILY BEFORE BREAKFAST. ONE DAILY AND SKIP SUNDAYS, Disp: 78 tablet, Rfl: 0   timolol (TIMOPTIC) 0.5 % ophthalmic solution, Administer 1 drop into both eyes 1 (one) time each day in the morning, Disp: , Rfl:    tiZANidine (ZANAFLEX) 4 MG tablet, Take 1 tablet (4 mg total) by mouth every 6 (six) hours as  needed for muscle spasms., Disp: 30 tablet, Rfl: 0   vitamin C (ASCORBIC ACID) 500 MG tablet, Take 500 mg by mouth daily., Disp: , Rfl:   Allergies  Allergen Reactions   Codeine Other (See Comments)    Patient had insomnia and nausea   Oxycodone Other (See Comments)    Made her "Loopy" after surgery     I personally reviewed active problem list, medication list, allergies, family history, social history, health maintenance with the patient/caregiver today.   ROS  Constitutional: Negative for fever or weight change.  Respiratory: Negative for cough and shortness of breath.   Cardiovascular: Negative for chest pain or palpitations.  Gastrointestinal: Negative for abdominal pain, no bowel changes.  Musculoskeletal: Negative for gait problem or joint swelling.  Skin: Negative for rash.  Neurological: Negative for dizziness or headache.  No other specific complaints in a complete review of systems (except as listed in HPI above).   Objective  Vitals:   09/28/21 1058  BP: 122/74  Pulse: 72  Resp: 16  SpO2: 98%  Weight: 159 lb (72.1 kg)  Height: '5\' 4"'$  (1.626 m)    Body mass index is 27.29 kg/m.  Physical Exam  Constitutional: Patient appears well-developed and well-nourished. Overweight.  No distress.  HEENT: head atraumatic, normocephalic, pupils equal and reactive to light, neck supple Cardiovascular: Normal rate, regular rhythm and normal heart  sounds.  No murmur heard. No BLE edema. Pulmonary/Chest: Effort normal and breath sounds normal. No respiratory distress. Abdominal: Soft.  There is no tenderness. Psychiatric: Patient has a normal mood and affect. behavior is normal. Judgment and thought content normal.   PHQ2/9:    09/28/2021   10:58 AM 09/23/2021    1:58 PM 02/25/2021   10:00 AM 11/04/2020    1:15 PM 08/25/2020   11:04 AM  Depression screen PHQ 2/9  Decreased Interest 0 0 0 0 0  Down, Depressed, Hopeless 0 0 0 0 0  PHQ - 2 Score 0 0 0 0 0  Altered sleeping 0 0 0    Tired, decreased energy 0 0 0    Change in appetite 0 0 0    Feeling bad or failure about yourself  0 0 0    Trouble concentrating 0 0 0    Moving slowly or fidgety/restless 0 0 0    Suicidal thoughts 0 0 0    PHQ-9 Score 0 0 0    Difficult doing work/chores  Not difficult at all       phq 9 is negative   Fall Risk:    09/28/2021   10:58 AM 09/23/2021    1:58 PM 02/25/2021   10:00 AM 11/04/2020    1:14 PM 08/25/2020   11:03 AM  Fall Risk   Falls in the past year? 0 0 0 0 0  Number falls in past yr: 0 0 0 0 0  Injury with Fall? 0 0 0 0 0  Risk for fall due to : No Fall Risks  No Fall Risks  No Fall Risks  Follow up Falls prevention discussed  Falls prevention discussed Falls evaluation completed Falls prevention discussed      Functional Status Survey: Is the patient deaf or have difficulty hearing?: No Does the patient have difficulty seeing, even when wearing glasses/contacts?: No Does the patient have difficulty concentrating, remembering, or making decisions?: No Does the patient have difficulty walking or climbing stairs?: No Does the patient have difficulty dressing or bathing?: No Does the patient  have difficulty doing errands alone such as visiting a doctor's office or shopping?: No    Assessment & Plan  1. Benign hypertension with chronic kidney disease, stage III (HCC)  - irbesartan (AVAPRO) 75 MG tablet; Take 1 tablet (75  mg total) by mouth daily.  Dispense: 90 tablet; Refill: 1  2. Stage 3a chronic kidney disease (HCC)  - irbesartan (AVAPRO) 75 MG tablet; Take 1 tablet (75 mg total) by mouth daily.  Dispense: 90 tablet; Refill: 1  3. GERD without esophagitis   4. Adult hypothyroidism  Recheck TSH  5. Migraine without aura and without status migrainosus, not intractable   6. Vitamin D deficiency   7. Dyslipidemia  - pravastatin (PRAVACHOL) 80 MG tablet; Take 1 tablet (80 mg total) by mouth daily.  Dispense: 90 tablet; Refill: 1  8. Hypertension, benign  - irbesartan (AVAPRO) 75 MG tablet; Take 1 tablet (75 mg total) by mouth daily.  Dispense: 90 tablet; Refill: 1  9. Fatty liver

## 2021-09-28 ENCOUNTER — Ambulatory Visit (INDEPENDENT_AMBULATORY_CARE_PROVIDER_SITE_OTHER): Payer: Medicare HMO | Admitting: Family Medicine

## 2021-09-28 ENCOUNTER — Encounter: Payer: Self-pay | Admitting: Family Medicine

## 2021-09-28 VITALS — BP 122/74 | HR 72 | Resp 16 | Ht 64.0 in | Wt 159.0 lb

## 2021-09-28 DIAGNOSIS — I10 Essential (primary) hypertension: Secondary | ICD-10-CM

## 2021-09-28 DIAGNOSIS — N183 Chronic kidney disease, stage 3 unspecified: Secondary | ICD-10-CM | POA: Diagnosis not present

## 2021-09-28 DIAGNOSIS — E785 Hyperlipidemia, unspecified: Secondary | ICD-10-CM | POA: Diagnosis not present

## 2021-09-28 DIAGNOSIS — K760 Fatty (change of) liver, not elsewhere classified: Secondary | ICD-10-CM | POA: Diagnosis not present

## 2021-09-28 DIAGNOSIS — K219 Gastro-esophageal reflux disease without esophagitis: Secondary | ICD-10-CM

## 2021-09-28 DIAGNOSIS — E039 Hypothyroidism, unspecified: Secondary | ICD-10-CM

## 2021-09-28 DIAGNOSIS — G43009 Migraine without aura, not intractable, without status migrainosus: Secondary | ICD-10-CM

## 2021-09-28 DIAGNOSIS — I129 Hypertensive chronic kidney disease with stage 1 through stage 4 chronic kidney disease, or unspecified chronic kidney disease: Secondary | ICD-10-CM

## 2021-09-28 DIAGNOSIS — N1831 Chronic kidney disease, stage 3a: Secondary | ICD-10-CM

## 2021-09-28 DIAGNOSIS — E559 Vitamin D deficiency, unspecified: Secondary | ICD-10-CM | POA: Diagnosis not present

## 2021-09-28 MED ORDER — IRBESARTAN 75 MG PO TABS: 75.0000 mg | ORAL_TABLET | Freq: Every day | ORAL | 1 refills | Status: DC

## 2021-09-28 MED ORDER — PRAVASTATIN SODIUM 80 MG PO TABS: 80.0000 mg | ORAL_TABLET | Freq: Every day | ORAL | 1 refills | Status: DC

## 2021-09-28 MED ORDER — ALENDRONATE SODIUM 70 MG PO TABS: 70.0000 mg | ORAL_TABLET | ORAL | 1 refills | Status: DC

## 2021-09-28 NOTE — Patient Instructions (Signed)
You are due for  COVID-19 booster Shingrix vaccine Tdap RSV Flu

## 2021-09-29 ENCOUNTER — Other Ambulatory Visit: Payer: Self-pay | Admitting: Family Medicine

## 2021-09-29 DIAGNOSIS — E039 Hypothyroidism, unspecified: Secondary | ICD-10-CM

## 2021-09-29 LAB — TSH: TSH: 9.13 mIU/L — ABNORMAL HIGH (ref 0.40–4.50)

## 2021-09-29 MED ORDER — SYNTHROID 75 MCG PO TABS: 75.0000 ug | ORAL_TABLET | Freq: Every day | ORAL | 0 refills | Status: DC

## 2021-10-01 ENCOUNTER — Other Ambulatory Visit: Payer: Self-pay | Admitting: Family Medicine

## 2021-10-01 ENCOUNTER — Ambulatory Visit: Payer: Self-pay

## 2021-10-01 DIAGNOSIS — E039 Hypothyroidism, unspecified: Secondary | ICD-10-CM

## 2021-10-01 NOTE — Telephone Encounter (Signed)
  Chief Complaint: COVID exposure last weekend Symptoms: Body aches Frequency: 6 days Pertinent Negatives: Patient denies  Disposition: '[]'$ ED /'[]'$ Urgent Care (no appt availability in office) / '[]'$ Appointment(In office/virtual)/ '[]'$  [facility]/ '[]'$ Home Care/ '[]'$ Refused Recommended Disposition /'[]'$ [facility]/ '[x]'$  Follow-up with PCP Additional Notes: PT will go to UC for testing. Pt will call back if needed. PT will want antivirals if COVID +.    Reason for Disposition  [1] COVID-19 EXPOSURE within last 14 days AND [2] NO symptoms  Answer Assessment - Initial Assessment Questions 1. COVID-19 EXPOSURE: "Please describe how you were exposed to someone with a COVID-19 infection." Pt was with someone for an extended time last Saturday who has tested + for COVID      2. PLACE of CONTACT: "Where were you when you were exposed to COVID-19?" (e.g., home, school, medical waiting room; which city?)     Car 3. TYPE of CONTACT: "How much contact was there?" (e.g., sitting next to, live in same house, work in same office, same building)     Same car next to each other at conference 4. DURATION of CONTACT: "How long were you in contact with the COVID-19 patient?" (e.g., a few seconds, passed by person, a few minutes, 15 minutes or longer, live with the patient)     5 hours 5. MASK: "Were you wearing a mask?" "Was the other person wearing a mask?" Note: wearing a mask reduces the risk of an otherwise close contact.     no 6. DATE of CONTACT: "When did you have contact with a COVID-19 patient?" (e.g., how many days ago)     6 days 7. COMMUNITY SPREAD: "Do you live in or have you traveled to an area where there are lots of COVID-19 cases (community spread)?" (See public health department website, if unsure)        8. SYMPTOMS: "Do you have any symptoms?" (e.g., fever, cough, breathing difficulty, loss of taste or smell)     BA 9. VACCINE: "Have you gotten the COVID-19 vaccine?" If Yes,  ask: "Which one, how many shots, when did you get it?"      10. PREGNANCY OR POSTPARTUM: "Is there any chance you are pregnant?" "When was your last menstrual period?" "Did you deliver in the last 2 weeks?"        11. HIGH RISK: "Do you have any heart or lung problems?" (e.g., asthma, COPD, heart failure) "Do you have a weak immune system or other risk factors?" (e.g., HIV positive, chemotherapy, renal failure, diabetes mellitus, sickle cell anemia, obesity)  Protocols used: Coronavirus (COVID-19) Exposure-A-AH

## 2021-11-02 DIAGNOSIS — D2261 Melanocytic nevi of right upper limb, including shoulder: Secondary | ICD-10-CM | POA: Diagnosis not present

## 2021-11-02 DIAGNOSIS — Z85820 Personal history of malignant melanoma of skin: Secondary | ICD-10-CM | POA: Diagnosis not present

## 2021-11-02 DIAGNOSIS — D225 Melanocytic nevi of trunk: Secondary | ICD-10-CM | POA: Diagnosis not present

## 2021-11-02 DIAGNOSIS — L309 Dermatitis, unspecified: Secondary | ICD-10-CM | POA: Diagnosis not present

## 2021-11-02 DIAGNOSIS — D485 Neoplasm of uncertain behavior of skin: Secondary | ICD-10-CM | POA: Diagnosis not present

## 2021-11-02 DIAGNOSIS — Z85828 Personal history of other malignant neoplasm of skin: Secondary | ICD-10-CM | POA: Diagnosis not present

## 2021-11-02 DIAGNOSIS — D2272 Melanocytic nevi of left lower limb, including hip: Secondary | ICD-10-CM | POA: Diagnosis not present

## 2022-01-23 ENCOUNTER — Other Ambulatory Visit: Payer: Self-pay | Admitting: Family Medicine

## 2022-01-23 DIAGNOSIS — K219 Gastro-esophageal reflux disease without esophagitis: Secondary | ICD-10-CM

## 2022-02-22 ENCOUNTER — Other Ambulatory Visit: Payer: Self-pay | Admitting: Family Medicine

## 2022-02-22 DIAGNOSIS — Z1231 Encounter for screening mammogram for malignant neoplasm of breast: Secondary | ICD-10-CM

## 2022-03-02 DIAGNOSIS — H401131 Primary open-angle glaucoma, bilateral, mild stage: Secondary | ICD-10-CM | POA: Diagnosis not present

## 2022-03-12 ENCOUNTER — Other Ambulatory Visit: Payer: Self-pay | Admitting: Family Medicine

## 2022-03-12 DIAGNOSIS — E039 Hypothyroidism, unspecified: Secondary | ICD-10-CM

## 2022-03-15 DIAGNOSIS — Z961 Presence of intraocular lens: Secondary | ICD-10-CM | POA: Diagnosis not present

## 2022-03-15 DIAGNOSIS — H401131 Primary open-angle glaucoma, bilateral, mild stage: Secondary | ICD-10-CM | POA: Diagnosis not present

## 2022-03-22 ENCOUNTER — Ambulatory Visit
Admission: RE | Admit: 2022-03-22 | Discharge: 2022-03-22 | Disposition: A | Discharge status: Home / Self Care | Payer: Medicare HMO | Source: Ambulatory Visit | Attending: Family Medicine | Admitting: Family Medicine

## 2022-03-22 DIAGNOSIS — Z1231 Encounter for screening mammogram for malignant neoplasm of breast: Secondary | ICD-10-CM | POA: Diagnosis not present

## 2022-03-25 ENCOUNTER — Other Ambulatory Visit: Payer: Self-pay | Admitting: Family Medicine

## 2022-03-25 DIAGNOSIS — R928 Other abnormal and inconclusive findings on diagnostic imaging of breast: Secondary | ICD-10-CM

## 2022-03-25 DIAGNOSIS — N6489 Other specified disorders of breast: Secondary | ICD-10-CM

## 2022-03-25 DIAGNOSIS — E785 Hyperlipidemia, unspecified: Secondary | ICD-10-CM

## 2022-03-28 NOTE — Progress Notes (Unsigned)
Name: Theresa Hall   MRN: [id number]    DOB: [birth year]   Date:03/29/2022       Progress Note  Subjective  Chief Complaint  Follow Up  HPI  HTN associated with CKI stage III: reviewed last labs done at nephrologist on 09/14/21 GFR improved - it is 50   She is on low dose of Avapro no chest pain palpitation or dizziness.  Denies pruritus or change in urinary frequency .Reminded her to avoid NSAID's    Hypothyroidism: TSH was high again in September, she is currently taking 75 mcg once daily and we will recheck labs today  . She denies dry skin, hair loss, change in bowel movement or dysphagia, she has lack of energy that has been chronic and stable.  Fatigue: it is going on for the past 2 years, she states unable to vacuum the entire house, she also needs to take a break when mopping the kitchen.It is not associated with diaphoresis, or nausea but feels SOB and resolves with rest. She was seen by cardiologist - Dr. Rockey Situ and had Echo done, unremarkable and advised to go back if worsening of symptoms for Cardiac CTA. She states symptoms are stable, she states she has a busy life   Migraine headaches: no episodes in over 14 years, last migraine when her father died,  she finally came off Topamax last year and no recurrence   Hyperlipidemia: taking medication and denies side effects, reviewed labs done LDL went up from 88 to 63  We will repeat labs today .    Osteopenia: discussed results, discussed high calcium and vitamin D diet and she has been taking low dose fosamax without side effects. Reviewed bone density done 02/2021 and showed mild improvement on femur, and slightly worse on wrist from -2.3 to -2.4 , she is taking 70 mg dose , we will recheck level next year    GERD/Hiatal hernia: she states symptoms are intermittent, she eats dinner around 7 pm, smaller portions , she states rarely has symptoms now, she is taking Pepcid prn now Unchanged   Fatty liver: found on CT abdomen and  pelvis. On statin therapy and healthy diet . We will recheck liver enzymes today   Abnormal mammogram: she is going for add views tomorrow on left breast  Patient Active Problem List   Diagnosis Date Noted   Benign hypertensive kidney disease with chronic kidney disease 07/22/2019   BCC (basal cell carcinoma of skin) 10/16/2017   Secondary hyperparathyroidism of renal origin ([place]) 07/18/2017   Osteopenia 08/15/2016   Closed displaced comminuted fracture of right patella 08/20/2015   Reflux esophagitis 06/23/2014   HLD (hyperlipidemia) 06/23/2014   Palpitations 06/23/2014   Chronic kidney disease (CKD), stage III (moderate) ([place]) 08/24/2009   History of diverticulitis 05/14/2008   Adult hypothyroidism 12/04/2006   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    Past Surgical History:  Procedure Laterality Date   APPENDECTOMY     BASAL CELL CARCINOMA EXCISION     BASAL CELL CARCINOMA EXCISION  2014   nasal   CATARACT EXTRACTION W/PHACO Left 07/03/2019   Procedure: CATARACT EXTRACTION PHACO AND INTRAOCULAR LENS PLACEMENT ([facility]) LEFT KAHOOK DUAL BLADE GONIOTOMY 9.84 01:14.5 13.2%;  Surgeon: Leandrew Koyanagi, MD;  Location: [facility];  Service: Ophthalmology;  Laterality: Left;   CATARACT EXTRACTION W/PHACO Right 08/07/2019   Procedure: CATARACT EXTRACTION PHACO AND INTRAOCULAR LENS PLACEMENT ([place]) RIGHT KAHOOK DUAL BLADE GONIOTOMY;  Surgeon: Leandrew Koyanagi, MD;  Location: [facility];  Service:  Ophthalmology;  Laterality: Right;  6.46 0:59.4 10.9%   CHOLECYSTECTOMY     COLONOSCOPY     ORIF PATELLA Right 08/15/2015   Procedure: OPEN REDUCTION INTERNAL (ORIF) FIXATION PATELLA  and application wound vac;  Surgeon: Corky Mull, MD;  Location: [facility];  Service: Orthopedics;  Laterality: Right;   TONSILLECTOMY     VAGINAL HYSTERECTOMY      Family History  Problem Relation Age of Onset   Heart attack Father        x2   Heart disease Sister    Kidney disease  Sister    Diabetes Sister    Heart attack Brother    Diabetes Brother    Heart attack Brother    Breast cancer Neg Hx     Social History   Tobacco Use   Smoking status: Never   Smokeless tobacco: Never   Tobacco comments:    smoking cessation materials not required  Substance Use Topics   Alcohol use: No     Current Outpatient Medications:    alendronate (FOSAMAX) 70 MG tablet, Take 1 tablet (70 mg total) by mouth once a week., Disp: 90 tablet, Rfl: 1   azelastine (ASTELIN) 0.1 % nasal spray, PLACE 2 SPRAYS INTO BOTH NOSTRILS 2 (TWO) TIMES DAILY. USE IN EACH NOSTRIL AS DIRECTED, Disp: 30 mL, Rfl: 4   cholecalciferol (VITAMIN D) 1000 units tablet, Take 1 capsule by mouth once a week., Disp: , Rfl:    famotidine (PEPCID) 40 MG tablet, TAKE 1 TABLET BY MOUTH EVERY DAY, Disp: 90 tablet, Rfl: 1   fluticasone (FLONASE) 50 MCG/ACT nasal spray, SPRAY 1 SPRAY INTO EACH NOSTRIL EVERY DAY, Disp: 48 mL, Rfl: 0   irbesartan (AVAPRO) 75 MG tablet, Take 1 tablet (75 mg total) by mouth daily., Disp: 90 tablet, Rfl: 1   Multiple Vitamin (MULTIVITAMIN) tablet, Take 1 tablet by mouth daily., Disp: , Rfl:    pravastatin (PRAVACHOL) 80 MG tablet, Take 1 tablet (80 mg total) by mouth daily., Disp: 90 tablet, Rfl: 1   SYNTHROID 75 MCG tablet, TAKE 1 TABLET BY MOUTH DAILY BEFORE BREAKFAST., Disp: 30 tablet, Rfl: 0   timolol (TIMOPTIC) 0.5 % ophthalmic solution, Place 1 drop into both eyes 2 (two) times daily., Disp: , Rfl:    tiZANidine (ZANAFLEX) 4 MG tablet, Take 1 tablet (4 mg total) by mouth every 6 (six) hours as needed for muscle spasms., Disp: 30 tablet, Rfl: 0   vitamin C (ASCORBIC ACID) 500 MG tablet, Take 500 mg by mouth daily., Disp: , Rfl:    Fish Oil-Cholecalciferol (FISH OIL + D3) 1200-1000 MG-UNIT CAPS, Take 1 capsule by mouth daily. (Patient not taking: Reported on 03/29/2022), Disp: , Rfl:   Allergies  Allergen Reactions   Codeine Other (See Comments)    Patient had insomnia and nausea    Oxycodone Other (See Comments)    Made her "Loopy" after surgery     I personally reviewed active problem list, medication list, allergies, family history, social history, health maintenance with the patient/caregiver today.   ROS  Constitutional: Negative for fever or weight change.  Respiratory: Negative for cough and shortness of breath.   Cardiovascular: Negative for chest pain or palpitations.  Gastrointestinal: Negative for abdominal pain, no bowel changes.  Musculoskeletal: Negative for gait problem or joint swelling.  Skin: Negative for rash.  Neurological: Negative for dizziness or headache.  No other specific complaints in a complete review of systems (except as listed in HPI above).  Objective  Vitals:   03/29/22 0934  BP: 122/68  Pulse: 73  Resp: 16  SpO2: 99%  Weight: 156 lb (70.8 kg)  Height: 5\' 4"  (1.626 m)    Body mass index is 26.78 kg/m.  Physical Exam  Constitutional: Patient appears well-developed and well-nourished.  No distress.  HEENT: head atraumatic, normocephalic, pupils equal and reactive to light, neck supple Cardiovascular: Normal rate, regular rhythm and normal heart sounds.  No murmur heard. No BLE edema. Pulmonary/Chest: Effort normal and breath sounds normal. No respiratory distress. Abdominal: Soft.  There is no tenderness. Psychiatric: Patient has a normal mood and affect. behavior is normal. Judgment and thought content normal.    PHQ2/9:    03/29/2022    9:34 AM 09/28/2021   10:58 AM 09/23/2021    1:58 PM 02/25/2021   10:00 AM 11/04/2020    1:15 PM  Depression screen PHQ 2/9  Decreased Interest 0 0 0 0 0  Down, Depressed, Hopeless 0 0 0 0 0  PHQ - 2 Score 0 0 0 0 0  Altered sleeping 0 0 0 0   Tired, decreased energy 0 0 0 0   Change in appetite 0 0 0 0   Feeling bad or failure about yourself  0 0 0 0   Trouble concentrating 0 0 0 0   Moving slowly or fidgety/restless 0 0 0 0   Suicidal thoughts 0 0 0 0   PHQ-9 Score 0  0 0 0   Difficult doing work/chores   Not difficult at all      phq 9 is negative   Fall Risk:    03/29/2022    9:34 AM 09/28/2021   10:58 AM 09/23/2021    1:58 PM 02/25/2021   10:00 AM 11/04/2020    1:14 PM  Fall Risk   Falls in the past year? 0 0 0 0 0  Number falls in past yr: 0 0 0 0 0  Injury with Fall? 0 0 0 0 0  Risk for fall due to : No Fall Risks No Fall Risks  No Fall Risks   Follow up Falls prevention discussed Falls prevention discussed  Falls prevention discussed Falls evaluation completed     Functional Status Survey: Is the patient deaf or have difficulty hearing?: No Does the patient have difficulty seeing, even when wearing glasses/contacts?: No Does the patient have difficulty concentrating, remembering, or making decisions?: No Does the patient have difficulty walking or climbing stairs?: No Does the patient have difficulty dressing or bathing?: No Does the patient have difficulty doing errands alone such as visiting a doctor's office or shopping?: No    Assessment & Plan  1. Benign hypertension with chronic kidney disease, stage III (HCC)  - irbesartan (AVAPRO) 75 MG tablet; Take 1 tablet (75 mg total) by mouth daily.  Dispense: 90 tablet; Refill: 1  2. Secondary hyperparathyroidism of renal origin [facility])  We will recheck pth  3. Stage 3a chronic kidney disease (HCC)  - CBC with Differential/Platelet - COMPLETE METABOLIC PANEL WITH GFR - irbesartan (AVAPRO) 75 MG tablet; Take 1 tablet (75 mg total) by mouth daily.  Dispense: 90 tablet; Refill: 1  4. Vitamin D deficiency  - VITAMIN D 25 Hydroxy (Vit-D Deficiency, Fractures)  5. Adult hypothyroidism  - TSH  6. Migraine without aura and without status migrainosus, not intractable  No problems   7. Fatty liver  Recheck labs   8. Dyslipidemia  - Lipid panel - pravastatin (PRAVACHOL) 80  MG tablet; Take 1 tablet (80 mg total) by mouth daily.  Dispense: 90 tablet; Refill: 1  9. GERD without  esophagitis  Stable  10. Perennial allergic rhinitis with seasonal variation  We will send flonase and atrovent due to recurrent rhinorrhea   11. Osteopenia after menopause  - alendronate (FOSAMAX) 70 MG tablet; Take 1 tablet (70 mg total) by mouth once a week.  Dispense: 90 tablet; Refill: 1  12. Hypertension, benign  - irbesartan (AVAPRO) 75 MG tablet; Take 1 tablet (75 mg total) by mouth daily.  Dispense: 90 tablet; Refill: 1

## 2022-03-29 ENCOUNTER — Ambulatory Visit (INDEPENDENT_AMBULATORY_CARE_PROVIDER_SITE_OTHER): Payer: Medicare HMO | Admitting: Family Medicine

## 2022-03-29 ENCOUNTER — Encounter: Payer: Self-pay | Admitting: Family Medicine

## 2022-03-29 VITALS — BP 122/68 | HR 73 | Resp 16 | Ht 64.0 in | Wt 156.0 lb

## 2022-03-29 DIAGNOSIS — N2581 Secondary hyperparathyroidism of renal origin: Secondary | ICD-10-CM | POA: Diagnosis not present

## 2022-03-29 DIAGNOSIS — M8580 Other specified disorders of bone density and structure, unspecified site: Secondary | ICD-10-CM

## 2022-03-29 DIAGNOSIS — K219 Gastro-esophageal reflux disease without esophagitis: Secondary | ICD-10-CM | POA: Diagnosis not present

## 2022-03-29 DIAGNOSIS — N183 Hypertensive chronic kidney disease with stage 1 through stage 4 chronic kidney disease, or unspecified chronic kidney disease: Secondary | ICD-10-CM | POA: Insufficient documentation

## 2022-03-29 DIAGNOSIS — E039 Hypothyroidism, unspecified: Secondary | ICD-10-CM

## 2022-03-29 DIAGNOSIS — Z780 Asymptomatic menopausal state: Secondary | ICD-10-CM

## 2022-03-29 DIAGNOSIS — N1831 Chronic kidney disease, stage 3a: Secondary | ICD-10-CM | POA: Diagnosis not present

## 2022-03-29 DIAGNOSIS — I129 Hypertensive chronic kidney disease with stage 1 through stage 4 chronic kidney disease, or unspecified chronic kidney disease: Secondary | ICD-10-CM | POA: Diagnosis not present

## 2022-03-29 DIAGNOSIS — I10 Essential (primary) hypertension: Secondary | ICD-10-CM | POA: Diagnosis not present

## 2022-03-29 DIAGNOSIS — J302 Other seasonal allergic rhinitis: Secondary | ICD-10-CM | POA: Insufficient documentation

## 2022-03-29 DIAGNOSIS — E559 Vitamin D deficiency, unspecified: Secondary | ICD-10-CM | POA: Diagnosis not present

## 2022-03-29 DIAGNOSIS — J3089 Other allergic rhinitis: Secondary | ICD-10-CM | POA: Diagnosis not present

## 2022-03-29 DIAGNOSIS — M810 Age-related osteoporosis without current pathological fracture: Secondary | ICD-10-CM | POA: Insufficient documentation

## 2022-03-29 DIAGNOSIS — K760 Fatty (change of) liver, not elsewhere classified: Secondary | ICD-10-CM | POA: Insufficient documentation

## 2022-03-29 DIAGNOSIS — E785 Hyperlipidemia, unspecified: Secondary | ICD-10-CM | POA: Diagnosis not present

## 2022-03-29 DIAGNOSIS — G43009 Migraine without aura, not intractable, without status migrainosus: Secondary | ICD-10-CM | POA: Diagnosis not present

## 2022-03-29 MED ORDER — PRAVASTATIN SODIUM 80 MG PO TABS: 80.0000 mg | ORAL_TABLET | Freq: Every day | ORAL | 1 refills | Status: DC

## 2022-03-29 MED ORDER — IPRATROPIUM BROMIDE 0.06 % NA SOLN: 2.0000 | Freq: Four times a day (QID) | NASAL | 1 refills | Status: DC

## 2022-03-29 MED ORDER — IRBESARTAN 75 MG PO TABS: 75.0000 mg | ORAL_TABLET | Freq: Every day | ORAL | 1 refills | Status: DC

## 2022-03-29 MED ORDER — ALENDRONATE SODIUM 70 MG PO TABS: 70.0000 mg | ORAL_TABLET | ORAL | 1 refills | Status: DC

## 2022-03-29 MED ORDER — FLUTICASONE PROPIONATE 50 MCG/ACT NA SUSP: NASAL | 0 refills | Status: DC

## 2022-03-30 ENCOUNTER — Other Ambulatory Visit: Payer: Self-pay | Admitting: Family Medicine

## 2022-03-30 ENCOUNTER — Ambulatory Visit
Admission: RE | Admit: 2022-03-30 | Discharge: 2022-03-30 | Disposition: A | Discharge status: Home / Self Care | Payer: Medicare HMO | Source: Ambulatory Visit | Attending: Family Medicine | Admitting: Family Medicine

## 2022-03-30 DIAGNOSIS — R928 Other abnormal and inconclusive findings on diagnostic imaging of breast: Secondary | ICD-10-CM

## 2022-03-30 DIAGNOSIS — N6489 Other specified disorders of breast: Secondary | ICD-10-CM | POA: Diagnosis not present

## 2022-03-30 DIAGNOSIS — E039 Hypothyroidism, unspecified: Secondary | ICD-10-CM

## 2022-03-30 DIAGNOSIS — N6323 Unspecified lump in the left breast, lower outer quadrant: Secondary | ICD-10-CM | POA: Diagnosis not present

## 2022-03-30 LAB — COMPLETE METABOLIC PANEL WITH GFR
AG Ratio: 1.5 (calc) (ref 1.0–2.5)
ALT: 27 U/L (ref 6–29)
AST: 22 U/L (ref 10–35)
Albumin: 4.5 g/dL (ref 3.6–5.1)
Alkaline phosphatase (APISO): 49 U/L (ref 37–153)
BUN/Creatinine Ratio: 17 (calc) (ref 6–22)
BUN: 17 mg/dL (ref 7–25)
CO2: 26 mmol/L (ref 20–32)
Calcium: 9.9 mg/dL (ref 8.6–10.4)
Chloride: 106 mmol/L (ref 98–110)
Creat: 1.01 mg/dL — ABNORMAL HIGH (ref 0.60–0.95)
Globulin: 3 g/dL (calc) (ref 1.9–3.7)
Glucose, Bld: 110 mg/dL — ABNORMAL HIGH (ref 65–99)
Potassium: 4.7 mmol/L (ref 3.5–5.3)
Sodium: 141 mmol/L (ref 135–146)
Total Bilirubin: 0.6 mg/dL (ref 0.2–1.2)
Total Protein: 7.5 g/dL (ref 6.1–8.1)
eGFR: 55 mL/min/{1.73_m2} — ABNORMAL LOW (ref >=60)

## 2022-03-30 LAB — LIPID PANEL
Cholesterol: 170 mg/dL (ref <200)
HDL: 54 mg/dL (ref >=50)
LDL Cholesterol (Calc): 87 mg/dL (calc)
Non-HDL Cholesterol (Calc): 116 mg/dL (calc) (ref <130)
Total CHOL/HDL Ratio: 3.1 (calc) (ref <5.0)
Triglycerides: 203 mg/dL — ABNORMAL HIGH (ref <150)

## 2022-03-30 LAB — CBC WITH DIFFERENTIAL/PLATELET
Absolute Monocytes: 427 cells/uL (ref 200–950)
Basophils Absolute: 67 cells/uL (ref 0–200)
Basophils Relative: 1.4 %
Eosinophils Absolute: 110 cells/uL (ref 15–500)
Eosinophils Relative: 2.3 %
HCT: 39.5 % (ref 35.0–45.0)
Hemoglobin: 13.4 g/dL (ref 11.7–15.5)
Lymphs Abs: 1728 cells/uL (ref 850–3900)
MCH: 31.3 pg (ref 27.0–33.0)
MCHC: 33.9 g/dL (ref 32.0–36.0)
MCV: 92.3 fL (ref 80.0–100.0)
MPV: 11.9 fL (ref 7.5–12.5)
Monocytes Relative: 8.9 %
Neutro Abs: 2467 cells/uL (ref 1500–7800)
Neutrophils Relative %: 51.4 %
Platelets: 215 10*3/uL (ref 140–400)
RBC: 4.28 10*6/uL (ref 3.80–5.10)
RDW: 12.4 % (ref 11.0–15.0)
Total Lymphocyte: 36 %
WBC: 4.8 10*3/uL (ref 3.8–10.8)

## 2022-03-30 LAB — PARATHYROID HORMONE, INTACT (NO CA): PTH: 43 pg/mL (ref 16–77)

## 2022-03-30 LAB — TSH: TSH: 1 mIU/L (ref 0.40–4.50)

## 2022-03-30 LAB — VITAMIN D 25 HYDROXY (VIT D DEFICIENCY, FRACTURES): Vit D, 25-Hydroxy: 47 ng/mL (ref 30–100)

## 2022-03-30 MED ORDER — SYNTHROID 75 MCG PO TABS: 75.0000 ug | ORAL_TABLET | Freq: Every day | ORAL | 1 refills | Status: DC

## 2022-03-31 ENCOUNTER — Other Ambulatory Visit: Payer: Self-pay | Admitting: Family Medicine

## 2022-03-31 DIAGNOSIS — R928 Other abnormal and inconclusive findings on diagnostic imaging of breast: Secondary | ICD-10-CM

## 2022-03-31 DIAGNOSIS — N63 Unspecified lump in unspecified breast: Secondary | ICD-10-CM

## 2022-04-08 ENCOUNTER — Ambulatory Visit
Admission: RE | Admit: 2022-04-08 | Discharge: 2022-04-08 | Disposition: A | Discharge status: Home / Self Care | Payer: Medicare HMO | Source: Ambulatory Visit | Attending: Family Medicine | Admitting: Family Medicine

## 2022-04-08 DIAGNOSIS — R928 Other abnormal and inconclusive findings on diagnostic imaging of breast: Secondary | ICD-10-CM | POA: Insufficient documentation

## 2022-04-08 DIAGNOSIS — N63 Unspecified lump in unspecified breast: Secondary | ICD-10-CM

## 2022-04-08 DIAGNOSIS — D242 Benign neoplasm of left breast: Secondary | ICD-10-CM | POA: Diagnosis not present

## 2022-04-08 DIAGNOSIS — N6323 Unspecified lump in the left breast, lower outer quadrant: Secondary | ICD-10-CM | POA: Diagnosis not present

## 2022-04-08 HISTORY — PX: BREAST BIOPSY: SHX20

## 2022-04-08 MED ORDER — LIDOCAINE HCL (PF) 1 % IJ SOLN
2.0000 mL | Freq: Once | INTRAMUSCULAR | Status: AC
  Administered 2022-04-08: 2 mL via INTRADERMAL

## 2022-04-08 MED ORDER — LIDOCAINE-EPINEPHRINE 1 %-1:100000 IJ SOLN
8.0000 mL | Freq: Once | INTRAMUSCULAR | Status: AC
  Administered 2022-04-08: 8 mL

## 2022-04-08 MED ORDER — LIDOCAINE HCL (PF) 1 % IJ SOLN
3.0000 mL | Freq: Once | INTRAMUSCULAR | Status: AC
  Administered 2022-04-08: 3 mL via INTRADERMAL

## 2022-04-11 ENCOUNTER — Encounter: Payer: Self-pay | Admitting: *Deleted

## 2022-04-11 HISTORY — PX: BREAST LUMPECTOMY: SHX2

## 2022-04-11 LAB — SURGICAL PATHOLOGY

## 2022-04-11 NOTE — Progress Notes (Signed)
Referral recieved from [facility] for benign breast mass.  Appointment scheduled for surgical consultation with Dr. Lysle Pearl for 04/18/22 at 11:15.   No further needs at this time.

## 2022-04-18 DIAGNOSIS — D242 Benign neoplasm of left breast: Secondary | ICD-10-CM | POA: Diagnosis not present

## 2022-04-19 ENCOUNTER — Ambulatory Visit: Payer: Self-pay | Admitting: Surgery

## 2022-04-19 ENCOUNTER — Encounter: Payer: Self-pay | Admitting: Surgery

## 2022-04-19 ENCOUNTER — Telehealth: Payer: Self-pay | Admitting: Family Medicine

## 2022-04-19 NOTE — Telephone Encounter (Signed)
Contacted Fae Pippin to schedule their annual wellness visit. Appointment made for 08/18/2022.  [facility] Care Guide [facility] AWV TEAM Direct Dial: [phone]

## 2022-04-19 NOTE — H&P (Signed)
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 female who was referred by Ruel Favors, MD for evaluation of above. Change was noted on last screening mammogram. Recently noted to have papilloma on breast biopsy. Here to discuss further management.   Past Medical History:  has a past medical history of Allergy to mold, Chickenpox, GERD (gastroesophageal reflux disease), Hyperlipidemia, Hypothyroidism, and Seasonal allergies.  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  Family History: family history includes Cirrhosis in her brother; Diabetes in her father and sister; Diabetes type I in her brother; Heart failure in her sister; Kidney failure in her sister; Multiple sclerosis in her mother; Myocardial Infarction (Heart attack) in her brother and father.  Social History:  reports that she has never smoked. She has never used smokeless tobacco. She reports that she does not drink alcohol and does not use drugs.  Current Medications: has a current medication list which includes the following prescription(s): acetaminophen, alendronate, ascorbic acid (vitamin c), biotin, calcium polycarbophil, cholecalciferol, famotidine, fluticasone propionate, ipratropium, irbesartan, levothyroxine, multivitamin, pravastatin, and timolol maleate.  Allergies:  Allergies as of 04/18/2022 - Reviewed 04/18/2022  Allergen Reaction Noted   Codeine Other (See Comments) 06/23/2014   Oxycodone Other (See Comments) 08/31/2015    ROS:  A 15 point review of systems was performed and was negative except as noted in HPI   Objective:     BP (!)  148/81   Pulse 74   Ht 162.6 cm (5\' 4" )   Wt 69.4 kg (153 lb)   BMI 26.26 kg/m   Constitutional :  No distress, cooperative, alert  Lymphatics/Throat:  Supple with no lymphadenopathy  Respiratory:  Clear to auscultation bilaterally  Cardiovascular:  Regular rate and rhythm  Gastrointestinal: Soft, non-tender, non-distended, no organomegaly.  Musculoskeletal: Steady gait and movement  Skin: Cool and moist  Psychiatric: Normal affect, non-agitated, not confused  Breast: deferred      LABS:  N/a   RADS: CLINICAL DATA:  Left breast lower outer quadrant possible asymmetry  seen on most recent screening mammography.   EXAM:  DIGITAL DIAGNOSTIC UNILATERAL LEFT MAMMOGRAM WITH TOMOSYNTHESIS;  ULTRASOUND LEFT BREAST LIMITED   TECHNIQUE:  Left digital diagnostic mammography and breast tomosynthesis was  performed.; Targeted ultrasound examination of the left breast was  performed.   COMPARISON: Previous exam(s).   ACR Breast Density Category b: There are scattered areas of  fibroglandular density.   FINDINGS:  Additional mammographic views of the left breast demonstrate  persistent circumscribed fat containing mass in the lower outer left  breast, anterior depth. The mammographic appearance is suggestive of  a benign intramammary lymph node.   Targeted left breast ultrasound is performed demonstrating 4 o'clock  7 cm from the nipple mixed echogenicity circumscribed mass measuring  0.6 x 0.4 x 0.5 cm. This finding corresponds to the mammographically  seen mass. Additionally, there is a second mixed echogenicity  indeterminate mass at 4:30 o'clock 7 cm from the nipple measuring  0.7 x 0.3 x 0.7 cm. There is no evidence of left axillary  lymphadenopathy.   IMPRESSION:  Two indeterminate left breast masses, at 4 o'clock and 4:30 o'clock,  for which ultrasound-guided core needle biopsy is recommended.  RECOMMENDATION:  Two site ultrasound-guided core needle biopsy of the  left breast.   I have discussed the findings and recommendations with the patient.  If applicable, a reminder letter will be sent to the patient  regarding the next appointment.   BI-RADS CATEGORY  4: Suspicious.    Electronically Signed    By: Ted Mcalpine M.D.    On: 03/30/2022 15:12   SURGICAL PATHOLOGY SURGICAL PATHOLOGY CASE: [id number] PATIENT: Theresa Hall Surgical Pathology Report     Specimen Submitted: A. Breast, left, 4:00 B. Breast, left, 4:30  Clinical History: Indeterminate masses left lower outer breast. Partially cystic in appearance.  Site 1:  papilloma, FCC, LN, malignancy.  Site 2:  FCC, Fa, papilloma, malignancy. A - Coil-shaped clip placed following ultrasound guided biopsy of LEFT breast at 4 o'clock, 7 cm fn. B - Venus shaped clip placed following ultrasound guided biopsy of LEFT breast at 4:30 o'clock, 7 cm fn.    DIAGNOSIS: A. BREAST, LEFT AT 4:00, 7 CM FROM THE NIPPLE; ULTRASOUND-GUIDED CORE NEEDLE BIOPSY: - FRAGMENTS OF BENIGN INTRADUCTAL PAPILLOMA, WITH FOCAL APOCRINE METAPLASIA AND USUAL DUCTAL HYPERPLASIA. - NEGATIVE FOR ATYPICAL PROLIFERATIVE BREAST DISEASE.  B. BREAST, LEFT AT 430 O'CLOCK, 7 CM FROM NIPPLE; ULTRASOUND-GUIDED CORE NEEDLE BIOPSY: - FRAGMENTS OF SCLEROSED INTRADUCTAL PAPILLOMA WITH ASSOCIATED FLORID USUAL DUCTAL HYPERPLASIA. - NO EVIDENCE OF ATYPICAL PROLIFERATIVE BREAST DISEASE.  Comment: If clinically indicated, conservative excision may be warranted to exclude a potential unsampled atypical component.  Clinical correlation is recommended.  GROSS DESCRIPTION: A. Labeled: Left breast 4:00 7 cm nipple Received: Formalin Time/date in fixative: Collected and placed in formalin at 8:45 AM on 04/08/2022 Cold ischemic time: Less than 1 minute Total fixation time: Approximately 8.5 hours Core pieces: 6 cores and 2 additional fragments Size: Range from 0.8-2.3 cm in length and 0.2 cm in diameter Description:  Received are cores and fragments of pink-yellow fibrofatty tissue.  The additional fragments are each 0.2 cm in greatest dimension. Ink color: Blue Entirely submitted in cassettes 1-3 with 3 cores in cassette 1, 2 cores in cassette 2, and 1 core with the remaining fragments in cassette 3.  B. Labeled: Left breast 4:30 7 cm from nipple Received: Formalin Time/date in fixative: Collection placed in formalin at 8:51 AM on 04/08/2022 Cold ischemic time: Less than 1 minute Total fixation time: Approximately 8.5 hours Core pieces: 3 cores and multiple additional fragments Size: Range from 1.2-1.4 cm in length and 0.2 cm in diameter Description: Received are cores and fragments of pink-yellow fibrofatty tissue.  The additional fragments are 1 x 0.4 x 0.2 cm in aggregate. Ink color: Green Entirely submitted in cassettes 1-2 with 3 cores in cassette 1 and the remaining fragments in cassette 2.  RB 04/08/2022   Final Diagnosis performed by Katherine Mantle, MD.   Electronically signed 04/11/2022 8:26:46AM The electronic signature indicates that the named Attending Pathologist has evaluated the specimen Technical component performed at [facility], [address], [place], [place] [address] Lab: [phone] Dir: Jolene Schimke, MD, MMM  Professional component performed at [facility], [facility], [address] 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 Discussed the risk of surgery including recurrence, chronic pain, post-op infxn, poor/delayed wound healing, poor cosmesis, seroma, hematoma formation, and possible re-operation to address said risks. The risks  of general anesthetic, if used, includes MI, CVA, sudden death or even reaction to anesthetic medications also discussed.  Typical post-op  recovery time and possbility of activity restrictions were also discussed.  Alternatives include continued observation.  Benefits include possible symptom relief, pathologic evaluation, and/or curative excision.   The patient verbalized understanding and all questions were answered to the patient's satisfaction.  2. Patient will like to consider all new information and discuss with family prior to determining next steps.  Can call office back with additional questions or concerns.  If she wishes to proceed with excision, no need to be seen back in office  UPDATE: Pt called back, will like to proceed with surgery. Will schedule. RF tag placement, NO SLNB  labs/images/medications/previous chart entries reviewed personally and relevant changes/updates noted above.

## 2022-04-19 NOTE — H&P (View-Only) (Signed)
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 female who was referred by Krichna F Sowles, MD for evaluation of above. Change was noted on last screening mammogram. Recently noted to have papilloma on breast biopsy. Here to discuss further management.   Past Medical History:  has a past medical history of Allergy to mold, Chickenpox, GERD (gastroesophageal reflux disease), Hyperlipidemia, Hypothyroidism, and Seasonal allergies.  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  Family History: family history includes Cirrhosis in her brother; Diabetes in her father and sister; Diabetes type I in her brother; Heart failure in her sister; Kidney failure in her sister; Multiple sclerosis in her mother; Myocardial Infarction (Heart attack) in her brother and father.  Social History:  reports that she has never smoked. She has never used smokeless tobacco. She reports that she does not drink alcohol and does not use drugs.  Current Medications: has a current medication list which includes the following prescription(s): acetaminophen, alendronate, ascorbic acid (vitamin c), biotin, calcium polycarbophil, cholecalciferol, famotidine, fluticasone propionate, ipratropium, irbesartan, levothyroxine, multivitamin, pravastatin, and timolol maleate.  Allergies:  Allergies as of 04/18/2022 - Reviewed 04/18/2022  Allergen Reaction Noted   Codeine Other (See Comments) 06/23/2014   Oxycodone Other (See Comments) 08/31/2015    ROS:  A 15 point review of systems was performed and was negative except as noted in HPI   Objective:     BP (!)  148/81   Pulse 74   Ht 162.6 cm (5' 4")   Wt 69.4 kg (153 lb)   BMI 26.26 kg/m   Constitutional :  No distress, cooperative, alert  Lymphatics/Throat:  Supple with no lymphadenopathy  Respiratory:  Clear to auscultation bilaterally  Cardiovascular:  Regular rate and rhythm  Gastrointestinal: Soft, non-tender, non-distended, no organomegaly.  Musculoskeletal: Steady gait and movement  Skin: Cool and moist  Psychiatric: Normal affect, non-agitated, not confused  Breast: deferred      LABS:  N/a   RADS: CLINICAL DATA:  Left breast lower outer quadrant possible asymmetry  seen on most recent screening mammography.   EXAM:  DIGITAL DIAGNOSTIC UNILATERAL LEFT MAMMOGRAM WITH TOMOSYNTHESIS;  ULTRASOUND LEFT BREAST LIMITED   TECHNIQUE:  Left digital diagnostic mammography and breast tomosynthesis was  performed.; Targeted ultrasound examination of the left breast was  performed.   COMPARISON: Previous exam(s).   ACR Breast Density Category b: There are scattered areas of  fibroglandular density.   FINDINGS:  Additional mammographic views of the left breast demonstrate  persistent circumscribed fat containing mass in the lower outer left  breast, anterior depth. The mammographic appearance is suggestive of  a benign intramammary lymph node.   Targeted left breast ultrasound is performed demonstrating 4 o'clock  7 cm from the nipple mixed echogenicity circumscribed mass measuring  0.6 x 0.4 x 0.5 cm. This finding corresponds to the mammographically  seen mass. Additionally, there is a second mixed echogenicity  indeterminate mass at 4:30 o'clock 7 cm from the nipple measuring  0.7 x 0.3 x 0.7 cm. There is no evidence of left axillary  lymphadenopathy.   IMPRESSION:  Two indeterminate left breast masses, at 4 o'clock and 4:30 o'clock,  for which ultrasound-guided core needle biopsy is recommended.     RECOMMENDATION:  Two site ultrasound-guided core needle biopsy of the  left breast.   I have discussed the findings and recommendations with the patient.  If applicable, a reminder letter will be sent to the patient  regarding the next appointment.   BI-RADS CATEGORY  4: Suspicious.    Electronically Signed    By: Dobrinka  Dimitrova M.D.    On: 03/30/2022 15:12   SURGICAL PATHOLOGY SURGICAL PATHOLOGY CASE: [id number] PATIENT: Theresa Hall Surgical Pathology Report     Specimen Submitted: A. Breast, left, 4:00 B. Breast, left, 4:30  Clinical History: Indeterminate masses left lower outer breast. Partially cystic in appearance.  Site 1:  papilloma, FCC, LN, malignancy.  Site 2:  FCC, Fa, papilloma, malignancy. A - Coil-shaped clip placed following ultrasound guided biopsy of LEFT breast at 4 o'clock, 7 cm fn. B - Venus shaped clip placed following ultrasound guided biopsy of LEFT breast at 4:30 o'clock, 7 cm fn.    DIAGNOSIS: A. BREAST, LEFT AT 4:00, 7 CM FROM THE NIPPLE; ULTRASOUND-GUIDED CORE NEEDLE BIOPSY: - FRAGMENTS OF BENIGN INTRADUCTAL PAPILLOMA, WITH FOCAL APOCRINE METAPLASIA AND USUAL DUCTAL HYPERPLASIA. - NEGATIVE FOR ATYPICAL PROLIFERATIVE BREAST DISEASE.  B. BREAST, LEFT AT 430 O'CLOCK, 7 CM FROM NIPPLE; ULTRASOUND-GUIDED CORE NEEDLE BIOPSY: - FRAGMENTS OF SCLEROSED INTRADUCTAL PAPILLOMA WITH ASSOCIATED FLORID USUAL DUCTAL HYPERPLASIA. - NO EVIDENCE OF ATYPICAL PROLIFERATIVE BREAST DISEASE.  Comment: If clinically indicated, conservative excision may be warranted to exclude a potential unsampled atypical component.  Clinical correlation is recommended.  GROSS DESCRIPTION: A. Labeled: Left breast 4:00 7 cm nipple Received: Formalin Time/date in fixative: Collected and placed in formalin at 8:45 AM on 04/08/2022 Cold ischemic time: Less than 1 minute Total fixation time: Approximately 8.5 hours Core pieces: 6 cores and 2 additional fragments Size: Range from 0.8-2.3 cm in length and 0.2 cm in diameter Description:  Received are cores and fragments of pink-yellow fibrofatty tissue.  The additional fragments are each 0.2 cm in greatest dimension. Ink color: Blue Entirely submitted in cassettes 1-3 with 3 cores in cassette 1, 2 cores in cassette 2, and 1 core with the remaining fragments in cassette 3.  B. Labeled: Left breast 4:30 7 cm from nipple Received: Formalin Time/date in fixative: Collection placed in formalin at 8:51 AM on 04/08/2022 Cold ischemic time: Less than 1 minute Total fixation time: Approximately 8.5 hours Core pieces: 3 cores and multiple additional fragments Size: Range from 1.2-1.4 cm in length and 0.2 cm in diameter Description: Received are cores and fragments of pink-yellow fibrofatty tissue.  The additional fragments are 1 x 0.4 x 0.2 cm in aggregate. Ink color: Green Entirely submitted in cassettes 1-2 with 3 cores in cassette 1 and the remaining fragments in cassette 2.  RB 04/08/2022   Final Diagnosis performed by Heath Jones, MD.   Electronically signed 04/11/2022 8:26:46AM The electronic signature indicates that the named Attending Pathologist has evaluated the specimen Technical component performed at [facility], [address], [place], [place] [address] Lab: [phone] Dir: Sanjai Nagendra, MD, MMM  Professional component performed at [facility], [facility], [address], [place], [place] [address] Lab: [phone] Dir: Heath M. 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 Discussed the risk of surgery including recurrence, chronic pain, post-op infxn, poor/delayed wound healing, poor cosmesis, seroma, hematoma formation, and possible re-operation to address said risks. The risks   of general anesthetic, if used, includes MI, CVA, sudden death or even reaction to anesthetic medications also discussed.  Typical post-op  recovery time and possbility of activity restrictions were also discussed.  Alternatives include continued observation.  Benefits include possible symptom relief, pathologic evaluation, and/or curative excision.   The patient verbalized understanding and all questions were answered to the patient's satisfaction.  2. Patient will like to consider all new information and discuss with family prior to determining next steps.  Can call office back with additional questions or concerns.  If she wishes to proceed with excision, no need to be seen back in office  UPDATE: Pt called back, will like to proceed with surgery. Will schedule. RF tag placement, NO SLNB  labs/images/medications/previous chart entries reviewed personally and relevant changes/updates noted above.  

## 2022-04-20 ENCOUNTER — Other Ambulatory Visit: Payer: Self-pay | Admitting: Surgery

## 2022-04-20 DIAGNOSIS — D242 Benign neoplasm of left breast: Secondary | ICD-10-CM

## 2022-04-22 ENCOUNTER — Other Ambulatory Visit: Payer: Self-pay | Admitting: Family Medicine

## 2022-04-22 DIAGNOSIS — E039 Hypothyroidism, unspecified: Secondary | ICD-10-CM

## 2022-04-25 ENCOUNTER — Other Ambulatory Visit: Payer: Self-pay

## 2022-04-25 DIAGNOSIS — E039 Hypothyroidism, unspecified: Secondary | ICD-10-CM

## 2022-04-25 MED ORDER — SYNTHROID 75 MCG PO TABS: 75.0000 ug | ORAL_TABLET | Freq: Every day | ORAL | 0 refills | Status: DC

## 2022-04-27 ENCOUNTER — Ambulatory Visit
Admission: RE | Admit: 2022-04-27 | Discharge: 2022-04-27 | Disposition: A | Discharge status: Home / Self Care | Payer: Medicare HMO | Source: Ambulatory Visit | Attending: Surgery | Admitting: Surgery

## 2022-04-27 ENCOUNTER — Inpatient Hospital Stay: Admission: RE | Admit: 2022-04-27 | Payer: Medicare HMO | Source: Ambulatory Visit

## 2022-04-27 ENCOUNTER — Other Ambulatory Visit: Payer: Self-pay | Admitting: Surgery

## 2022-04-27 DIAGNOSIS — D242 Benign neoplasm of left breast: Secondary | ICD-10-CM

## 2022-04-27 DIAGNOSIS — R928 Other abnormal and inconclusive findings on diagnostic imaging of breast: Secondary | ICD-10-CM | POA: Diagnosis not present

## 2022-04-27 HISTORY — PX: BREAST BIOPSY: SHX20

## 2022-04-27 MED ORDER — LIDOCAINE HCL (PF) 1 % IJ SOLN
5.0000 mL | Freq: Once | INTRAMUSCULAR | Status: AC
  Administered 2022-04-27: 5 mL

## 2022-04-27 MED ORDER — LIDOCAINE HCL (PF) 1 % IJ SOLN
10.0000 mL | Freq: Once | INTRAMUSCULAR | Status: AC
  Administered 2022-04-27: 10 mL

## 2022-05-02 ENCOUNTER — Encounter
Admission: RE | Admit: 2022-05-02 | Discharge: 2022-05-02 | Disposition: A | Discharge status: Home / Self Care | Payer: Medicare HMO | Source: Ambulatory Visit | Attending: Surgery | Admitting: Surgery

## 2022-05-02 ENCOUNTER — Other Ambulatory Visit: Payer: Self-pay

## 2022-05-02 VITALS — Ht 64.0 in | Wt 153.0 lb

## 2022-05-02 DIAGNOSIS — I10 Essential (primary) hypertension: Secondary | ICD-10-CM

## 2022-05-02 DIAGNOSIS — N183 Chronic kidney disease, stage 3 unspecified: Secondary | ICD-10-CM

## 2022-05-02 DIAGNOSIS — E785 Hyperlipidemia, unspecified: Secondary | ICD-10-CM

## 2022-05-02 NOTE — Patient Instructions (Addendum)
Your procedure is scheduled on: Friday 05/06/22 To find out your arrival time, please call [phone] between 1PM - 3PM on:   Thursday 05/05/22 Report to the Registration Desk on the 1st floor of the [facility]. Valet parking is available.  If your arrival time is 6:00 am, do not arrive before that time as the [facility] entrance doors do not open until 6:00 am.  REMEMBER: Instructions that are not followed completely may result in serious medical risk, up to and including death; or upon the discretion of your surgeon and anesthesiologist your surgery may need to be rescheduled.  Do not eat food after midnight the night before surgery.  No gum chewing or hard candies.  You may however, drink CLEAR liquids up to 2 hours before you are scheduled to arrive for your surgery. Do not drink anything within 2 hours of your scheduled arrival time.  Clear liquids include: - water  - apple juice without pulp - gatorade (not RED colors) - black coffee or tea (Do NOT add milk or creamers to the coffee or tea) Do NOT drink anything that is not on this list.  Type 1 and Type 2 diabetics should only drink water.  One week prior to surgery: Stop Anti-inflammatories (NSAIDS) such as Advil, Aleve, Ibuprofen, Motrin, Naproxen, Naprosyn and Aspirin based products such as Excedrin, Goody's Powder, BC Powder. You may however, continue to take Tylenol if needed for pain up until the day of surgery.  Stop ANY OVER THE COUNTER supplements until after surgery.  Continue taking all prescribed medications with the exception of the following:  **Follow guidelines for insulin and diabetes medications**  Follow recommendations from Cardiologist or PCP regarding stopping blood thinners.  TAKE ONLY THESE MEDICATIONS THE MORNING OF SURGERY WITH A SIP OF WATER:  famotidine (PEPCID) 40 MG tablet Antacid (take one the night before and one on the morning of surgery - helps to prevent nausea after  surgery.) SYNTHROID 75 MCG tablet  pravastatin (PRAVACHOL) 80 MG tablet  You can use your nasal spray  No Alcohol for 24 hours before or after surgery.  No Smoking including e-cigarettes for 24 hours before surgery.  No chewable tobacco products for at least 6 hours before surgery.  No nicotine patches on the day of surgery.  Do not use any "recreational" drugs for at least a week (preferably 2 weeks) before your surgery.  Please be advised that the combination of cocaine and anesthesia may have negative outcomes, up to and including death. If you test positive for cocaine, your surgery will be cancelled.  On the morning of surgery brush your teeth with toothpaste and water, you may rinse your mouth with mouthwash if you wish. Do not swallow any toothpaste or mouthwash.  Use CHG Soap or wipes as directed on instruction sheet.  Do not wear lotions, powders, or perfumes. No deodorant.  Do not shave body hair from the neck down 48 hours before surgery.  Wear comfortable clothing (specific to your surgery type) to the hospital.  Do not wear jewelry, make-up, hairpins, clips or nail polish.  Contact lenses, hearing aids and dentures may not be worn into surgery.  Do not bring valuables to the hospital. [facility] is not responsible for any missing/lost belongings or valuables.   Notify your doctor if there is any change in your medical condition (cold, fever, infection).  If you are being discharged the day of surgery, you will not be allowed to drive home. You will need  a responsible individual to drive you home and stay with you for 24 hours after surgery.   If you are taking public transportation, you will need to have a responsible individual with you.  If you are being admitted to the hospital overnight, leave your suitcase in the car. After surgery it may be brought to your room.  In case of increased patient census, it may be necessary for you, the patient, to continue  your postoperative care in the Same Day Surgery department.  After surgery, you can help prevent lung complications by doing breathing exercises.  Take deep breaths and cough every 1-2 hours. Your doctor may order a device called an Incentive Spirometer to help you take deep breaths. When coughing or sneezing, hold a pillow firmly against your incision with both hands. This is called "splinting." Doing this helps protect your incision. It also decreases belly discomfort.  Surgery Visitation Policy:  Patients undergoing a surgery or procedure may have two family members or support persons with them as long as the person is not COVID-19 positive or experiencing its symptoms.   Inpatient Visitation:    Visiting hours are 7 a.m. to 8 p.m. Up to four visitors are allowed at one time in a patient room. The visitors may rotate out with other people during the day. One designated support person (adult) may remain overnight.  Please call the Pre-admissions Testing Dept. at [phone] if you have any questions about these instructions.     Preparing for Surgery with CHLORHEXIDINE GLUCONATE (CHG) Soap  Chlorhexidine Gluconate (CHG) Soap  o An antiseptic cleaner that kills germs and bonds with the skin to continue killing germs even after washing  o Used for showering the night before surgery and morning of surgery  Before surgery, you can play an important role by reducing the number of germs on your skin.  CHG (Chlorhexidine gluconate) soap is an antiseptic cleanser which kills germs and bonds with the skin to continue killing germs even after washing.  Please do not use if you have an allergy to CHG or antibacterial soaps. If your skin becomes reddened/irritated stop using the CHG.  1. Shower the NIGHT BEFORE SURGERY and the MORNING OF SURGERY with CHG soap.  2. If you choose to wash your hair, wash your hair first as usual with your normal shampoo.  3. After shampooing, rinse your  hair and body thoroughly to remove the shampoo.  4. Use CHG as you would any other liquid soap. You can apply CHG directly to the skin and wash gently with a scrungie or a clean washcloth.  5. Apply the CHG soap to your body only from the neck down. Do not use on open wounds or open sores. Avoid contact with your eyes, ears, mouth, and genitals (private parts). Wash face and genitals (private parts) with your normal soap.  6. Wash thoroughly, paying special attention to the area where your surgery will be performed.  7. Thoroughly rinse your body with warm water.  8. Do not shower/wash with your normal soap after using and rinsing off the CHG soap.  9. Pat yourself dry with a clean towel.  10. Wear clean pajamas to bed the night before surgery.  12. Place clean sheets on your bed the night of your first shower and do not sleep with pets.  13. Shower again with the CHG soap on the day of surgery prior to arriving at the hospital.  14. Do not apply any deodorants/lotions/powders.  15.  Please wear clean clothes to the hospital.

## 2022-05-04 ENCOUNTER — Encounter
Admission: RE | Admit: 2022-05-04 | Discharge: 2022-05-04 | Disposition: A | Discharge status: Home / Self Care | Payer: Medicare HMO | Source: Ambulatory Visit | Attending: Surgery | Admitting: Surgery

## 2022-05-04 DIAGNOSIS — Z01810 Encounter for preprocedural cardiovascular examination: Secondary | ICD-10-CM | POA: Insufficient documentation

## 2022-05-04 DIAGNOSIS — N183 Chronic kidney disease, stage 3 unspecified: Secondary | ICD-10-CM | POA: Diagnosis not present

## 2022-05-04 DIAGNOSIS — I129 Hypertensive chronic kidney disease with stage 1 through stage 4 chronic kidney disease, or unspecified chronic kidney disease: Secondary | ICD-10-CM | POA: Insufficient documentation

## 2022-05-04 DIAGNOSIS — I10 Essential (primary) hypertension: Secondary | ICD-10-CM

## 2022-05-04 DIAGNOSIS — E785 Hyperlipidemia, unspecified: Secondary | ICD-10-CM | POA: Diagnosis not present

## 2022-05-05 MED ORDER — CHLORHEXIDINE GLUCONATE CLOTH 2 % EX PADS
6.0000 | MEDICATED_PAD | Freq: Once | CUTANEOUS | Status: AC
  Administered 2022-05-06: 6 via TOPICAL

## 2022-05-05 MED ORDER — CHLORHEXIDINE GLUCONATE 0.12 % MT SOLN
15.0000 mL | Freq: Once | OROMUCOSAL | Status: AC
  Administered 2022-05-06: 15 mL via OROMUCOSAL

## 2022-05-05 MED ORDER — LACTATED RINGERS IV SOLN: INTRAVENOUS | Status: DC

## 2022-05-05 MED ORDER — CEFAZOLIN SODIUM-DEXTROSE 2-4 GM/100ML-% IV SOLN
2.0000 g | INTRAVENOUS | Status: AC
  Administered 2022-05-06: 2 g via INTRAVENOUS

## 2022-05-05 MED ORDER — ORAL CARE MOUTH RINSE: 15.0000 mL | Freq: Once | OROMUCOSAL | Status: AC

## 2022-05-06 ENCOUNTER — Encounter: Payer: Self-pay | Admitting: Surgery

## 2022-05-06 ENCOUNTER — Encounter
Admission: RE | Disposition: A | Discharge status: Home / Self Care | Payer: Self-pay | Source: Home / Self Care | Attending: Surgery

## 2022-05-06 ENCOUNTER — Ambulatory Visit
Admission: RE | Admit: 2022-05-06 | Discharge: 2022-05-06 | Disposition: A | Discharge status: Home / Self Care | Payer: Medicare HMO | Attending: Surgery | Admitting: Surgery

## 2022-05-06 ENCOUNTER — Ambulatory Visit: Payer: Medicare HMO | Admitting: Certified Registered"

## 2022-05-06 ENCOUNTER — Ambulatory Visit
Admission: RE | Admit: 2022-05-06 | Discharge: 2022-05-06 | Disposition: A | Discharge status: Home / Self Care | Payer: Medicare HMO | Source: Ambulatory Visit | Attending: Surgery | Admitting: Surgery

## 2022-05-06 ENCOUNTER — Ambulatory Visit: Payer: Medicare HMO | Admitting: Urgent Care

## 2022-05-06 ENCOUNTER — Other Ambulatory Visit: Payer: Self-pay

## 2022-05-06 DIAGNOSIS — N183 Chronic kidney disease, stage 3 unspecified: Secondary | ICD-10-CM | POA: Diagnosis not present

## 2022-05-06 DIAGNOSIS — D242 Benign neoplasm of left breast: Secondary | ICD-10-CM

## 2022-05-06 DIAGNOSIS — I129 Hypertensive chronic kidney disease with stage 1 through stage 4 chronic kidney disease, or unspecified chronic kidney disease: Secondary | ICD-10-CM | POA: Insufficient documentation

## 2022-05-06 DIAGNOSIS — Z85820 Personal history of malignant melanoma of skin: Secondary | ICD-10-CM | POA: Diagnosis not present

## 2022-05-06 DIAGNOSIS — J302 Other seasonal allergic rhinitis: Secondary | ICD-10-CM

## 2022-05-06 DIAGNOSIS — E785 Hyperlipidemia, unspecified: Secondary | ICD-10-CM | POA: Insufficient documentation

## 2022-05-06 DIAGNOSIS — E039 Hypothyroidism, unspecified: Secondary | ICD-10-CM | POA: Diagnosis not present

## 2022-05-06 DIAGNOSIS — R928 Other abnormal and inconclusive findings on diagnostic imaging of breast: Secondary | ICD-10-CM | POA: Diagnosis not present

## 2022-05-06 DIAGNOSIS — Z8249 Family history of ischemic heart disease and other diseases of the circulatory system: Secondary | ICD-10-CM | POA: Diagnosis not present

## 2022-05-06 DIAGNOSIS — K219 Gastro-esophageal reflux disease without esophagitis: Secondary | ICD-10-CM | POA: Insufficient documentation

## 2022-05-06 DIAGNOSIS — C50912 Malignant neoplasm of unspecified site of left female breast: Secondary | ICD-10-CM | POA: Diagnosis not present

## 2022-05-06 SURGERY — PARTIAL MASTECTOMY WITH RADIO FREQUENCY LOCALIZER
Anesthesia: General | Site: Breast | Laterality: Left

## 2022-05-06 MED ORDER — LIDOCAINE HCL (CARDIAC) PF 100 MG/5ML IV SOSY
PREFILLED_SYRINGE | INTRAVENOUS | Status: DC | PRN
  Administered 2022-05-06: 60 mg via INTRAVENOUS

## 2022-05-06 MED ORDER — SCOPOLAMINE 1 MG/3DAYS TD PT72
MEDICATED_PATCH | TRANSDERMAL | Status: DC | PRN
  Administered 2022-05-06: 1 via TRANSDERMAL

## 2022-05-06 MED ORDER — FENTANYL CITRATE (PF) 100 MCG/2ML IJ SOLN: 25.0000 ug | INTRAMUSCULAR | Status: DC | PRN

## 2022-05-06 MED ORDER — ACETAMINOPHEN 10 MG/ML IV SOLN
INTRAVENOUS | Status: AC
  Filled 2022-05-06: qty 100

## 2022-05-06 MED ORDER — EPHEDRINE SULFATE (PRESSORS) 50 MG/ML IJ SOLN
INTRAMUSCULAR | Status: DC | PRN
  Administered 2022-05-06 (×2): 5 mg via INTRAVENOUS
  Administered 2022-05-06: 2.5 mg via INTRAVENOUS

## 2022-05-06 MED ORDER — DOCUSATE SODIUM 100 MG PO CAPS: 100.0000 mg | ORAL_CAPSULE | Freq: Two times a day (BID) | ORAL | 0 refills | Status: AC | PRN

## 2022-05-06 MED ORDER — FLUTICASONE PROPIONATE 50 MCG/ACT NA SUSP
1.0000 | Freq: Every evening | NASAL | Status: AC | PRN
Start: 2022-05-06 — End: ?

## 2022-05-06 MED ORDER — ONDANSETRON HCL 4 MG/2ML IJ SOLN
INTRAMUSCULAR | Status: DC | PRN
  Administered 2022-05-06: 4 mg via INTRAVENOUS

## 2022-05-06 MED ORDER — FENTANYL CITRATE (PF) 100 MCG/2ML IJ SOLN
INTRAMUSCULAR | Status: DC | PRN
  Administered 2022-05-06 (×4): 25 ug via INTRAVENOUS

## 2022-05-06 MED ORDER — SCOPOLAMINE 1 MG/3DAYS TD PT72
MEDICATED_PATCH | TRANSDERMAL | Status: AC
  Filled 2022-05-06: qty 1

## 2022-05-06 MED ORDER — CEFAZOLIN SODIUM-DEXTROSE 2-4 GM/100ML-% IV SOLN
INTRAVENOUS | Status: AC
  Filled 2022-05-06: qty 100

## 2022-05-06 MED ORDER — ONDANSETRON HCL 4 MG/2ML IJ SOLN: 4.0000 mg | Freq: Once | INTRAMUSCULAR | Status: DC | PRN

## 2022-05-06 MED ORDER — LIDOCAINE HCL (PF) 1 % IJ SOLN
INTRAMUSCULAR | Status: AC
  Filled 2022-05-06: qty 30

## 2022-05-06 MED ORDER — STERILE WATER FOR IRRIGATION IR SOLN
Status: DC | PRN
  Administered 2022-05-06: 200 mL

## 2022-05-06 MED ORDER — CHLORHEXIDINE GLUCONATE 0.12 % MT SOLN
OROMUCOSAL | Status: AC
  Filled 2022-05-06: qty 15

## 2022-05-06 MED ORDER — TRAMADOL HCL 50 MG PO TABS: 50.0000 mg | ORAL_TABLET | Freq: Four times a day (QID) | ORAL | 0 refills | Status: DC | PRN

## 2022-05-06 MED ORDER — BUPIVACAINE HCL (PF) 0.5 % IJ SOLN
INTRAMUSCULAR | Status: AC
  Filled 2022-05-06: qty 30

## 2022-05-06 MED ORDER — LIDOCAINE HCL 1 % IJ SOLN
INTRAMUSCULAR | Status: DC | PRN
  Administered 2022-05-06: 15 mL

## 2022-05-06 MED ORDER — ACETAMINOPHEN 10 MG/ML IV SOLN
INTRAVENOUS | Status: DC | PRN
  Administered 2022-05-06: 1000 mg via INTRAVENOUS

## 2022-05-06 MED ORDER — DEXAMETHASONE SODIUM PHOSPHATE 10 MG/ML IJ SOLN
INTRAMUSCULAR | Status: DC | PRN
  Administered 2022-05-06: 10 mg via INTRAVENOUS

## 2022-05-06 MED ORDER — FENTANYL CITRATE (PF) 100 MCG/2ML IJ SOLN
INTRAMUSCULAR | Status: AC
  Filled 2022-05-06: qty 2

## 2022-05-06 MED ORDER — ACETAMINOPHEN 10 MG/ML IV SOLN: 1000.0000 mg | Freq: Once | INTRAVENOUS | Status: DC | PRN

## 2022-05-06 MED ORDER — IPRATROPIUM BROMIDE 0.06 % NA SOLN
2.0000 | Freq: Four times a day (QID) | NASAL | Status: DC | PRN
Start: 2022-05-06 — End: 2022-07-13

## 2022-05-06 MED ORDER — TRAMADOL HCL 50 MG PO TABS
ORAL_TABLET | ORAL | Status: AC
  Filled 2022-05-06: qty 1

## 2022-05-06 MED ORDER — EPINEPHRINE PF 1 MG/ML IJ SOLN
INTRAMUSCULAR | Status: AC
  Filled 2022-05-06: qty 1

## 2022-05-06 MED ORDER — PROPOFOL 10 MG/ML IV BOLUS
INTRAVENOUS | Status: DC | PRN
  Administered 2022-05-06: 30 mg via INTRAVENOUS
  Administered 2022-05-06: 150 mg via INTRAVENOUS
  Administered 2022-05-06: 30 mg via INTRAVENOUS

## 2022-05-06 MED ORDER — TRAMADOL HCL 50 MG PO TABS
50.0000 mg | ORAL_TABLET | Freq: Once | ORAL | Status: AC
  Administered 2022-05-06: 50 mg via ORAL

## 2022-05-06 SURGICAL SUPPLY — 35 items
ADH SKN CLS APL DERMABOND .7 (GAUZE/BANDAGES/DRESSINGS) ×1
APL PRP STRL LF DISP 70% ISPRP (MISCELLANEOUS) ×2
BLADE PHOTON ILLUMINATED (MISCELLANEOUS) ×2 IMPLANT
BLADE SURG 15 STRL LF DISP TIS (BLADE) ×2 IMPLANT
BLADE SURG 15 STRL SS (BLADE) ×1
CHLORAPREP W/TINT 26 (MISCELLANEOUS) ×2 IMPLANT
DERMABOND ADVANCED .7 DNX12 (GAUZE/BANDAGES/DRESSINGS) ×2 IMPLANT
DEVICE DUBIN SPECIMEN MAMMOGRA (MISCELLANEOUS) ×2 IMPLANT
DRAPE LAPAROTOMY TRNSV 106X77 (MISCELLANEOUS) ×2 IMPLANT
ELECT REM PT RETURN 9FT ADLT (ELECTROSURGICAL) ×1
ELECTRODE REM PT RTRN 9FT ADLT (ELECTROSURGICAL) ×2 IMPLANT
GAUZE 4X4 16PLY ~~LOC~~+RFID DBL (SPONGE) ×2 IMPLANT
GLOVE BIOGEL PI IND STRL 7.0 (GLOVE) ×2 IMPLANT
GLOVE SURG SYN 6.5 ES PF (GLOVE) ×1 IMPLANT
GLOVE SURG SYN 6.5 PF PI (GLOVE) ×2 IMPLANT
GOWN STRL REUS W/ TWL LRG LVL3 (GOWN DISPOSABLE) ×6 IMPLANT
GOWN STRL REUS W/TWL LRG LVL3 (GOWN DISPOSABLE) ×6
KIT MARKER MARGIN INK (KITS) IMPLANT
KIT TURNOVER KIT A (KITS) ×2 IMPLANT
LABEL OR SOLS (LABEL) ×2 IMPLANT
MANIFOLD NEPTUNE II (INSTRUMENTS) ×2 IMPLANT
MARKER MARGIN CORRECT CLIP (MARKER) ×2 IMPLANT
NDL HYPO 22X1.5 SAFETY MO (MISCELLANEOUS) ×4 IMPLANT
NEEDLE HYPO 22X1.5 SAFETY MO (MISCELLANEOUS) ×2 IMPLANT
PACK BASIN MINOR ARMC (MISCELLANEOUS) ×2 IMPLANT
SET LOCALIZER 20 PROBE US (MISCELLANEOUS) ×2 IMPLANT
SUT MNCRL 4-0 (SUTURE) ×1
SUT MNCRL 4-0 27XMFL (SUTURE) ×1
SUT VIC AB 3-0 SH 27 (SUTURE) ×1
SUT VIC AB 3-0 SH 27X BRD (SUTURE) ×4 IMPLANT
SUTURE MNCRL 4-0 27XMF (SUTURE) ×4 IMPLANT
SYR 20ML LL LF (SYRINGE) ×2 IMPLANT
TRAP NEPTUNE SPECIMEN COLLECT (MISCELLANEOUS) ×2 IMPLANT
TUBING CONNECTING 10 (TUBING) ×2 IMPLANT
WATER STERILE IRR 500ML POUR (IV SOLUTION) ×2 IMPLANT

## 2022-05-06 NOTE — Discharge Instructions (Addendum)
Removal, Care After This sheet gives you information about how to care for yourself after your procedure. Your health care provider may also give you more specific instructions. If you have problems or questions, contact your health care provider. What can I expect after the procedure? After the procedure, it is common to have: Soreness. Bruising. Itching. Follow these instructions at home: site care Follow instructions from your health care provider about how to take care of your site. Make sure you: Wash your hands with soap and water before and after you change your bandage (dressing). If soap and water are not available, use hand sanitizer. Leave stitches (sutures), skin glue, or adhesive strips in place. These skin closures may need to stay in place for 2 weeks or longer. If adhesive strip edges start to loosen and curl up, you may trim the loose edges. Do not remove adhesive strips completely unless your health care provider tells you to do that. If the area bleeds or bruises, apply gentle pressure for 10 minutes. OK TO SHOWER IN 24HRS  Check your site every day for signs of infection. Check for: Redness, swelling, or pain. Fluid or blood. Warmth. Pus or a bad smell.  General instructions Rest and then return to your normal activities as told by your health care provider.  tylenol as needed for discomfort.   Use narcotics, if prescribed, only when tylenol is not enough to control pain.  325-650mg  every 8hrs to max of 3000mg /24hrs  for the tylenol.   Keep all follow-up visits as told by your health care provider. This is important. Contact a health care provider if: You have redness, swelling, or pain around your site. You have fluid or blood coming from your site. Your site feels warm to the touch. You have pus or a bad smell coming from your site. You have a fever. Your sutures, skin glue, or adhesive strips loosen or come off sooner than expected. Get help right away if: You  have bleeding that does not stop with pressure or a dressing. Summary After the procedure, it is common to have some soreness, bruising, and itching at the site. Follow instructions from your health care provider about how to take care of your site. Check your site every day for signs of infection. Contact a health care provider if you have redness, swelling, or pain around your site, or your site feels warm to the touch. Keep all follow-up visits as told by your health care provider. This is important. This information is not intended to replace advice given to you by your health care provider. Make sure you discuss any questions you have with your health care provider. Document Released: 01/23/2015 Document Revised: 06/26/2017 Document Reviewed: 06/26/2017 Elsevier Interactive Patient Education  2019 [employer]  AMBULATORY SURGERY  DISCHARGE INSTRUCTIONS   The drugs that you were given will stay in your system until tomorrow so for the next 24 hours you should not:  Drive an automobile Make any legal decisions Drink any alcoholic beverage   You may resume regular meals tomorrow.  Today it is better to start with liquids and gradually work up to solid foods.  You may eat anything you prefer, but it is better to start with liquids, then soup and crackers, and gradually work up to solid foods.   Please notify your doctor immediately if you have any unusual bleeding, trouble breathing, redness and pain at the surgery site, drainage, fever, or pain not relieved by medication.    Your  post-operative visit with Dr.                                       is: Date:                        Time:    Please call to schedule your post-operative visit.  Additional Instructions:

## 2022-05-06 NOTE — Transfer of Care (Signed)
Immediate Anesthesia Transfer of Care Note  Patient: Theresa Hall  Procedure(s) Performed: PARTIAL MASTECTOMY WITH RADIO FREQUENCY LOCALIZER (Left: Breast)  Patient Location: PACU  Anesthesia Type:General  Level of Consciousness: awake, alert , and oriented  Airway & Oxygen Therapy: Patient Spontanous Breathing and Patient connected to face mask oxygen  Post-op Assessment: Report given to RN and Post -op Vital signs reviewed and stable  Post vital signs: stable  Last Vitals:  Vitals Value Taken Time  BP 121/57 05/06/22 1302  Temp    Pulse 63 05/06/22 1304  Resp 15 05/06/22 1304  SpO2 100 % 05/06/22 1304  Vitals shown include unvalidated device data.  Last Pain:  Vitals:   05/06/22 1043  PainSc: 0-No pain         Complications: No notable events documented.

## 2022-05-06 NOTE — Anesthesia Postprocedure Evaluation (Signed)
Anesthesia Post Note  Patient: Theresa Hall  Procedure(s) Performed: PARTIAL MASTECTOMY WITH RADIO FREQUENCY LOCALIZER (Left: Breast)  Patient location during evaluation: PACU Anesthesia Type: General Level of consciousness: awake and alert Pain management: pain level controlled Vital Signs Assessment: post-procedure vital signs reviewed and stable Respiratory status: spontaneous breathing, nonlabored ventilation, respiratory function stable and patient connected to nasal cannula oxygen Cardiovascular status: blood pressure returned to baseline and stable Postop Assessment: no apparent nausea or vomiting Anesthetic complications: no   There were no known notable events for this encounter.   Last Vitals:  Vitals:   05/06/22 1315 05/06/22 1330  BP: 134/60 (!) 148/68  Pulse: 60 (!) 58  Resp: 17 19  Temp:  (!) 36.1 C  SpO2: 98% 98%    Last Pain:  Vitals:   05/06/22 1330  PainSc: 2                  Corinda Gubler

## 2022-05-06 NOTE — Anesthesia Preprocedure Evaluation (Addendum)
Anesthesia Evaluation  Patient identified by MRN, date of birth, ID band Patient awake    Reviewed: Allergy & Precautions, H&P , NPO status , Patient's Chart, lab work & pertinent test results, reviewed documented beta blocker date and time   History of Anesthesia Complications (+) PONV and history of anesthetic complications  Airway Mallampati: II  TM Distance: >3 FB Neck ROM: full    Dental no notable dental hx. (+) Teeth Intact   Pulmonary neg pulmonary ROS, neg sleep apnea, neg COPD, Patient abstained from smoking.Not current smoker   Pulmonary exam normal breath sounds clear to auscultation       Cardiovascular Exercise Tolerance: Good METShypertension, (-) CAD and (-) Past MI (-) dysrhythmias  Rhythm:regular Rate:Normal - Systolic murmurs    Neuro/Psych  Headaches  negative psych ROS   GI/Hepatic Neg liver ROS,GERD  ,,  Endo/Other  neg diabetesHypothyroidism    Renal/GU Renal disease (stage 3)  negative genitourinary   Musculoskeletal   Abdominal   Peds  Hematology negative hematology ROS (+)   Anesthesia Other Findings Past Medical History: No date: Allergic rhinitis, cause unspecified No date: Allergy 10/2011: Cancer (HCC)     Comment:  basal 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 2006: Cancer (HCC)     Comment:  melanoma on shoulder  No date: Chronic kidney disease, stage I No date: Chronic kidney disease, stage III (moderate) (HCC) No date: Diverticulitis of colon (without mention of hemorrhage)(562. 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 No date: Pure hypercholesterolemia No date: Unspecified glaucoma(365.9) No date: Unspecified hypothyroidism No date: Vertigo     Comment:  none for over 2 yrs  Reproductive/Obstetrics negative OB ROS                             Anesthesia Physical Anesthesia Plan  ASA: 2  Anesthesia Plan: General   Post-op Pain Management: Ofirmev IV (intra-op)*   Induction: Intravenous  PONV Risk Score and Plan: 3 and Ondansetron, Dexamethasone and Scopolamine patch - Pre-op  Airway Management Planned: LMA  Additional Equipment: None  Intra-op Plan:   Post-operative Plan: Extubation in OR  Informed Consent: I have reviewed the patients History and Physical, chart, labs and discussed the procedure including the risks, benefits and alternatives for the proposed anesthesia with the patient or authorized representative who has indicated his/her understanding and acceptance.     Dental advisory given  Plan Discussed with: CRNA and Surgeon  Anesthesia Plan Comments: (Discussed risks of anesthesia with patient, including PONV, sore throat, lip/dental/eye damage. Rare risks discussed as well, such as cardiorespiratory and neurological sequelae, and allergic reactions. Discussed the role of CRNA in patient's perioperative care. Patient understands.)        Anesthesia Quick Evaluation

## 2022-05-06 NOTE — Interval H&P Note (Signed)
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  The various methods of treatment have been discussed with the patient and family. After consideration of risks, benefits and other options for treatment, the patient has consented to  Procedure(s): PARTIAL MASTECTOMY WITH RADIO FREQUENCY LOCALIZER (Left) as a surgical intervention.  The patient's history has been reviewed, patient examined, no change in status, stable for surgery.  I have reviewed the patient's chart and labs.  Questions were answered to the patient's satisfaction.     Jamelyn Bovard Tonna Boehringer

## 2022-05-06 NOTE — Anesthesia Procedure Notes (Signed)
Procedure Name: LMA Insertion Date/Time: 05/06/2022 11:47 AM  Performed by: Maryla Morrow., CRNAPre-anesthesia Checklist: Patient identified, Patient being monitored, Timeout performed, Emergency Drugs available and Suction available Patient Re-evaluated:Patient Re-evaluated prior to induction Oxygen Delivery Method: Circle system utilized Preoxygenation: Pre-oxygenation with 100% oxygen Induction Type: IV induction Ventilation: Mask ventilation without difficulty LMA: LMA inserted LMA Size: 4.0 Tube type: Oral Number of attempts: 1 Placement Confirmation: positive ETCO2 and breath sounds checked- equal and bilateral Tube secured with: Tape Dental Injury: Teeth and Oropharynx as per pre-operative assessment

## 2022-05-06 NOTE — Op Note (Signed)
Preoperative diagnosis: Left breast papilloma  Postoperative diagnosis: Same.   Procedure: RF tag-localized left breast lumpectomy                  Anesthesia: GETA  Surgeon: Dr. Sung Amabile  Wound Classification: Clean  Indications: See H&P Specimen: Left breast mass, left superior margin  Complications: None  Estimated Blood Loss: 10mL  Findings: 1. Specimen mammography shows marker and 1 of RF localizer on specimen.  Second RF tag noted to be within superior margin so this was excised as well 2. Pathology call refers gross examination of margins was normal 3. No other palpable mass or lymph node identified.    Description of procedure: RF localization was performed by radiology prior to procedure. In the nuclear medicine suite, the subareolar region was injected with Tc-99 sulfur colloid the morning of procedure. Localization studies were reviewed. The patient was taken to the operating room and placed supine on the operating table, and after general anesthesia the left breast and axilla were prepped and draped in the usual sterile fashion. A time-out was completed verifying correct patient, procedure, site, positioning, and implant(s) and/or special equipment prior to beginning this procedure.  By identifying the RF localizer, the probable trajectory and location of the mass was visualized. A skin incision was planned in such a way as to minimize the amount of dissection to reach the mass.  The skin incision was made after infusion of local. Flaps were raised and  Sharp and blunt dissection was then taken down to the mass, taking care to include the entire RF localizer and a margin of grossly normal tissue. The specimen was removed. The specimen was oriented with paint. Imaging reviewed and t 2 biopsy clips and 1 localizer within the specimen.reexamination of the surgical field with localizer noted second RF tag to be within superior margin.  This area was resected as well, painted,  sent to pathology.  Gross margin analysis by pathology confirmed all margins cleared on initial inspection.  Hemostasis was achieved and the wound closed in layers with  interrupted sutures of 3-0 Vicryl in deep dermal layer and a running subcuticular suture of Monocryl 4-0, then dressed with dermabond. The patient tolerated the procedure well and was taken to the postanesthesia care unit in stable condition. Sponge and instrument count correct at end of procedure.

## 2022-05-10 ENCOUNTER — Other Ambulatory Visit: Payer: Self-pay | Admitting: Anatomic Pathology & Clinical Pathology

## 2022-05-11 ENCOUNTER — Telehealth: Payer: Self-pay

## 2022-05-11 NOTE — Telephone Encounter (Signed)
Introductory call made to patient prior to her new patient appointment with Dr. Cathie Hoops on 05/12/22. I introduced myself to patient and asked if she knew where the Cancer Center is located. She stated that she did know where the Cancer Center is located. Patient also had a few questions about her appointment all of which where answered.

## 2022-05-12 ENCOUNTER — Inpatient Hospital Stay: Payer: Medicare HMO

## 2022-05-12 ENCOUNTER — Encounter: Payer: Self-pay | Admitting: Oncology

## 2022-05-12 ENCOUNTER — Encounter: Payer: Self-pay | Admitting: *Deleted

## 2022-05-12 ENCOUNTER — Inpatient Hospital Stay: Payer: Medicare HMO | Attending: Oncology | Admitting: Oncology

## 2022-05-12 VITALS — BP 139/74 | HR 68 | Temp 97.1°F | Resp 18 | Wt 156.7 lb

## 2022-05-12 DIAGNOSIS — N183 Chronic kidney disease, stage 3 unspecified: Secondary | ICD-10-CM | POA: Diagnosis not present

## 2022-05-12 DIAGNOSIS — Z79890 Hormone replacement therapy (postmenopausal): Secondary | ICD-10-CM | POA: Insufficient documentation

## 2022-05-12 DIAGNOSIS — K219 Gastro-esophageal reflux disease without esophagitis: Secondary | ICD-10-CM | POA: Insufficient documentation

## 2022-05-12 DIAGNOSIS — Z79899 Other long term (current) drug therapy: Secondary | ICD-10-CM | POA: Insufficient documentation

## 2022-05-12 DIAGNOSIS — M1990 Unspecified osteoarthritis, unspecified site: Secondary | ICD-10-CM | POA: Diagnosis not present

## 2022-05-12 DIAGNOSIS — Z170 Estrogen receptor positive status [ER+]: Secondary | ICD-10-CM | POA: Insufficient documentation

## 2022-05-12 DIAGNOSIS — E039 Hypothyroidism, unspecified: Secondary | ICD-10-CM | POA: Diagnosis not present

## 2022-05-12 DIAGNOSIS — C50919 Malignant neoplasm of unspecified site of unspecified female breast: Secondary | ICD-10-CM | POA: Diagnosis not present

## 2022-05-12 DIAGNOSIS — M8580 Other specified disorders of bone density and structure, unspecified site: Secondary | ICD-10-CM | POA: Insufficient documentation

## 2022-05-12 DIAGNOSIS — C50512 Malignant neoplasm of lower-outer quadrant of left female breast: Secondary | ICD-10-CM | POA: Insufficient documentation

## 2022-05-12 DIAGNOSIS — E7800 Pure hypercholesterolemia, unspecified: Secondary | ICD-10-CM | POA: Diagnosis not present

## 2022-05-12 DIAGNOSIS — I129 Hypertensive chronic kidney disease with stage 1 through stage 4 chronic kidney disease, or unspecified chronic kidney disease: Secondary | ICD-10-CM | POA: Diagnosis not present

## 2022-05-12 DIAGNOSIS — Z7189 Other specified counseling: Secondary | ICD-10-CM | POA: Diagnosis not present

## 2022-05-12 DIAGNOSIS — Z85828 Personal history of other malignant neoplasm of skin: Secondary | ICD-10-CM | POA: Insufficient documentation

## 2022-05-12 NOTE — Assessment & Plan Note (Addendum)
Pathology and radiology counseling: Discussed with the patient, the details of pathology including the type of breast cancer,the clinical staging, the significance of ER, PR and HER-2/neu receptors and the implications for treatment. After reviewing the pathology in detail, we proceeded to discuss the standard treatment options between radiation, chemotherapy, antiestrogen therapies.  pT1b pNx Grade 1, triple negative left breast cancer.  Have discussed with pathologist Dr. Vilma Prader. Add Ki-67 staining.   Treatment plan: 1.  Left axillary sentinel lymph node biopsy 2   T >0.5cm, Grade 1, given her age, other medical conditions, personal preference, if lymph node is negative, I will not offer adjvuant chemotherapy.  2.  Adjuvant radiation

## 2022-05-12 NOTE — Progress Notes (Signed)
Hematology/Oncology Consult Note [phone] [phone]     REFERRING PROVIDER: Sung Amabile, DO    CHIEF COMPLAINTS/PURPOSE OF CONSULTATION:  Left breast invasive carcinoma.   ASSESSMENT & PLAN:   Cancer Staging  Invasive carcinoma of breast (HCC) Staging form: Breast, AJCC 8th Edition - Pathologic stage from 05/06/2022: Stage Unknown (pT1b, pNX, G1, ER: Unknown, PR: Unknown, HER2: Unknown) - Signed by Rickard Patience, MD on 05/12/2022   Invasive carcinoma of breast [facility]) Pathology and radiology counseling: Discussed with the patient, the details of pathology including the type of breast cancer,the clinical staging, the significance of ER, PR and HER-2/neu receptors and the implications for treatment. After reviewing the pathology in detail, we proceeded to discuss the standard treatment options between radiation, chemotherapy, antiestrogen therapies.  pT1b pNx Grade 1, triple negative left breast cancer.  Have discussed with pathologist Dr. Vilma Prader. Add Ki-67 staining.   Treatment plan: 1.  Left axillary sentinel lymph node biopsy 2   T >0.5cm, Grade 1, given her age, other medical conditions, personal preference, if lymph node is negative, I will not offer adjvuant chemotherapy.  2.  Adjuvant radiation     Goals of care, counseling/discussion Discussed with patient.   No orders of the defined types were placed in this encounter.  Follow up TBD  All questions were answered. The patient knows to call the clinic with any problems, questions or concerns.  Rickard Patience, MD, PhD [facility] Hematology Oncology 05/12/2022    HISTORY OF PRESENTING ILLNESS:  Theresa Hall 82 y.o. female presents to establish care for left breast invasive carcinoma.   I have reviewed her chart and materials related to her cancer extensively and collaborated history with the patient. Summary of oncologic history is as follows: Oncology History  Invasive carcinoma of breast (HCC)   03/24/2022 Imaging   Bilateral screening mammogram  In the left breast, a possible focal asymmetry warrants further evaluation. In the right breast, no findings suspicious for malignancy.   03/30/2022 Imaging   Left diagnostic mammogram/US Two indeterminate left breast masses, at 4 o'clock and 4:30 o'clock,for which ultrasound-guided core needle biopsy is recommended. There is no evidence of left axillary lymphadenopathy.   04/08/2022 Procedure   Patient underwent left breast lesions 4:00, 4:30 biopsy.  A. BREAST, LEFT AT 4:00, 7 CM FROM THE NIPPLE; ULTRASOUND-GUIDED CORE NEEDLE BIOPSY: - FRAGMENTS OF BENIGN INTRADUCTAL PAPILLOMA, WITH FOCAL APOCRINE METAPLASIA AND USUAL DUCTAL HYPERPLASIA. - NEGATIVE FOR ATYPICAL PROLIFERATIVE BREAST DISEASE.  B. BREAST, LEFT AT 430 O'CLOCK, 7 CM FROM NIPPLE; ULTRASOUND-GUIDED CORE NEEDLE BIOPSY: - FRAGMENTS OF SCLEROSED INTRADUCTAL PAPILLOMA WITH ASSOCIATED FLORID USUAL DUCTAL HYPERPLASIA. - NO EVIDENCE OF ATYPICAL PROLIFERATIVE BREAST DISEASE     05/06/2022 Cancer Staging   Staging form: Breast, AJCC 8th Edition - Pathologic stage from 05/06/2022: Stage Unknown (pT1b, pNX, G1, ER: Unknown, PR: Unknown, HER2: Unknown) - Signed by Rickard Patience, MD on 05/12/2022 Stage prefix: Initial diagnosis Multigene prognostic tests performed: None Histologic grading system: 3 grade system   05/12/2022 Initial Diagnosis   Invasive carcinoma of breast (HCC)    Menach 9 First birth 24 No OCP Total hysterectomy  salpingo-oophorectomy at age of 66  Hormone replacement therapy - Estrogen patch x 5 year or more.  Denies previous breast biopsy or radiation.  Family history niece [sister's daughter] with  breast cancer.    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   Cancer (HCC) 2006   melanoma on shoulder    Chronic kidney disease, stage I     Chronic kidney disease, stage III (moderate) (HCC)    Diverticulitis of colon (without mention of hemorrhage)(562.11)    GERD (gastroesophageal reflux disease)    Glaucoma    Hypertension    Migraine without aura, without mention of intractable migraine without mention of status migrainosus    Osteoarthritis    Osteopenia    Other abnormal glucose    Other and unspecified hyperlipidemia    PONV (postoperative nausea and vomiting)    has used scopolamine patches   Postablative ovarian failure    Pure hypercholesterolemia    Unspecified glaucoma(365.9)    Unspecified hypothyroidism    Vertigo    none for over 2 yrs    SURGICAL HISTORY: Past Surgical History:  Procedure Laterality Date   APPENDECTOMY     BASAL CELL CARCINOMA EXCISION     BASAL CELL CARCINOMA EXCISION  2014   nasal   BREAST BIOPSY Left 04/08/2022   US biopsy 2 areas/ coil clip 4 oc, 4:30 venus clip/ path pending   BREAST BIOPSY Left 04/08/2022   [place] LT BREAST BX W LOC DEV 1ST LESION IMG BX SPEC US GUIDE 04/08/2022 [facility]   BREAST BIOPSY Left 04/08/2022   [place] LT BREAST BX W LOC DEV EA ADD LESION IMG BX SPEC US GUIDE 04/08/2022 [facility]   BREAST BIOPSY  04/27/2022   MM LT RADIO FREQUENCY TAG EA ADD LESION LOC MAMMO GUIDE 04/27/2022 [facility]   CATARACT EXTRACTION W/PHACO Left 07/03/2019   Procedure: CATARACT EXTRACTION PHACO AND INTRAOCULAR LENS PLACEMENT (IOC) LEFT KAHOOK DUAL BLADE GONIOTOMY 9.84 01:14.5 13.2%;  Surgeon: Lockie Mola, MD;  Location: [facility];  Service: Ophthalmology;  Laterality: Left;   CATARACT EXTRACTION W/PHACO Right 08/07/2019   Procedure: CATARACT EXTRACTION PHACO AND INTRAOCULAR LENS PLACEMENT (IOC) RIGHT KAHOOK DUAL BLADE GONIOTOMY;  Surgeon: Lockie Mola, MD;  Location: [facility];  Service: Ophthalmology;  Laterality: Right;  6.46 0:59.4 10.9%   CHOLECYSTECTOMY     COLONOSCOPY     ORIF PATELLA Right 08/15/2015   Procedure: OPEN  REDUCTION INTERNAL (ORIF) FIXATION PATELLA  and application wound vac;  Surgeon: Christena Flake, MD;  Location: [facility];  Service: Orthopedics;  Laterality: Right;   TONSILLECTOMY     VAGINAL HYSTERECTOMY      SOCIAL HISTORY: Social History   Socioeconomic History   Marital status: Widowed    Spouse name: Elijah Birk   Number of children: 2   Years of education: some college   Highest education level: 12th grade  Occupational History   Occupation: Retired  Tobacco Use   Smoking status: Never   Smokeless tobacco: Never   Tobacco comments:    smoking cessation materials not required  Vaping Use   Vaping Use: Never used  Substance and Sexual Activity   Alcohol use: No   Drug use: No   Sexual activity: Not Currently    Birth control/protection: Surgical    Comment: TAH-1989  Other Topics Concern   Not on file  Social History Narrative   She has been a widow since 2009. Pt lives alone.    Social Determinants of Health   Financial Resource Strain: Low Risk  (08/11/2020)   Overall Financial Resource Strain (CARDIA)    Difficulty of Paying Living Expenses: Not hard at all  Food Insecurity: No Food Insecurity (08/11/2020)   Hunger Vital Sign  Worried About Programme researcher, broadcasting/film/video in the Last Year: Never true    Ran Out of Food in the Last Year: Never true  Transportation Needs: No Transportation Needs (08/11/2020)   PRAPARE - Administrator, Civil Service (Medical): No    Lack of Transportation (Non-Medical): No  Physical Activity: Insufficiently Active (08/11/2020)   Exercise Vital Sign    Days of Exercise per Week: 7 days    Minutes of Exercise per Session: 10 min  Stress: No Stress Concern Present (08/11/2020)   [employer] of Occupational Health - Occupational Stress Questionnaire    Feeling of Stress : Not at all  Social Connections: Moderately Integrated (08/11/2020)   Social Connection and Isolation Panel [NHANES]    Frequency of Communication with Friends and Family:  More than three times a week    Frequency of Social Gatherings with Friends and Family: Three times a week    Attends Religious Services: More than 4 times per year    Active Member of Clubs or Organizations: Yes    Attends Banker Meetings: More than 4 times per year    Marital Status: Widowed  Intimate Partner Violence: Not At Risk (08/11/2020)   Humiliation, Afraid, Rape, and Kick questionnaire    Fear of Current or Ex-Partner: No    Emotionally Abused: No    Physically Abused: No    Sexually Abused: No    FAMILY HISTORY: Family History  Problem Relation Age of Onset   Heart attack Father        x2   Heart disease Sister    Kidney disease Sister    Diabetes Sister    Heart attack Brother    Diabetes Brother    Heart attack Brother    Breast cancer Neg Hx     ALLERGIES:  is allergic to codeine and oxycodone.  MEDICATIONS:  Current Outpatient Medications  Medication Sig Dispense Refill   acetaminophen (TYLENOL) 500 MG tablet Take 500 mg by mouth daily as needed.     alendronate (FOSAMAX) 70 MG tablet Take 1 tablet (70 mg total) by mouth once a week. 90 tablet 1   cholecalciferol (VITAMIN D) 1000 units tablet Take 1 capsule by mouth once a week.     famotidine (PEPCID) 40 MG tablet TAKE 1 TABLET BY MOUTH EVERY DAY (Patient taking differently: Take 40 mg by mouth every evening.) 90 tablet 1   fluticasone (FLONASE) 50 MCG/ACT nasal spray Place 1 spray into both nostrils at bedtime as needed. SPRAY 1 SPRAY INTO EACH NOSTRIL EVERY DAY     ipratropium (ATROVENT) 0.06 % nasal spray Place 2 sprays into both nostrils 4 (four) times daily as needed.     irbesartan (AVAPRO) 75 MG tablet Take 1 tablet (75 mg total) by mouth daily. (Patient taking differently: Take 75 mg by mouth every evening.) 90 tablet 1   Multiple Vitamin (MULTIVITAMIN) tablet Take 1 tablet by mouth daily.     Multiple Vitamins-Minerals (HAIR SKIN & NAILS) TABS Take 1 tablet by mouth 2 (two) times daily.      pravastatin (PRAVACHOL) 80 MG tablet Take 1 tablet (80 mg total) by mouth daily. 90 tablet 1   SYNTHROID 75 MCG tablet Take 1 tablet (75 mcg total) by mouth daily before breakfast. (Patient taking differently: Take 75 mcg by mouth daily before breakfast. Brand name only) 90 tablet 0   timolol (TIMOPTIC) 0.5 % ophthalmic solution Place 1 drop into both eyes 2 (two) times daily.  vitamin C (ASCORBIC ACID) 500 MG tablet Take 1,000 mg by mouth every evening.     docusate sodium (COLACE) 100 MG capsule Take 1 capsule (100 mg total) by mouth 2 (two) times daily as needed for up to 10 days for mild constipation. (Patient not taking: Reported on 05/12/2022) 20 capsule 0   traMADol (ULTRAM) 50 MG tablet Take 1 tablet (50 mg total) by mouth every 6 (six) hours as needed. (Patient not taking: Reported on 05/12/2022) 10 tablet 0   No current facility-administered medications for this visit.    Review of Systems  Constitutional:  Negative for appetite change, chills, fatigue and fever.  HENT:   Negative for hearing loss and voice change.   Eyes:  Negative for eye problems.  Respiratory:  Negative for chest tightness and cough.   Cardiovascular:  Negative for chest pain.  Gastrointestinal:  Negative for abdominal distention, abdominal pain and blood in stool.  Endocrine: Negative for hot flashes.  Genitourinary:  Negative for difficulty urinating and frequency.   Musculoskeletal:  Negative for arthralgias.  Skin:  Negative for itching and rash.  Neurological:  Negative for extremity weakness.  Hematological:  Negative for adenopathy.  Psychiatric/Behavioral:  Negative for confusion.      PHYSICAL EXAMINATION: ECOG PERFORMANCE STATUS: 0 - Asymptomatic  Vitals:   05/12/22 0941  BP: 139/74  Pulse: 68  Resp: 18  Temp: (!) 97.1 F (36.2 C)  SpO2: 98%   Filed Weights   05/12/22 0941  Weight: 156 lb 11.2 oz (71.1 kg)    Physical Exam Constitutional:      General: She is not in acute  distress.    Appearance: She is not diaphoretic.  HENT:     Head: Normocephalic and atraumatic.     Nose: Nose normal.     Mouth/Throat:     Pharynx: No oropharyngeal exudate.  Eyes:     General: No scleral icterus.    Pupils: Pupils are equal, round, and reactive to light.  Cardiovascular:     Rate and Rhythm: Normal rate and regular rhythm.     Heart sounds: No murmur heard. Pulmonary:     Effort: Pulmonary effort is normal. No respiratory distress.     Breath sounds: No rales.  Chest:     Chest wall: No tenderness.  Abdominal:     General: There is no distension.     Palpations: Abdomen is soft.     Tenderness: There is no abdominal tenderness.  Musculoskeletal:        General: Normal range of motion.     Cervical back: Normal range of motion and neck supple.  Skin:    General: Skin is warm and dry.     Findings: No erythema.  Neurological:     Mental Status: She is alert and oriented to person, place, and time.     Cranial Nerves: No cranial nerve deficit.     Motor: No abnormal muscle tone.     Coordination: Coordination normal.  Psychiatric:        Mood and Affect: Affect normal.   Breast exam was performed in seated and lying down position. Patient is status post left lumpectomy with a healing surgical scar and focal bruising.  No palpable breast masses bilaterally.  No palpable axillary adenopathy bilaterally.   LABORATORY DATA:  I have reviewed the data as listed    Latest Ref Rng & Units 03/29/2022   10:32 AM 02/25/2021   10:43 AM 03/25/2019   12:20  PM  CBC  WBC 3.8 - 10.8 Thousand/uL 4.8  5.0  5.0   Hemoglobin 11.7 - 15.5 g/dL 16.1  09.6  04.5   Hematocrit 35.0 - 45.0 % 39.5  39.6  39.1   Platelets 140 - 400 Thousand/uL 215  219  221       Latest Ref Rng & Units 03/29/2022   10:32 AM 02/25/2021   10:43 AM 02/19/2020    2:13 PM  CMP  Glucose 65 - 99 mg/dL 409  811  91   BUN 7 - 25 mg/dL 17  21  24    Creatinine 0.60 - 0.95 mg/dL 9.14  7.82  9.56    Sodium 135 - 146 mmol/L 141  142  139   Potassium 3.5 - 5.3 mmol/L 4.7  4.9  4.6   Chloride 98 - 110 mmol/L 106  105  104   CO2 20 - 32 mmol/L 26  29  26    Calcium 8.6 - 10.4 mg/dL 9.9  21.3  08.6   Total Protein 6.1 - 8.1 g/dL 7.5  7.5  7.4   Total Bilirubin 0.2 - 1.2 mg/dL 0.6  0.6  0.5   AST 10 - 35 U/L 22  20  20    ALT 6 - 29 U/L 27  23  24       RADIOGRAPHIC STUDIES: I have personally reviewed the radiological images as listed and agreed with the findings in the report. MM Breast Surgical Specimen  Result Date: 05/06/2022 CLINICAL DATA:  Status post 2 site RF ID tag localized LEFT breast lumpectomy. EXAM: SPECIMEN RADIOGRAPH OF THE LEFT BREAST COMPARISON:  Previous exam(s). FINDINGS: Status post excision of the LEFT breast. The venus and COIL clip are present within the specimen. There is a single radiofrequency ID tag present in the specimen. This was discussed with the OR team at time of phone call. They report that the second RF ID tag is present within an additional specimen. Additional specimen radiograph is deferred by the team. IMPRESSION: Specimen radiograph of the LEFT breast. Single radiofrequency tag as described above. OR team is aware. Electronically Signed   By: Meda Klinefelter M.D.   On: 05/06/2022 12:25  MM LT RADIO FREQUENCY TAG LOC MAMMO GUIDE  Result Date: 04/27/2022 CLINICAL DATA:  Patient is status post 2 site ultrasound-guided biopsy demonstrating papillomas (venus and COIL). Patient has elected for surgical excision. EXAM: NEEDLE LOCALIZATION OF THE LEFT BREAST WITH MAMMO GUIDANCE x2 COMPARISON:  Previous exam(s). FINDINGS: Patient presents for needle localization prior to surgical excision. I met with the patient and we discussed the procedure of needle localization including benefits and alternatives. We discussed the high likelihood of a successful procedure. We discussed the risks of the procedure, including infection, bleeding, tissue injury, and further  surgery. Informed, written consent was given. The usual time-out protocol was performed immediately prior to the procedure. Site 1 Using mammographic guidance, sterile technique, 1% lidocaine and a 7 cm radiofrequency ID tag needle ([id number]), the venus clip was localized using a lateral approach. Function was confirmed with an auditory signal from the guide. The images were marked for Dr. Tonna Boehringer. Site 2: Using mammographic guidance, sterile technique, 1% lidocaine and a 7 cm radiofrequency ID tag needle ([id number]), the COIL clip was localized using a lateral approach. Function was confirmed with an auditory signal from the guide. The images were marked for Dr. Tonna Boehringer. IMPRESSION: Radiofrequency ID tag localization of the LEFT breast at 2 sites. No apparent complications. Electronically Signed  By: Meda Klinefelter M.D.   On: 04/27/2022 16:25  MM LT RADIO FREQUENCY TAG EA ADD LESION LOC MAMMO GUIDE  Result Date: 04/27/2022 CLINICAL DATA:  Patient is status post 2 site ultrasound-guided biopsy demonstrating papillomas (venus and COIL). Patient has elected for surgical excision. EXAM: NEEDLE LOCALIZATION OF THE LEFT BREAST WITH MAMMO GUIDANCE x2 COMPARISON:  Previous exam(s). FINDINGS: Patient presents for needle localization prior to surgical excision. I met with the patient and we discussed the procedure of needle localization including benefits and alternatives. We discussed the high likelihood of a successful procedure. We discussed the risks of the procedure, including infection, bleeding, tissue injury, and further surgery. Informed, written consent was given. The usual time-out protocol was performed immediately prior to the procedure. Site 1 Using mammographic guidance, sterile technique, 1% lidocaine and a 7 cm radiofrequency ID tag needle ([id number]), the venus clip was localized using a lateral approach. Function was confirmed with an auditory signal from the guide. The images were marked for Dr. Tonna Boehringer. Site 2:  Using mammographic guidance, sterile technique, 1% lidocaine and a 7 cm radiofrequency ID tag needle ([id number]), the COIL clip was localized using a lateral approach. Function was confirmed with an auditory signal from the guide. The images were marked for Dr. Tonna Boehringer. IMPRESSION: Radiofrequency ID tag localization of the LEFT breast at 2 sites. No apparent complications. Electronically Signed   By: Meda Klinefelter M.D.   On: 04/27/2022 16:25

## 2022-05-12 NOTE — Progress Notes (Signed)
Accompanied patient and family to initial medical oncology appointment.   Reviewed Breast Cancer treatment handbook.   Care plan summary given to patient.   Reviewed outreach programs and cancer center services.   

## 2022-05-12 NOTE — Assessment & Plan Note (Signed)
Discussed with patient

## 2022-05-13 ENCOUNTER — Encounter: Payer: Self-pay | Admitting: *Deleted

## 2022-05-13 NOTE — Progress Notes (Signed)
Spoke to [name] and let her know that her biomarkers came back as triple negative.   Dr. Cathie Hoops is recommending a SLNB and Dr. Geoffery Lyons office will be reaching out to get that scheduled.  We will see her back after the biopsy to discuss further tx.

## 2022-05-16 ENCOUNTER — Other Ambulatory Visit: Payer: Self-pay | Admitting: Surgery

## 2022-05-16 DIAGNOSIS — C50512 Malignant neoplasm of lower-outer quadrant of left female breast: Secondary | ICD-10-CM

## 2022-05-16 LAB — SURGICAL PATHOLOGY

## 2022-05-18 ENCOUNTER — Ambulatory Visit: Payer: Self-pay | Admitting: Surgery

## 2022-05-18 NOTE — H&P (Addendum)
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 female who returns for evaluation of above. No issues post op.    Past Medical History:  has a past medical history of Allergy to mold, Chickenpox, GERD (gastroesophageal reflux disease), Hyperlipidemia, Hypothyroidism, and Seasonal allergies.  Past Surgical History:  has a past surgical history that includes open reduction and internal fixation of comminuted right patella fracture (Right, 08/15/2015); Tonsillectomy; Hysterectomy; Cholecystectomy; Melanoma removed from Left Shoulder; Left pinky finger surgery; Basal cell carcinoma excision 2014 ; Colonoscopy (01/20/1999); Colonoscopy (09/04/2008); egd (09/04/2008); and mastectomy partial (Left, 05/06/2022).  Family History: family history includes Cirrhosis in her brother; Diabetes in her father and sister; Diabetes type I in her brother; Heart failure in her sister; Kidney failure in her sister; Multiple sclerosis in her mother; Myocardial Infarction (Heart attack) in her brother and father.  Social History:  reports that she has never smoked. She has never used smokeless tobacco. She reports that she does not drink alcohol and does not use drugs.  Current Medications: has a current medication list which includes the following prescription(s): acetaminophen, alendronate, ascorbic acid (vitamin c), biotin, cholecalciferol, famotidine, fluticasone propionate, ipratropium, irbesartan, levothyroxine, multivitamin, pravastatin, timolol maleate, calcium polycarbophil, and lidocaine-prilocaine.  Allergies:  Allergies  Allergen Reactions   Codeine Other (See Comments)    Patient had insomnia and nausea   Oxycodone Other (See Comments)    Made her "Loopy" Made her "Loopy"    ROS:  A 15 point review of systems was performed and pertinent positives and negatives noted in  HPI   Objective:     BP (!) 163/113   Pulse 75   Ht 162.6 cm (5\' 4" )   Wt 69.4 kg (153 lb)   BMI 26.26 kg/m   Constitutional :  No distress, cooperative, alert  Lymphatics/Throat:  Supple with no lymphadenopathy  Respiratory:  Clear to auscultation bilaterally  Cardiovascular:  Regular rate and rhythm  Gastrointestinal: Soft, non-tender, non-distended, no organomegaly.  Musculoskeletal: Steady gait and movement  Skin: Cool and moist  Psychiatric: Normal affect, non-agitated, not confused  Breast:  Chaperone present for exam.  Left breast incisions healing well.      LABS:  Reason for Addendum #1:  Breast Biomarker Results  Reason for Addendum #2:  Immunohistochemistry results   Specimen Submitted:  A. Breast, left  B. Breast, left superior margin   Clinical History: Papilloma of left breast D24.2     DIAGNOSIS:  A. BREAST, LEFT; EXCISION:  - INVASIVE MAMMARY CARCINOMA, NO SPECIAL TYPE.  - ADJACENT SCLEROSED INTRADUCTAL PAPILLOMA, WITH ASSOCIATED USUAL DUCTAL  HYPERPLASIA.  - CLIPS X 2, AND RF TAG IDENTIFIED.  - SEE CANCER SUMMARY.   Comment:  Immunohistochemical studies directed against myoepithelial markers p63  and calponin were performed on selected tissue blocks, and demonstrate  an absence of myoepithelial staining, compatible with invasive  carcinoma.   This case is reviewed together with the prior biopsies ([id number]).  There is no evidence of invasive carcinoma seen in the original  biopsies.   B. BREAST, LEFT, ADDITIONAL SUPERIOR MARGIN; EXCISION:  - BENIGN MAMMARY PARENCHYMA WITH FIBROCYSTIC AND APOCRINE CHANGES, AND  FOCAL USUAL DUCTAL HYPERPLASIA.  - RF TAG PRESENT.  - NEGATIVE FOR ATYPICAL PROLIFERATIVE BREAST DISEASE.   CANCER CASE SUMMARY: INVASIVE CARCINOMA OF THE BREAST  Standard(s): AJCC-UICC 8   SPECIMEN  Procedure: Excision  Specimen Laterality: Left  TUMOR  Histologic Type: Invasive carcinoma of no special type (ductal)   Histologic Grade ([place] Histologic Score)       Glandular (Acinar)/Tubular Differentiation: 1       Nuclear Pleomorphism: 2       Mitotic Rate: 1       Overall Grade: Grade 1  Tumor Size: 10 mm  Tumor Focality: Single focus of invasive carcinoma   Ductal Carcinoma In Situ (DCIS): Not identified  Tumor Extent: Not applicable  Lymphatic and/or Vascular Invasion: Not identified   Treatment Effect in the Breast: No known presurgical therapy   MARGINS  Margin Status for Invasive Carcinoma: All margins negative for invasive  carcinoma      Distance from closest margin: 2 mm       Specify closest margin: Posterior/deep and medial   Margin Status for DCIS: Not applicable (no DCIS in specimen)    REGIONAL LYMPH NODES  Regional Lymph Node Status: Not applicable (no regional lymph nodes  submitted or found)   DISTANT METASTASIS  Distant Site(s) Involved, if applicable: Not applicable   PATHOLOGIC STAGE CLASSIFICATION (pTNM, AJCC 8th Edition):  Modified Classification: Not applicable  pT Category: pT1b       T Suffix: Not applicable  pN Category: pN not assigned       N Suffix: Not applicable  pM Category: Not applicable   SPECIAL STUDIES  Breast Biomarker Testing will be performed on the current specimen, and  will be reported as an addendum   (v4.9.0.1)   IHC slides were prepared by [employer], [place]. All controls stained  appropriately.   This test was developed and its performance characteristics determined  by [facility]. It has not been cleared or approved by the [place] Food and Drug  Administration. The FDA does not require this test to go through  premarket FDA review. This test is used for clinical purposes. It should  not be regarded as investigational or for research. This laboratory is  certified under the Clinical Laboratory Improvement Amendments (CLIA) as  qualified to perform high complexity clinical laboratory testing.   GROSS DESCRIPTION:   Intraoperative Consultation:      Labeled: Left breast mass      Received: Fresh      Specimen: Papilloma left breast      Pathologic evaluation performed: Gross margin evaluation      Diagnosis: IOC: RF tag and clips x 2 present.  Fibrosis extends to  posterior margin      Communicated to: Dr. Tonna Boehringer at 12:40 PM on 05/06/2022 by Katherine Mantle,  MD     Tissue submitted: None   A. Labeled: Left breast mass  Received: Fresh  Specimen radiograph image(s) available for review  Radiographic findings: An RF tag, coil clip, and Venus clip are present  Time in fixative: Collected at 12:08 PM on 05/06/2022 and placed in  formalin at 12:43 PM on 05/06/2022                                   cold  ischemic time: Approximately 35 minutes  Total fixation time: Approximately 7.5 hours  Type of procedure: Partial mastectomy  Location / laterality of specimen: Left  Orientation of specimen: The specimen is received inked and oriented  Inking:  Anterior = green  Inferior = blue  Lateral = orange  Medial = yellow  Posterior = black  Superior = red  Size of  specimen: 3.5 x 3.5 x 2.5 cm  Skin: None grossly identified  Biopsy site: 2 biopsy sites are identified, 1 of which contains a coil  clip and 1 of which contains a Venus clip.   Number of discrete masses: No discrete masses are identified.  An  ill-defined area of fibrosis with surrounding fat necrosis is  identified.  Both clips are identified within this area  Distance between masses/clips: 0.7 cm.  The clips are located in  adjacent slices of tissue.  The tissue between clip sites is entirely  submitted with the Venus clip site sections  Margins: Venus clip = 0.5 cm to posterior, 1.2 cm to anterior, 1.5 cm to  medial, 1.2 cm to lateral, 0.9 cm to inferior, and 2.6 cm to superior;  coil clip-0.5 cm to posterior, 1.2 cm to anterior, 1.5 cm to medial, 1.2  cm to lateral, 0.5 cm to inferior, and 3.0 cm to superior  Description of remainder of  tissue: The remaining tissue is yellow and  lobular with approximately 20% intermixed fibrous tissue   Block summary (approximately 50% of specimen submitted):  1 - perpendicular sections of inferior margin  2-3 - Venus clip site  4 - section immediately adjacent to Venus clip site  5-6 - coil clip site  7 - representative section superior to clip sites  8 - perpendicular sections of superior margin   Intraoperative Consultation:      Labeled: Left breast superior margin      Received: Fresh      Specimen: Left breast papilloma      Pathologic evaluation performed: Gross margin evaluation      Diagnosis: IOC: RF tag present.  No concerning lesion identified      Communicated to: Dr. Tonna Boehringer at 12:40 PM on 05/06/2022 by Katherine Mantle,  MD     Tissue submitted: None   B. Labeled: Left breast superior margin  Received: Fresh  Time in fixative: Collected at 12:14 PM on 05/06/2022 and placed in  formalin at 12:43 PM on 05/06/2022                                   cold  ischemic time: Approximately 29 minutes  Total fixation time: Approximately 7.5 hours  Type of procedure: Partial mastectomy  Location / laterality of specimen: Left  Orientation of specimen: The specimen is received inked and oriented  Inking:  Anterior = green  Inferior = blue  Lateral = orange  Medial = yellow  Posterior = black  Superior = red  Size of specimen: 3.0 x 3.0 x 0.6 cm  Skin: None grossly identified  Biopsy site: None grossly identified  Number of discrete masses: None grossly identified  Description of remainder of tissue: The tissue is yellow and lobular  with approximately 20% intermixed fibrous tissue. An RF tag is present  loosely adherent to the specimen.   Block summary (entirely submitted):  1 - perpendicular sections of lateral  2-4 - central specimen, entirely submitted sequentially from lateral to  medial  5 - perpendicular sections of medial   CM 05/06/2022       Final Diagnosis  performed by Katherine Mantle, MD.   Electronically signed  05/10/2022 4:07:31PM  The electronic signature indicates that the named Attending Pathologist  has evaluated the specimen  Technical component performed at [facility], [address] [place],  [place] [address] Lab: [phone] Dir: Jolene Schimke, MD,  MMM   Professional component performed at [facility], [facility], [address] [place], [place], [place] [address] Lab: [phone]  Dir: Beryle Quant, MD   ADDENDUM:  CASE SUMMARY: BREAST BIOMARKER TESTS  Estrogen Receptor (ER) Status: NEGATIVE (LESS THAN 1%)          Internal control cells present and stain as expected  A second pathologist review was performed by Dr. Elijah Birk.   Progesterone Receptor (PgR) Status: NEGATIVE (LESS THAN 1%)          Internal control cells present and stain as expected   HER2 (by immunohistochemistry): NEGATIVE (Score 1+)  Ki-67: Not performed   Given the presence of the triple negative immunophenotype, and  relatively bland appearance of invasive carcinoma, additional  immunohistochemical studies were performed. Tumor cells are positive for  CK7 and GATA3, confirming invasive mammary carcinoma. There is diffuse  marking for CK5/6, which can be seen in invasive mammary carcinoma with  a "basal-like" phenotype, typically associated with triple negative  marking, and more aggressive clinical course. S100 is also positive, and  may be seen in approximately 50% of invasive mammary carcinomas. Of  note, GATA3 expression is typically negative or only focally and weakly  positive in adnexal tumors such as syringoma, which morphologically  mimic the invasive carcinoma present.   Cold Ischemia and Fixation Times: Meet requirements specified in latest  version of the ASCO/CAP guidelines  Testing Performed on Block Number(s): A 4   METHODS  Fixative: Formalin  Estrogen Receptor:  FDA cleared ([place]) Primary Antibody:  SP1  Progesterone  Receptor: FDA cleared ([place]) Primary Antibody: 1E2  HER2 (by IHC): FDA approved ([place]) Primary Antibody: 4B5 ([facility])  Immunohistochemistry controls worked appropriately. Slides were prepared  by Sammuel Hines, [place], and interpreted by Katherine Mantle, MD.  (v1.5.0.1)     Addendum #1 performed by Katherine Mantle, MD.   Electronically signed  05/12/2022 1:15:12PM  The electronic signature indicates that the named Attending Pathologist  has evaluated the specimen  Technical component performed at [facility], [address], [place],  [place] [address] Lab: [phone] Dir: Jolene Schimke, MD, MMM   Professional component performed at [facility], [facility], [address] [place], [place], [place] [address] Lab: [phone]  Dir: Beryle Quant, MD   ADDENDUM:  An immunohistochemical study for Ki-67 was performed, and shows a  relatively low Ki-67 staining index, with approximately 3-5% positivity  in neoplastic nuclei.   IHC slides were prepared by Sammuel Hines, [place]. All controls stained  appropriately.   This test was developed and its performance characteristics determined  by [facility]. It has not been cleared or approved by the [place] Food and Drug  Administration. The FDA does not require this test to go through  premarket FDA review. This test is used for clinical purposes. It should  not be regarded as investigational or for research. This laboratory is  certified under the Clinical Laboratory Improvement Amendments (CLIA) as  qualified to perform high complexity clinical laboratory testing.     Addendum #2 performed by Katherine Mantle, MD.   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   The patient verbalized understanding and all questions were answered to the patient's satisfaction.  2. Patient has elected to proceed with surgical treatment. Procedure will be scheduled. LEFT  labs/images/medications/previous chart entries reviewed personally and relevant changes/updates noted above.

## 2022-05-18 NOTE — H&P (View-Only) (Signed)
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 female who returns for evaluation of above. No issues post op.    Past Medical History:  has a past medical history of Allergy to mold, Chickenpox, GERD (gastroesophageal reflux disease), Hyperlipidemia, Hypothyroidism, and Seasonal allergies.  Past Surgical History:  has a past surgical history that includes open reduction and internal fixation of comminuted right patella fracture (Right, 08/15/2015); Tonsillectomy; Hysterectomy; Cholecystectomy; Melanoma removed from Left Shoulder; Left pinky finger surgery; Basal cell carcinoma excision 2014 ; Colonoscopy (01/20/1999); Colonoscopy (09/04/2008); egd (09/04/2008); and mastectomy partial (Left, 05/06/2022).  Family History: family history includes Cirrhosis in her brother; Diabetes in her father and sister; Diabetes type I in her brother; Heart failure in her sister; Kidney failure in her sister; Multiple sclerosis in her mother; Myocardial Infarction (Heart attack) in her brother and father.  Social History:  reports that she has never smoked. She has never used smokeless tobacco. She reports that she does not drink alcohol and does not use drugs.  Current Medications: has a current medication list which includes the following prescription(s): acetaminophen, alendronate, ascorbic acid (vitamin c), biotin, cholecalciferol, famotidine, fluticasone propionate, ipratropium, irbesartan, levothyroxine, multivitamin, pravastatin, timolol maleate, calcium polycarbophil, and lidocaine-prilocaine.  Allergies:  Allergies  Allergen Reactions   Codeine Other (See Comments)    Patient had insomnia and nausea   Oxycodone Other (See Comments)    Made her "Loopy" Made her "Loopy"    ROS:  A 15 point review of systems was performed and pertinent positives and negatives noted in  HPI   Objective:     BP (!) 163/113   Pulse 75   Ht 162.6 cm (5' 4")   Wt 69.4 kg (153 lb)   BMI 26.26 kg/m   Constitutional :  No distress, cooperative, alert  Lymphatics/Throat:  Supple with no lymphadenopathy  Respiratory:  Clear to auscultation bilaterally  Cardiovascular:  Regular rate and rhythm  Gastrointestinal: Soft, non-tender, non-distended, no organomegaly.  Musculoskeletal: Steady gait and movement  Skin: Cool and moist  Psychiatric: Normal affect, non-agitated, not confused  Breast:  Chaperone present for exam.  Left breast incisions healing well.      LABS:  Reason for Addendum #1:  Breast Biomarker Results  Reason for Addendum #2:  Immunohistochemistry results   Specimen Submitted:  A. Breast, left  B. Breast, left superior margin   Clinical History: Papilloma of left breast D24.2     DIAGNOSIS:  A. BREAST, LEFT; EXCISION:  - INVASIVE MAMMARY CARCINOMA, NO SPECIAL TYPE.  - ADJACENT SCLEROSED INTRADUCTAL PAPILLOMA, WITH ASSOCIATED USUAL DUCTAL  HYPERPLASIA.  - CLIPS X 2, AND RF TAG IDENTIFIED.  - SEE CANCER SUMMARY.   Comment:  Immunohistochemical studies directed against myoepithelial markers p63  and calponin were performed on selected tissue blocks, and demonstrate  an absence of myoepithelial staining, compatible with invasive  carcinoma.   This case is reviewed together with the prior biopsies ([id number]).  There is no evidence of invasive carcinoma seen in the original  biopsies.   B. BREAST, LEFT, ADDITIONAL SUPERIOR MARGIN; EXCISION:  - BENIGN MAMMARY PARENCHYMA WITH FIBROCYSTIC AND APOCRINE CHANGES, AND  FOCAL USUAL DUCTAL HYPERPLASIA.  - RF TAG PRESENT.  - NEGATIVE FOR ATYPICAL PROLIFERATIVE BREAST DISEASE.   CANCER CASE SUMMARY: INVASIVE CARCINOMA OF THE BREAST  Standard(s): AJCC-UICC 8   SPECIMEN  Procedure: Excision  Specimen Laterality: Left     TUMOR  Histologic Type: Invasive carcinoma of no special type (ductal)   Histologic Grade (Nottingham Histologic Score)       Glandular (Acinar)/Tubular Differentiation: 1       Nuclear Pleomorphism: 2       Mitotic Rate: 1       Overall Grade: Grade 1  Tumor Size: 10 mm  Tumor Focality: Single focus of invasive carcinoma   Ductal Carcinoma In Situ (DCIS): Not identified  Tumor Extent: Not applicable  Lymphatic and/or Vascular Invasion: Not identified   Treatment Effect in the Breast: No known presurgical therapy   MARGINS  Margin Status for Invasive Carcinoma: All margins negative for invasive  carcinoma      Distance from closest margin: 2 mm       Specify closest margin: Posterior/deep and medial   Margin Status for DCIS: Not applicable (no DCIS in specimen)    REGIONAL LYMPH NODES  Regional Lymph Node Status: Not applicable (no regional lymph nodes  submitted or found)   DISTANT METASTASIS  Distant Site(s) Involved, if applicable: Not applicable   PATHOLOGIC STAGE CLASSIFICATION (pTNM, AJCC 8th Edition):  Modified Classification: Not applicable  pT Category: pT1b       T Suffix: Not applicable  pN Category: pN not assigned       N Suffix: Not applicable  pM Category: Not applicable   SPECIAL STUDIES  Breast Biomarker Testing will be performed on the current specimen, and  will be reported as an addendum   (v4.9.0.1)   IHC slides were prepared by [facility] [place], [place]. All controls stained  appropriately.   This test was developed and its performance characteristics determined  by [facility]. It has not been cleared or approved by the US Food and Drug  Administration. The FDA does not require this test to go through  premarket FDA review. This test is used for clinical purposes. It should  not be regarded as investigational or for research. This laboratory is  certified under the Clinical Laboratory Improvement Amendments (CLIA) as  qualified to perform high complexity clinical laboratory testing.   GROSS DESCRIPTION:   Intraoperative Consultation:      Labeled: Left breast mass      Received: Fresh      Specimen: Papilloma left breast      Pathologic evaluation performed: Gross margin evaluation      Diagnosis: IOC: RF tag and clips x 2 present.  Fibrosis extends to  posterior margin      Communicated to: Dr. Dayton Sherr at 12:40 PM on 05/06/2022 by Heath Jones,  MD     Tissue submitted: None   A. Labeled: Left breast mass  Received: Fresh  Specimen radiograph image(s) available for review  Radiographic findings: An RF tag, coil clip, and Venus clip are present  Time in fixative: Collected at 12:08 PM on 05/06/2022 and placed in  formalin at 12:43 PM on 05/06/2022                                   cold  ischemic time: Approximately 35 minutes  Total fixation time: Approximately 7.5 hours  Type of procedure: Partial mastectomy  Location / laterality of specimen: Left  Orientation of specimen: The specimen is received inked and oriented  Inking:  Anterior = green  Inferior = blue  Lateral = orange  Medial = yellow  Posterior = black  Superior = red  Size of   specimen: 3.5 x 3.5 x 2.5 cm  Skin: None grossly identified  Biopsy site: 2 biopsy sites are identified, 1 of which contains a coil  clip and 1 of which contains a Venus clip.   Number of discrete masses: No discrete masses are identified.  An  ill-defined area of fibrosis with surrounding fat necrosis is  identified.  Both clips are identified within this area  Distance between masses/clips: 0.7 cm.  The clips are located in  adjacent slices of tissue.  The tissue between clip sites is entirely  submitted with the Venus clip site sections  Margins: Venus clip = 0.5 cm to posterior, 1.2 cm to anterior, 1.5 cm to  medial, 1.2 cm to lateral, 0.9 cm to inferior, and 2.6 cm to superior;  coil clip-0.5 cm to posterior, 1.2 cm to anterior, 1.5 cm to medial, 1.2  cm to lateral, 0.5 cm to inferior, and 3.0 cm to superior  Description of remainder of  tissue: The remaining tissue is yellow and  lobular with approximately 20% intermixed fibrous tissue   Block summary (approximately 50% of specimen submitted):  1 - perpendicular sections of inferior margin  2-3 - Venus clip site  4 - section immediately adjacent to Venus clip site  5-6 - coil clip site  7 - representative section superior to clip sites  8 - perpendicular sections of superior margin   Intraoperative Consultation:      Labeled: Left breast superior margin      Received: Fresh      Specimen: Left breast papilloma      Pathologic evaluation performed: Gross margin evaluation      Diagnosis: IOC: RF tag present.  No concerning lesion identified      Communicated to: Dr. Garyn Waguespack at 12:40 PM on 05/06/2022 by Heath Jones,  MD     Tissue submitted: None   B. Labeled: Left breast superior margin  Received: Fresh  Time in fixative: Collected at 12:14 PM on 05/06/2022 and placed in  formalin at 12:43 PM on 05/06/2022                                   cold  ischemic time: Approximately 29 minutes  Total fixation time: Approximately 7.5 hours  Type of procedure: Partial mastectomy  Location / laterality of specimen: Left  Orientation of specimen: The specimen is received inked and oriented  Inking:  Anterior = green  Inferior = blue  Lateral = orange  Medial = yellow  Posterior = black  Superior = red  Size of specimen: 3.0 x 3.0 x 0.6 cm  Skin: None grossly identified  Biopsy site: None grossly identified  Number of discrete masses: None grossly identified  Description of remainder of tissue: The tissue is yellow and lobular  with approximately 20% intermixed fibrous tissue. An RF tag is present  loosely adherent to the specimen.   Block summary (entirely submitted):  1 - perpendicular sections of lateral  2-4 - central specimen, entirely submitted sequentially from lateral to  medial  5 - perpendicular sections of medial   CM 05/06/2022       Final Diagnosis  performed by Heath Jones, MD.   Electronically signed  05/10/2022 4:07:31PM  The electronic signature indicates that the named Attending Pathologist  has evaluated the specimen  Technical component performed at [facility], [address], [place],  [place] [address] Lab: [phone] Dir: Sanjai Nagendra, MD,   MMM   Professional component performed at [facility], [facility], [address], [place], [place] [address] Lab: [phone]  Dir: Heath M. Jones, MD   ADDENDUM:  CASE SUMMARY: BREAST BIOMARKER TESTS  Estrogen Receptor (ER) Status: NEGATIVE (LESS THAN 1%)          Internal control cells present and stain as expected  A second pathologist review was performed by Dr. Tara Rubinas.   Progesterone Receptor (PgR) Status: NEGATIVE (LESS THAN 1%)          Internal control cells present and stain as expected   HER2 (by immunohistochemistry): NEGATIVE (Score 1+)  Ki-67: Not performed   Given the presence of the triple negative immunophenotype, and  relatively bland appearance of invasive carcinoma, additional  immunohistochemical studies were performed. Tumor cells are positive for  CK7 and GATA3, confirming invasive mammary carcinoma. There is diffuse  marking for CK5/6, which can be seen in invasive mammary carcinoma with  a "basal-like" phenotype, typically associated with triple negative  marking, and more aggressive clinical course. S100 is also positive, and  may be seen in approximately 50% of invasive mammary carcinomas. Of  note, GATA3 expression is typically negative or only focally and weakly  positive in adnexal tumors such as syringoma, which morphologically  mimic the invasive carcinoma present.   Cold Ischemia and Fixation Times: Meet requirements specified in latest  version of the ASCO/CAP guidelines  Testing Performed on Block Number(s): A 4   METHODS  Fixative: Formalin  Estrogen Receptor:  FDA cleared ([place]) Primary Antibody:  SP1  Progesterone  Receptor: FDA cleared ([place]) Primary Antibody: 1E2  HER2 (by IHC): FDA approved ([place]) Primary Antibody: 4B5 ([facility])  Immunohistochemistry controls worked appropriately. Slides were prepared  by [facility] [place], [place], and interpreted by Heath Jones, MD.  (v1.5.0.1)     Addendum #1 performed by Heath Jones, MD.   Electronically signed  05/12/2022 1:15:12PM  The electronic signature indicates that the named Attending Pathologist  has evaluated the specimen  Technical component performed at [facility], [address], [place],  [place] [address] Lab: [phone] Dir: Sanjai Nagendra, MD, MMM   Professional component performed at [facility], [facility], [address], [place], [place] [address] Lab: [phone]  Dir: Heath M. Jones, MD   ADDENDUM:  An immunohistochemical study for Ki-67 was performed, and shows a  relatively low Ki-67 staining index, with approximately 3-5% positivity  in neoplastic nuclei.   IHC slides were prepared by [facility] [place], [place]. All controls stained  appropriately.   This test was developed and its performance characteristics determined  by [facility]. It has not been cleared or approved by the US Food and Drug  Administration. The FDA does not require this test to go through  premarket FDA review. This test is used for clinical purposes. It should  not be regarded as investigational or for research. This laboratory is  certified under the Clinical Laboratory Improvement Amendments (CLIA) as  qualified to perform high complexity clinical laboratory testing.     Addendum #2 performed by Heath Jones, MD.   Electronically signed  05/16/2022 12:52:44PM  The electronic signature indicates that the named Attending Pathologist  has evaluated the specimen  Technical component performed at [facility], [address], [place],   [address] Lab: [phone] Dir: Sanjai Nagendra, MD, MMM   Professional component performed at [facility], [facility], [address], [place],  [address] Lab: [phone]  Dir: Heath M. 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   The patient verbalized understanding and all questions were answered to the patient's satisfaction.  2. Patient has elected to proceed with surgical treatment. Procedure will be scheduled. LEFT  labs/images/medications/previous chart entries reviewed personally and relevant changes/updates noted above.  

## 2022-05-20 ENCOUNTER — Encounter
Admission: RE | Admit: 2022-05-20 | Discharge: 2022-05-20 | Disposition: A | Discharge status: Home / Self Care | Payer: Medicare HMO | Source: Ambulatory Visit | Attending: Surgery | Admitting: Surgery

## 2022-05-20 HISTORY — DX: Secondary hyperparathyroidism of renal origin: N25.81

## 2022-05-20 HISTORY — DX: Palpitations: R00.2

## 2022-05-20 HISTORY — DX: Fatty (change of) liver, not elsewhere classified: O26.611

## 2022-05-20 HISTORY — DX: Malignant neoplasm of unspecified site of unspecified female breast: C50.919

## 2022-05-20 HISTORY — DX: Fatty (change of) liver, not elsewhere classified: K76.0

## 2022-05-20 HISTORY — DX: Diverticulitis of intestine, part unspecified, without perforation or abscess without bleeding: K57.92

## 2022-05-20 NOTE — Patient Instructions (Addendum)
Your procedure is scheduled on:05-27-22 Friday Report to the Registration Desk on the 1st floor of the Medical Mall.Then proceed to the 2nd floor Surgery Desk To find out your arrival time, please call [phone] between 1PM - 3PM on:05-26-22 Thursday If your arrival time is 6:00 am, do not arrive before that time as the Medical Mall entrance doors do not open until 6:00 am.  REMEMBER: Instructions that are not followed completely may result in serious medical risk, up to and including death; or upon the discretion of your surgeon and anesthesiologist your surgery may need to be rescheduled.  Do not eat food after midnight the night before surgery.  No gum chewing or hard candies.  You may however, drink CLEAR liquids up to 2 hours before you are scheduled to arrive for your surgery. Do not drink anything within 2 hours of your scheduled arrival time.  Clear liquids include: - water  - apple juice without pulp - gatorade (not RED colors) - black coffee or tea (Do NOT add milk or creamers to the coffee or tea) Do NOT drink anything that is not on this list.  One week prior to surgery: Stop Anti-inflammatories (NSAIDS) such as Advil, Aleve, Ibuprofen, Motrin, Naproxen, Naprosyn and Aspirin based products such as Excedrin, Goody's Powder, BC Powder.You may however, take Tylenol if needed for pain up until the day of surgery. Stop ANY OVER THE COUNTER supplements/vitamins NOW (05-20-22) until after surgery (Vitamin D, Multivitamin, Hair Skin and Nails, Vitamin C)  TAKE ONLY THESE MEDICATIONS THE MORNING OF SURGERY WITH A SIP OF WATER: -pravastatin (PRAVACHOL)  -SYNTHROID  -famotidine (PEPCID)-take one the night before surgery and one the morning of surgery  No Alcohol for 24 hours before or after surgery.  No Smoking including e-cigarettes for 24 hours before surgery.  No chewable tobacco products for at least 6 hours before surgery.  No nicotine patches on the day of surgery.  Do not  use any "recreational" drugs for at least a week (preferably 2 weeks) before your surgery.  Please be advised that the combination of cocaine and anesthesia may have negative outcomes, up to and including death. If you test positive for cocaine, your surgery will be cancelled.  On the morning of surgery brush your teeth with toothpaste and water, you may rinse your mouth with mouthwash if you wish. Do not swallow any toothpaste or mouthwash.  Use CHG Soap as directed on instruction sheet.  Do not wear jewelry, make-up, hairpins, clips or nail polish.  Do not wear lotions, powders, or perfumes.   Do not shave body hair from the neck down 48 hours before surgery.  Contact lenses, hearing aids and dentures may not be worn into surgery.  Do not bring valuables to the hospital. [facility] is not responsible for any missing/lost belongings or valuables.   Notify your doctor if there is any change in your medical condition (cold, fever, infection).  Wear comfortable clothing (specific to your surgery type) to the hospital.  After surgery, you can help prevent lung complications by doing breathing exercises.  Take deep breaths and cough every 1-2 hours. Your doctor may order a device called an Incentive Spirometer to help you take deep breaths. When coughing or sneezing, hold a pillow firmly against your incision with both hands. This is called "splinting." Doing this helps protect your incision. It also decreases belly discomfort.  If you are being admitted to the hospital overnight, leave your suitcase in the car. After surgery it  may be brought to your room.  In case of increased patient census, it may be necessary for you, the patient, to continue your postoperative care in the Same Day Surgery department.  If you are being discharged the day of surgery, you will not be allowed to drive home. You will need a responsible individual to drive you home and stay with you for 24 hours after  surgery.   If you are taking public transportation, you will need to have a responsible individual with you.  Please call the Pre-admissions Testing Dept. at [phone] if you have any questions about these instructions.  Surgery Visitation Policy:  Patients having surgery or a procedure may have two visitors.  Children under the age of 30 must have an adult with them who is not the patient.     Preparing for Surgery with CHLORHEXIDINE GLUCONATE (CHG) Soap  Chlorhexidine Gluconate (CHG) Soap  o An antiseptic cleaner that kills germs and bonds with the skin to continue killing germs even after washing  o Used for showering the night before surgery and morning of surgery  Before surgery, you can play an important role by reducing the number of germs on your skin.  CHG (Chlorhexidine gluconate) soap is an antiseptic cleanser which kills germs and bonds with the skin to continue killing germs even after washing.  Please do not use if you have an allergy to CHG or antibacterial soaps. If your skin becomes reddened/irritated stop using the CHG.  1. Shower the NIGHT BEFORE SURGERY and the MORNING OF SURGERY with CHG soap.  2. If you choose to wash your hair, wash your hair first as usual with your normal shampoo.  3. After shampooing, rinse your hair and body thoroughly to remove the shampoo.  4. Use CHG as you would any other liquid soap. You can apply CHG directly to the skin and wash gently with a scrungie or a clean washcloth.  5. Apply the CHG soap to your body only from the neck down. Do not use on open wounds or open sores. Avoid contact with your eyes, ears, mouth, and genitals (private parts). Wash face and genitals (private parts) with your normal soap.  6. Wash thoroughly, paying special attention to the area where your surgery will be performed.  7. Thoroughly rinse your body with warm water.  8. Do not shower/wash with your normal soap after using and rinsing off  the CHG soap.  9. Pat yourself dry with a clean towel.  10. Wear clean pajamas to bed the night before surgery.  12. Place clean sheets on your bed the night of your first shower and do not sleep with pets.  13. Shower again with the CHG soap on the day of surgery prior to arriving at the hospital.  14. Do not apply any deodorants/lotions/powders.  15. Please wear clean clothes to the hospital.

## 2022-05-23 ENCOUNTER — Encounter: Payer: Self-pay | Admitting: *Deleted

## 2022-05-23 NOTE — Progress Notes (Signed)
Spoke with [name], answered several questions she had.

## 2022-05-27 ENCOUNTER — Other Ambulatory Visit: Payer: Self-pay

## 2022-05-27 ENCOUNTER — Ambulatory Visit
Admission: RE | Admit: 2022-05-27 | Discharge: 2022-05-27 | Disposition: A | Discharge status: Home / Self Care | Payer: Medicare HMO | Source: Ambulatory Visit | Attending: Surgery | Admitting: Surgery

## 2022-05-27 ENCOUNTER — Encounter: Payer: Self-pay | Admitting: Surgery

## 2022-05-27 ENCOUNTER — Encounter
Admission: RE | Disposition: A | Discharge status: Home / Self Care | Payer: Self-pay | Source: Home / Self Care | Attending: Surgery

## 2022-05-27 ENCOUNTER — Ambulatory Visit: Payer: Medicare HMO | Admitting: Anesthesiology

## 2022-05-27 ENCOUNTER — Ambulatory Visit
Admission: RE | Admit: 2022-05-27 | Discharge: 2022-05-27 | Disposition: A | Discharge status: Home / Self Care | Payer: Medicare HMO | Attending: Surgery | Admitting: Surgery

## 2022-05-27 DIAGNOSIS — Z85828 Personal history of other malignant neoplasm of skin: Secondary | ICD-10-CM | POA: Insufficient documentation

## 2022-05-27 DIAGNOSIS — I129 Hypertensive chronic kidney disease with stage 1 through stage 4 chronic kidney disease, or unspecified chronic kidney disease: Secondary | ICD-10-CM | POA: Insufficient documentation

## 2022-05-27 DIAGNOSIS — C50512 Malignant neoplasm of lower-outer quadrant of left female breast: Secondary | ICD-10-CM | POA: Diagnosis not present

## 2022-05-27 DIAGNOSIS — E039 Hypothyroidism, unspecified: Secondary | ICD-10-CM | POA: Diagnosis not present

## 2022-05-27 DIAGNOSIS — N183 Chronic kidney disease, stage 3 unspecified: Secondary | ICD-10-CM | POA: Insufficient documentation

## 2022-05-27 DIAGNOSIS — K219 Gastro-esophageal reflux disease without esophagitis: Secondary | ICD-10-CM | POA: Diagnosis not present

## 2022-05-27 DIAGNOSIS — C50919 Malignant neoplasm of unspecified site of unspecified female breast: Secondary | ICD-10-CM

## 2022-05-27 DIAGNOSIS — E785 Hyperlipidemia, unspecified: Secondary | ICD-10-CM | POA: Insufficient documentation

## 2022-05-27 HISTORY — PX: AXILLARY LYMPH NODE DISSECTION: SHX5229

## 2022-05-27 SURGERY — LYMPHADENECTOMY, AXILLARY
Anesthesia: General | Site: Breast | Laterality: Left

## 2022-05-27 MED ORDER — CHLORHEXIDINE GLUCONATE 0.12 % MT SOLN
OROMUCOSAL | Status: AC
  Filled 2022-05-27: qty 15

## 2022-05-27 MED ORDER — METHYLENE BLUE 1 % INJ SOLN
INTRAVENOUS | Status: AC
  Filled 2022-05-27: qty 10

## 2022-05-27 MED ORDER — SODIUM CHLORIDE (PF) 0.9 % IJ SOLN
INTRAVENOUS | Status: DC | PRN
  Administered 2022-05-27: 20 mL via INTRAMUSCULAR

## 2022-05-27 MED ORDER — CEFAZOLIN SODIUM-DEXTROSE 2-4 GM/100ML-% IV SOLN
2.0000 g | INTRAVENOUS | Status: AC
  Administered 2022-05-27: 2 g via INTRAVENOUS

## 2022-05-27 MED ORDER — HYDROMORPHONE HCL 1 MG/ML IJ SOLN: INTRAMUSCULAR | Status: DC | PRN

## 2022-05-27 MED ORDER — TRAMADOL HCL 50 MG PO TABS
50.0000 mg | ORAL_TABLET | Freq: Four times a day (QID) | ORAL | Status: DC | PRN
  Administered 2022-05-27: 50 mg via ORAL

## 2022-05-27 MED ORDER — LIDOCAINE HCL 1 % IJ SOLN
INTRAMUSCULAR | Status: DC | PRN
  Administered 2022-05-27: 5 mL

## 2022-05-27 MED ORDER — PROPOFOL 10 MG/ML IV BOLUS
INTRAVENOUS | Status: DC | PRN
  Administered 2022-05-27: 50 mg via INTRAVENOUS
  Administered 2022-05-27: 150 mg via INTRAVENOUS

## 2022-05-27 MED ORDER — SODIUM CHLORIDE (PF) 0.9 % IJ SOLN
INTRAMUSCULAR | Status: AC
  Filled 2022-05-27: qty 50

## 2022-05-27 MED ORDER — SCOPOLAMINE 1 MG/3DAYS TD PT72
MEDICATED_PATCH | TRANSDERMAL | Status: AC
  Filled 2022-05-27: qty 1

## 2022-05-27 MED ORDER — FENTANYL CITRATE (PF) 100 MCG/2ML IJ SOLN
25.0000 ug | INTRAMUSCULAR | Status: DC | PRN
  Administered 2022-05-27 (×3): 50 ug via INTRAVENOUS

## 2022-05-27 MED ORDER — TRAMADOL HCL 50 MG PO TABS: 50.0000 mg | ORAL_TABLET | Freq: Four times a day (QID) | ORAL | 0 refills | Status: DC | PRN

## 2022-05-27 MED ORDER — ONDANSETRON HCL 4 MG/2ML IJ SOLN
INTRAMUSCULAR | Status: AC
  Filled 2022-05-27: qty 2

## 2022-05-27 MED ORDER — LACTATED RINGERS IV SOLN: INTRAVENOUS | Status: DC

## 2022-05-27 MED ORDER — ACETAMINOPHEN 10 MG/ML IV SOLN
INTRAVENOUS | Status: AC
  Filled 2022-05-27: qty 100

## 2022-05-27 MED ORDER — SCOPOLAMINE 1 MG/3DAYS TD PT72
1.0000 | MEDICATED_PATCH | TRANSDERMAL | Status: DC
  Administered 2022-05-27: 1.5 mg via TRANSDERMAL

## 2022-05-27 MED ORDER — FAMOTIDINE 40 MG PO TABS
40.0000 mg | ORAL_TABLET | ORAL | Status: AC | PRN
Start: 2022-05-27 — End: ?

## 2022-05-27 MED ORDER — ORAL CARE MOUTH RINSE: 15.0000 mL | Freq: Once | OROMUCOSAL | Status: AC

## 2022-05-27 MED ORDER — STERILE WATER FOR IRRIGATION IR SOLN
Status: DC | PRN
  Administered 2022-05-27: 20 mL

## 2022-05-27 MED ORDER — CEFAZOLIN SODIUM-DEXTROSE 2-4 GM/100ML-% IV SOLN
INTRAVENOUS | Status: AC
  Filled 2022-05-27: qty 100

## 2022-05-27 MED ORDER — CHLORHEXIDINE GLUCONATE CLOTH 2 % EX PADS
6.0000 | MEDICATED_PAD | Freq: Once | CUTANEOUS | Status: AC
  Administered 2022-05-27: 6 via TOPICAL

## 2022-05-27 MED ORDER — KETOROLAC TROMETHAMINE 30 MG/ML IJ SOLN
INTRAMUSCULAR | Status: AC
  Filled 2022-05-27: qty 1

## 2022-05-27 MED ORDER — PROPOFOL 10 MG/ML IV BOLUS
INTRAVENOUS | Status: AC
  Filled 2022-05-27: qty 20

## 2022-05-27 MED ORDER — CHLORHEXIDINE GLUCONATE 0.12 % MT SOLN
15.0000 mL | Freq: Once | OROMUCOSAL | Status: AC
  Administered 2022-05-27: 15 mL via OROMUCOSAL

## 2022-05-27 MED ORDER — BUPIVACAINE-EPINEPHRINE 0.5% -1:200000 IJ SOLN
INTRAMUSCULAR | Status: DC | PRN
  Administered 2022-05-27: 5 mL

## 2022-05-27 MED ORDER — LIDOCAINE HCL (PF) 2 % IJ SOLN
INTRAMUSCULAR | Status: AC
  Filled 2022-05-27: qty 5

## 2022-05-27 MED ORDER — TECHNETIUM TC 99M TILMANOCEPT KIT
1.0000 | PACK | Freq: Once | INTRAVENOUS | Status: AC | PRN
  Administered 2022-05-27: 0.939 via INTRADERMAL

## 2022-05-27 MED ORDER — FENTANYL CITRATE (PF) 100 MCG/2ML IJ SOLN
INTRAMUSCULAR | Status: AC
  Filled 2022-05-27: qty 2

## 2022-05-27 MED ORDER — DEXAMETHASONE SODIUM PHOSPHATE 10 MG/ML IJ SOLN
INTRAMUSCULAR | Status: AC
  Filled 2022-05-27: qty 1

## 2022-05-27 MED ORDER — BUPIVACAINE HCL (PF) 0.5 % IJ SOLN
INTRAMUSCULAR | Status: AC
  Filled 2022-05-27: qty 30

## 2022-05-27 MED ORDER — LIDOCAINE HCL (CARDIAC) PF 100 MG/5ML IV SOSY
PREFILLED_SYRINGE | INTRAVENOUS | Status: DC | PRN
  Administered 2022-05-27: 60 mg via INTRAVENOUS

## 2022-05-27 MED ORDER — ACETAMINOPHEN 10 MG/ML IV SOLN
1000.0000 mg | Freq: Once | INTRAVENOUS | Status: DC | PRN
  Administered 2022-05-27: 1000 mg via INTRAVENOUS

## 2022-05-27 MED ORDER — DOCUSATE SODIUM 100 MG PO CAPS: 100.0000 mg | ORAL_CAPSULE | Freq: Two times a day (BID) | ORAL | 0 refills | Status: AC | PRN

## 2022-05-27 MED ORDER — DEXAMETHASONE SODIUM PHOSPHATE 10 MG/ML IJ SOLN
INTRAMUSCULAR | Status: DC | PRN
  Administered 2022-05-27: 8 mg via INTRAVENOUS

## 2022-05-27 MED ORDER — EPINEPHRINE PF 1 MG/ML IJ SOLN
INTRAMUSCULAR | Status: AC
  Filled 2022-05-27: qty 1

## 2022-05-27 MED ORDER — ONDANSETRON HCL 4 MG/2ML IJ SOLN
4.0000 mg | Freq: Once | INTRAMUSCULAR | Status: AC | PRN
  Administered 2022-05-27: 4 mg via INTRAVENOUS

## 2022-05-27 MED ORDER — LIDOCAINE HCL (PF) 1 % IJ SOLN
INTRAMUSCULAR | Status: AC
  Filled 2022-05-27: qty 30

## 2022-05-27 MED ORDER — TRAMADOL HCL 50 MG PO TABS
ORAL_TABLET | ORAL | Status: AC
  Filled 2022-05-27: qty 1

## 2022-05-27 SURGICAL SUPPLY — 47 items
ADH SKN CLS APL DERMABOND .7 (GAUZE/BANDAGES/DRESSINGS) ×1
APL PRP STRL LF DISP 70% ISPRP (MISCELLANEOUS) ×1
APPLIER CLIP 11 MED OPEN (CLIP)
APR CLP MED 11 20 MLT OPN (CLIP)
BLADE PHOTON ILLUMINATED (MISCELLANEOUS) ×2 IMPLANT
BLADE SURG 15 STRL LF DISP TIS (BLADE) ×2 IMPLANT
BLADE SURG 15 STRL SS (BLADE) ×1
CHLORAPREP W/TINT 26 (MISCELLANEOUS) ×2 IMPLANT
CLIP APPLIE 11 MED OPEN (CLIP) IMPLANT
CNTNR URN SCR LID CUP LEK RST (MISCELLANEOUS) IMPLANT
CONT SPEC 4OZ STRL OR WHT (MISCELLANEOUS)
DERMABOND ADVANCED .7 DNX12 (GAUZE/BANDAGES/DRESSINGS) ×2 IMPLANT
DEVICE DUBIN SPECIMEN MAMMOGRA (MISCELLANEOUS) ×2 IMPLANT
DRAPE LAPAROTOMY 77X122 PED (DRAPES) ×2 IMPLANT
ELECT CAUTERY BLADE TIP 2.5 (TIP) ×1
ELECT REM PT RETURN 9FT ADLT (ELECTROSURGICAL) ×1
ELECTRODE CAUTERY BLDE TIP 2.5 (TIP) ×2 IMPLANT
ELECTRODE REM PT RTRN 9FT ADLT (ELECTROSURGICAL) ×2 IMPLANT
GAUZE 4X4 16PLY ~~LOC~~+RFID DBL (SPONGE) ×2 IMPLANT
GLOVE BIOGEL PI IND STRL 7.0 (GLOVE) ×2 IMPLANT
GLOVE SURG SYN 6.5 ES PF (GLOVE) ×3 IMPLANT
GLOVE SURG SYN 6.5 PF PI (GLOVE) ×4 IMPLANT
GOWN STRL REUS W/ TWL LRG LVL3 (GOWN DISPOSABLE) ×6 IMPLANT
GOWN STRL REUS W/TWL LRG LVL3 (GOWN DISPOSABLE) ×3
KIT MARKER MARGIN INK (KITS) ×2 IMPLANT
KIT TURNOVER KIT A (KITS) ×2 IMPLANT
LABEL OR SOLS (LABEL) ×2 IMPLANT
LIGHT WAVEGUIDE WIDE FLAT (MISCELLANEOUS) IMPLANT
MANIFOLD NEPTUNE II (INSTRUMENTS) ×2 IMPLANT
MARKER MARGIN CORRECT CLIP (MARKER) ×2 IMPLANT
NDL HYPO 22X1.5 SAFETY MO (MISCELLANEOUS) ×4 IMPLANT
NEEDLE HYPO 22X1.5 SAFETY MO (MISCELLANEOUS) ×2 IMPLANT
PACK BASIN MINOR ARMC (MISCELLANEOUS) ×2 IMPLANT
SET LOCALIZER 20 PROBE US (MISCELLANEOUS) ×2 IMPLANT
SUT MNCRL 4-0 (SUTURE) ×1
SUT MNCRL 4-0 27XMFL (SUTURE) ×1
SUT SILK 3 0 12 30 (SUTURE) IMPLANT
SUT VIC AB 3-0 SH 27 (SUTURE) ×1
SUT VIC AB 3-0 SH 27X BRD (SUTURE) ×4 IMPLANT
SUTURE MNCRL 4-0 27XMF (SUTURE) ×4 IMPLANT
SYR 20ML LL LF (SYRINGE) ×2 IMPLANT
SYR BULB IRRIG 60ML STRL (SYRINGE) ×2 IMPLANT
TRAP FLUID SMOKE EVACUATOR (MISCELLANEOUS) ×2 IMPLANT
TRAP NEPTUNE SPECIMEN COLLECT (MISCELLANEOUS) ×2 IMPLANT
TUBING CONNECTING 10 (TUBING) ×2 IMPLANT
WATER STERILE IRR 1000ML POUR (IV SOLUTION) ×2 IMPLANT
WATER STERILE IRR 500ML POUR (IV SOLUTION) ×2 IMPLANT

## 2022-05-27 NOTE — Interval H&P Note (Signed)
History and Physical Interval Note:  05/27/2022 8:46 AM  Theresa Hall  has presented today for surgery, with the diagnosis of malignant neoplasm of lower-outer quadrant of left female breast, unspecified estrogen receptor status C50.512.  The various methods of treatment have been discussed with the patient and family. After consideration of risks, benefits and other options for treatment, the patient has consented to  Procedure(s): AXILLARY LYMPH NODE DISSECTION w/ SN (Left) as a surgical intervention.  The patient's history has been reviewed, patient examined, no change in status, stable for surgery.  I have reviewed the patient's chart and labs.  Questions were answered to the patient's satisfaction.     Keith Cancio Tonna Boehringer

## 2022-05-27 NOTE — Anesthesia Preprocedure Evaluation (Addendum)
Anesthesia Evaluation  Patient identified by MRN, date of birth, ID band Patient awake    Reviewed: Allergy & Precautions, NPO status , Patient's Chart, lab work & pertinent test results  History of Anesthesia Complications (+) PONV and history of anesthetic complications  Airway Mallampati: IV   Neck ROM: Full    Dental no notable dental hx.    Pulmonary neg pulmonary ROS   Pulmonary exam normal breath sounds clear to auscultation       Cardiovascular hypertension, Normal cardiovascular exam Rhythm:Regular Rate:Normal  ECG 05/09/22: normal   Neuro/Psych  Headaches    GI/Hepatic ,GERD  Medicated and Controlled,,  Endo/Other  Hypothyroidism    Renal/GU Renal disease (stage III CKD)     Musculoskeletal   Abdominal   Peds  Hematology negative hematology ROS (+)   Anesthesia Other Findings   Reproductive/Obstetrics                             Anesthesia Physical Anesthesia Plan  ASA: 2  Anesthesia Plan: General   Post-op Pain Management:    Induction: Intravenous  PONV Risk Score and Plan: 4 or greater and Ondansetron, Dexamethasone, Treatment may vary due to age or medical condition and Scopolamine patch - Pre-op  Airway Management Planned: LMA  Additional Equipment:   Intra-op Plan:   Post-operative Plan: Extubation in OR  Informed Consent: I have reviewed the patients History and Physical, chart, labs and discussed the procedure including the risks, benefits and alternatives for the proposed anesthesia with the patient or authorized representative who has indicated his/her understanding and acceptance.     Dental advisory given  Plan Discussed with: CRNA  Anesthesia Plan Comments: (Patient consented for risks of anesthesia including but not limited to:  - adverse reactions to medications - damage to eyes, teeth, lips or other oral mucosa - nerve damage due to  positioning  - sore throat or hoarseness - damage to heart, brain, nerves, lungs, other parts of body or loss of life  Informed patient about role of CRNA in peri- and intra-operative care.  Patient voiced understanding.)        Anesthesia Quick Evaluation

## 2022-05-27 NOTE — Anesthesia Procedure Notes (Signed)
Procedure Name: LMA Insertion Date/Time: 05/27/2022 11:45 AM  Performed by: Jannet Mantis, CRNAPre-anesthesia Checklist: Patient identified, Patient being monitored, Timeout performed, Emergency Drugs available and Suction available Patient Re-evaluated:Patient Re-evaluated prior to induction Oxygen Delivery Method: Circle system utilized Preoxygenation: Pre-oxygenation with 100% oxygen Induction Type: IV induction Ventilation: Mask ventilation without difficulty LMA: LMA inserted LMA Size: 4.0 Tube type: Oral Number of attempts: 2 Placement Confirmation: positive ETCO2 and breath sounds checked- equal and bilateral Tube secured with: Tape Dental Injury: Teeth and Oropharynx as per pre-operative assessment

## 2022-05-27 NOTE — Transfer of Care (Signed)
Immediate Anesthesia Transfer of Care Note  Patient: Theresa Hall  Procedure(s) Performed: AXILLARY LYMPH NODE DISSECTION w/ SN (Left: Breast)  Patient Location: PACU  Anesthesia Type:General  Level of Consciousness: awake  Airway & Oxygen Therapy: Patient Spontanous Breathing  Post-op Assessment: Report given to RN  Post vital signs: stable  Last Vitals:  Vitals Value Taken Time  BP 78/69 05/27/22 1233  Temp    Pulse 62 05/27/22 1236  Resp 20 05/27/22 1236  SpO2 100 % 05/27/22 1236  Vitals shown include unvalidated device data.  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   Cancer (HCC) 2006   melanoma on shoulder    Chronic kidney disease, stage III (moderate) (HCC)    Diverticulitis    Diverticulitis of colon (without mention of hemorrhage)(562.11)    GERD (gastroesophageal reflux disease)    Glaucoma    Hypertension    Invasive carcinoma of breast (HCC)    Migraine without aura, without mention of intractable migraine without mention of status migrainosus    Osteoarthritis    Osteopenia    Other abnormal glucose    Other and unspecified hyperlipidemia    Palpitations    PONV (postoperative nausea and vomiting)    has used scopolamine patches   Postablative ovarian failure    Pure hypercholesterolemia    Secondary hyperparathyroidism of renal origin (HCC)    Unspecified glaucoma(365.9)    Unspecified hypothyroidism    Vertigo    none for over 2 yrs   Past Surgical History:  Procedure Laterality Date   APPENDECTOMY     BASAL CELL CARCINOMA EXCISION     BASAL CELL CARCINOMA EXCISION  2014   nasal   BREAST BIOPSY Left 04/08/2022   US biopsy 2 areas/ coil clip 4 oc, 4:30 venus clip/ path pending   BREAST BIOPSY Left  04/08/2022   [place] LT BREAST BX W LOC DEV 1ST LESION IMG BX SPEC US GUIDE 04/08/2022 [facility]   BREAST BIOPSY Left 04/08/2022   [place] LT BREAST BX W LOC DEV EA ADD LESION IMG BX SPEC US GUIDE 04/08/2022 [facility]   BREAST BIOPSY  04/27/2022   MM LT RADIO FREQUENCY TAG EA ADD LESION LOC MAMMO GUIDE 04/27/2022 [facility]   CATARACT EXTRACTION W/PHACO Left 07/03/2019   Procedure: CATARACT EXTRACTION PHACO AND INTRAOCULAR LENS PLACEMENT (IOC) LEFT KAHOOK DUAL BLADE GONIOTOMY 9.84 01:14.5 13.2%;  Surgeon: Lockie Mola, MD;  Location: [facility];  Service: Ophthalmology;  Laterality: Left;   CATARACT EXTRACTION W/PHACO Right 08/07/2019   Procedure: CATARACT EXTRACTION PHACO AND INTRAOCULAR LENS PLACEMENT (IOC) RIGHT KAHOOK DUAL BLADE GONIOTOMY;  Surgeon: Lockie Mola, MD;  Location: [facility];  Service: Ophthalmology;  Laterality: Right;  6.46 0:59.4 10.9%   CHOLECYSTECTOMY     COLONOSCOPY     ORIF PATELLA Right 08/15/2015   Procedure: OPEN REDUCTION INTERNAL (ORIF) FIXATION PATELLA  and application wound vac;  Surgeon: Christena Flake, MD;  Location: [facility];  Service: Orthopedics;  Laterality: Right;   TONSILLECTOMY     VAGINAL HYSTERECTOMY     Scheduled Meds:  scopolamine  1 patch Transdermal Q72H   Continuous Infusions:  acetaminophen     lactated ringers  lactated ringers 750 mL/hr at 05/27/22 1203   PRN Meds:.acetaminophen, fentaNYL (SUBLIMAZE) injection     Complications: No notable events documented.

## 2022-05-27 NOTE — Anesthesia Procedure Notes (Signed)
Procedure Name: LMA Insertion Date/Time: 05/27/2022 11:47 AM  Performed by: Elmarie Mainland, CRNAPre-anesthesia Checklist: Patient identified, Emergency Drugs available, Suction available and Patient being monitored Patient Re-evaluated:Patient Re-evaluated prior to induction Preoxygenation: Pre-oxygenation with 100% oxygen Induction Type: IV induction Ventilation: Mask ventilation without difficulty LMA: LMA inserted LMA Size: 3.0 Number of attempts: 2 Placement Confirmation: positive ETCO2 Tube secured with: Tape

## 2022-05-27 NOTE — Discharge Instructions (Addendum)
Removal, Care After This sheet gives you information about how to care for yourself after your procedure. Your health care provider may also give you more specific instructions. If you have problems or questions, contact your health care provider. What can I expect after the procedure? After the procedure, it is common to have: Soreness. Bruising. Itching. Follow these instructions at home: site care Follow instructions from your health care provider about how to take care of your site. Make sure you: Wash your hands with soap and water before and after you change your bandage (dressing). If soap and water are not available, use hand sanitizer. Leave stitches (sutures), skin glue, or adhesive strips in place. These skin closures may need to stay in place for 2 weeks or longer. If adhesive strip edges start to loosen and curl up, you may trim the loose edges. Do not remove adhesive strips completely unless your health care provider tells you to do that. If the area bleeds or bruises, apply gentle pressure for 10 minutes. OK TO SHOWER IN 24HRS  Check your site every day for signs of infection. Check for: Redness, swelling, or pain. Fluid or blood. Warmth. Pus or a bad smell.  General instructions Rest and then return to your normal activities as told by your health care provider.  tylenol  as needed for discomfort.   Use narcotics, if prescribed, only when tylenol  is not enough to control pain.  325-650mg every 8hrs to max of 3000mg/24hrs (including the 325mg in every norco dose) for the tylenol.    Keep all follow-up visits as told by your health care provider. This is important. Contact a health care provider if: You have redness, swelling, or pain around your site. You have fluid or blood coming from your site. Your site feels warm to the touch. You have pus or a bad smell coming from your site. You have a fever. Your sutures, skin glue, or adhesive strips loosen or come off sooner  than expected. Get help right away if: You have bleeding that does not stop with pressure or a dressing. Summary After the procedure, it is common to have some soreness, bruising, and itching at the site. Follow instructions from your health care provider about how to take care of your site. Check your site every day for signs of infection. Contact a health care provider if you have redness, swelling, or pain around your site, or your site feels warm to the touch. Keep all follow-up visits as told by your health care provider. This is important. This information is not intended to replace advice given to you by your health care provider. Make sure you discuss any questions you have with your health care provider. Document Released: 01/23/2015 Document Revised: 06/26/2017 Document Reviewed: 06/26/2017 Elsevier Interactive Patient Education  2019 [employer]  AMBULATORY SURGERY  DISCHARGE INSTRUCTIONS   The drugs that you were given will stay in your system until tomorrow so for the next 24 hours you should not:  Drive an automobile Make any legal decisions Drink any alcoholic beverage   You may resume regular meals tomorrow.  Today it is better to start with liquids and gradually work up to solid foods.  You may eat anything you prefer, but it is better to start with liquids, then soup and crackers, and gradually work up to solid foods.   Please notify your doctor immediately if you have any unusual bleeding, trouble breathing, redness and pain at the surgery site, drainage, fever, or   pain not relieved by medication.    Additional Instructions:        Please contact your physician with any problems or Same Day Surgery at [phone], Monday through Friday 6 am to 4 pm, or  at [place] [facility] number at [phone]. 

## 2022-05-27 NOTE — Op Note (Signed)
Preoperative diagnosis: Left breast carcinoma.  Postoperative diagnosis: Same.   Procedure: Left axillary Sentinel Lymph node biopsy  Anesthesia: GETA  Surgeon: Dr. Sung Amabile  Wound Classification: Clean  Indications: Patient is a 82 y.o. female with history of papilloma status post lumpectomy.  Final pathology unfortunately came back as carcinoma.  Here today to proceed with sentinel lymph node biopsy  Specimen: Sentinel Lymph nodes x 1  Complications: None  Estimated Blood Loss: 10 mL  Findings: All blue nodes and high count nodes identified and removed.  Operation performed with curative intent:Yes  Tracer(s) used to identify sentinel nodes in the upfront surgery (non-neoadjuvant) setting (select all that apply):Dye and Radioactive Tracer  Tracer(s) used to identify sentinel nodes in the neoadjuvant setting (select all that apply):N/A  All nodes (colored or non-colored) present at the end of a dye-filled lymphatic channel were removed:Yes   All significantly radioactive nodes were removed:Yes  All palpable suspicious nodes were removed:N/A  Biopsy-proven positive nodes marked with clips prior to chemotherapy were identified and removed:N/A    Description of procedure: RF localization was performed by radiology prior to procedure. In the nuclear medicine suite, the subareolar region was injected with Tc-99 sulfur colloid the morning of procedure. The patient was taken to the operating room and placed supine on the operating table, and after general anesthesia the left breast and axilla were prepped and draped in the usual sterile fashion.  Methylene blue injected.  A time-out was completed verifying correct patient, procedure, site, positioning, and implant(s) and/or special equipment prior to beginning this procedure.  A hand-held gamma probe was used to identify the location of the hottest spot in the axilla. An incision was made around the caudal axillary hairline.  Sharp and blunt Dissection was carried down to subdermal facias. The probe was placed within wound and again, the point of maximal count was found. Dissection continue until nodule was identified.  No was noted to be blue.  The probe was placed in contact with the node and 9000 counts were recorded. The node was excised in its entirety. Ex vivo, the node measured 9200 counts when placed on the probe. The bed of the node measured 100 counts.  No additional hot spots were identified. No additional clinically abnormal nodes or blue color nodes noted. Wound irrigated, hemostasis was achieved and the wound closed in layers with  interrupted sutures of 3-0 Vicryl in deep dermal layer and a running subcuticular suture of Monocryl 4-0, then dressed with dermabond. The patient tolerated the procedure well and was taken to the postanesthesia care unit in stable condition. Sponge and instrument count correct at end of procedure.

## 2022-05-27 NOTE — Anesthesia Postprocedure Evaluation (Signed)
Anesthesia Post Note  Patient: Theresa Hall  Procedure(s) Performed: AXILLARY LYMPH NODE DISSECTION w/ SN (Left: Breast)  Patient location during evaluation: PACU Anesthesia Type: General Level of consciousness: awake and alert, oriented and patient cooperative Pain management: pain level controlled Vital Signs Assessment: post-procedure vital signs reviewed and stable Respiratory status: spontaneous breathing, nonlabored ventilation and respiratory function stable Cardiovascular status: blood pressure returned to baseline and stable Postop Assessment: adequate PO intake Anesthetic complications: no   No notable events documented.   Last Vitals:  Vitals:   05/27/22 1245 05/27/22 1256  BP: (!) 144/70   Pulse: 61 (!) 54  Resp: 11 (!) 8  Temp:    SpO2: 94% 99%    Last Pain:  Vitals:   05/27/22 1256  TempSrc:   PainSc: 8                  Reed Breech

## 2022-05-28 ENCOUNTER — Encounter: Payer: Self-pay | Admitting: Surgery

## 2022-05-31 LAB — SURGICAL PATHOLOGY

## 2022-06-01 ENCOUNTER — Encounter: Payer: Self-pay | Admitting: *Deleted

## 2022-06-01 NOTE — Progress Notes (Signed)
Ms. Goodall has decided to go stay with her daughter in [place].  She requested a referral be sent to Dr. Gaynell Face at [facility] in [place].  Referral, demographics, Dr. Bethanne Ginger note, path reports faxed to [phone].

## 2022-06-15 ENCOUNTER — Other Ambulatory Visit: Payer: Self-pay | Admitting: Family Medicine

## 2022-06-15 ENCOUNTER — Inpatient Hospital Stay: Payer: Medicare HMO

## 2022-06-15 DIAGNOSIS — J302 Other seasonal allergic rhinitis: Secondary | ICD-10-CM

## 2022-06-21 ENCOUNTER — Other Ambulatory Visit: Payer: Self-pay | Admitting: Surgery

## 2022-06-21 DIAGNOSIS — N6321 Unspecified lump in the left breast, upper outer quadrant: Secondary | ICD-10-CM

## 2022-06-23 DIAGNOSIS — Z171 Estrogen receptor negative status [ER-]: Secondary | ICD-10-CM | POA: Diagnosis not present

## 2022-06-23 DIAGNOSIS — C50912 Malignant neoplasm of unspecified site of left female breast: Secondary | ICD-10-CM | POA: Diagnosis not present

## 2022-06-27 ENCOUNTER — Ambulatory Visit
Admission: RE | Admit: 2022-06-27 | Discharge: 2022-06-27 | Disposition: A | Discharge status: Home / Self Care | Payer: Medicare HMO | Source: Ambulatory Visit | Attending: Surgery | Admitting: Surgery

## 2022-06-27 DIAGNOSIS — N6321 Unspecified lump in the left breast, upper outer quadrant: Secondary | ICD-10-CM | POA: Insufficient documentation

## 2022-06-27 DIAGNOSIS — N6489 Other specified disorders of breast: Secondary | ICD-10-CM | POA: Diagnosis not present

## 2022-06-27 DIAGNOSIS — Z853 Personal history of malignant neoplasm of breast: Secondary | ICD-10-CM | POA: Diagnosis not present

## 2022-06-27 DIAGNOSIS — R92322 Mammographic fibroglandular density, left breast: Secondary | ICD-10-CM | POA: Diagnosis not present

## 2022-07-06 ENCOUNTER — Other Ambulatory Visit: Payer: Self-pay | Admitting: Family Medicine

## 2022-07-06 DIAGNOSIS — K219 Gastro-esophageal reflux disease without esophagitis: Secondary | ICD-10-CM

## 2022-07-12 ENCOUNTER — Other Ambulatory Visit: Payer: Self-pay | Admitting: Family Medicine

## 2022-07-12 DIAGNOSIS — J302 Other seasonal allergic rhinitis: Secondary | ICD-10-CM

## 2022-07-21 DIAGNOSIS — C50512 Malignant neoplasm of lower-outer quadrant of left female breast: Secondary | ICD-10-CM | POA: Diagnosis not present

## 2022-07-21 DIAGNOSIS — Z171 Estrogen receptor negative status [ER-]: Secondary | ICD-10-CM | POA: Diagnosis not present

## 2022-07-27 DIAGNOSIS — C50512 Malignant neoplasm of lower-outer quadrant of left female breast: Secondary | ICD-10-CM | POA: Diagnosis not present

## 2022-07-27 DIAGNOSIS — Z171 Estrogen receptor negative status [ER-]: Secondary | ICD-10-CM | POA: Diagnosis not present

## 2022-07-27 DIAGNOSIS — Z510 Encounter for antineoplastic radiation therapy: Secondary | ICD-10-CM | POA: Diagnosis not present

## 2022-07-29 DIAGNOSIS — Z85828 Personal history of other malignant neoplasm of skin: Secondary | ICD-10-CM | POA: Diagnosis not present

## 2022-07-29 DIAGNOSIS — D225 Melanocytic nevi of trunk: Secondary | ICD-10-CM | POA: Diagnosis not present

## 2022-07-29 DIAGNOSIS — Z08 Encounter for follow-up examination after completed treatment for malignant neoplasm: Secondary | ICD-10-CM | POA: Diagnosis not present

## 2022-07-29 DIAGNOSIS — Z85820 Personal history of malignant melanoma of skin: Secondary | ICD-10-CM | POA: Diagnosis not present

## 2022-07-29 DIAGNOSIS — L817 Pigmented purpuric dermatosis: Secondary | ICD-10-CM | POA: Diagnosis not present

## 2022-07-29 DIAGNOSIS — L821 Other seborrheic keratosis: Secondary | ICD-10-CM | POA: Diagnosis not present

## 2022-07-31 DIAGNOSIS — M25511 Pain in right shoulder: Secondary | ICD-10-CM | POA: Diagnosis not present

## 2022-07-31 DIAGNOSIS — M5412 Radiculopathy, cervical region: Secondary | ICD-10-CM | POA: Diagnosis not present

## 2022-08-02 DIAGNOSIS — Z171 Estrogen receptor negative status [ER-]: Secondary | ICD-10-CM | POA: Diagnosis not present

## 2022-08-02 DIAGNOSIS — C50912 Malignant neoplasm of unspecified site of left female breast: Secondary | ICD-10-CM | POA: Diagnosis not present

## 2022-08-02 DIAGNOSIS — C50512 Malignant neoplasm of lower-outer quadrant of left female breast: Secondary | ICD-10-CM | POA: Diagnosis not present

## 2022-08-02 DIAGNOSIS — C50919 Malignant neoplasm of unspecified site of unspecified female breast: Secondary | ICD-10-CM | POA: Diagnosis not present

## 2022-08-08 DIAGNOSIS — Z171 Estrogen receptor negative status [ER-]: Secondary | ICD-10-CM | POA: Diagnosis not present

## 2022-08-08 DIAGNOSIS — Z510 Encounter for antineoplastic radiation therapy: Secondary | ICD-10-CM | POA: Diagnosis not present

## 2022-08-08 DIAGNOSIS — C50512 Malignant neoplasm of lower-outer quadrant of left female breast: Secondary | ICD-10-CM | POA: Diagnosis not present

## 2022-08-12 DIAGNOSIS — C50912 Malignant neoplasm of unspecified site of left female breast: Secondary | ICD-10-CM | POA: Diagnosis not present

## 2022-08-12 DIAGNOSIS — Z171 Estrogen receptor negative status [ER-]: Secondary | ICD-10-CM | POA: Diagnosis not present

## 2022-08-15 DIAGNOSIS — C50512 Malignant neoplasm of lower-outer quadrant of left female breast: Secondary | ICD-10-CM | POA: Diagnosis not present

## 2022-08-15 DIAGNOSIS — Z801 Family history of malignant neoplasm of trachea, bronchus and lung: Secondary | ICD-10-CM | POA: Diagnosis not present

## 2022-08-15 DIAGNOSIS — Z803 Family history of malignant neoplasm of breast: Secondary | ICD-10-CM | POA: Diagnosis not present

## 2022-08-15 DIAGNOSIS — Z85820 Personal history of malignant melanoma of skin: Secondary | ICD-10-CM | POA: Diagnosis not present

## 2022-08-16 DIAGNOSIS — Z171 Estrogen receptor negative status [ER-]: Secondary | ICD-10-CM | POA: Diagnosis not present

## 2022-08-16 DIAGNOSIS — Z510 Encounter for antineoplastic radiation therapy: Secondary | ICD-10-CM | POA: Diagnosis not present

## 2022-08-16 DIAGNOSIS — C50512 Malignant neoplasm of lower-outer quadrant of left female breast: Secondary | ICD-10-CM | POA: Diagnosis not present

## 2022-08-16 DIAGNOSIS — C50412 Malignant neoplasm of upper-outer quadrant of left female breast: Secondary | ICD-10-CM | POA: Diagnosis not present

## 2022-08-17 DIAGNOSIS — Z510 Encounter for antineoplastic radiation therapy: Secondary | ICD-10-CM | POA: Diagnosis not present

## 2022-08-17 DIAGNOSIS — C50412 Malignant neoplasm of upper-outer quadrant of left female breast: Secondary | ICD-10-CM | POA: Diagnosis not present

## 2022-08-17 DIAGNOSIS — C50512 Malignant neoplasm of lower-outer quadrant of left female breast: Secondary | ICD-10-CM | POA: Diagnosis not present

## 2022-08-17 DIAGNOSIS — Z171 Estrogen receptor negative status [ER-]: Secondary | ICD-10-CM | POA: Diagnosis not present

## 2022-08-18 ENCOUNTER — Encounter: Payer: Medicare HMO | Admitting: Family Medicine

## 2022-08-18 DIAGNOSIS — C50412 Malignant neoplasm of upper-outer quadrant of left female breast: Secondary | ICD-10-CM | POA: Diagnosis not present

## 2022-08-18 DIAGNOSIS — Z510 Encounter for antineoplastic radiation therapy: Secondary | ICD-10-CM | POA: Diagnosis not present

## 2022-08-18 DIAGNOSIS — Z171 Estrogen receptor negative status [ER-]: Secondary | ICD-10-CM | POA: Diagnosis not present

## 2022-08-18 DIAGNOSIS — C50512 Malignant neoplasm of lower-outer quadrant of left female breast: Secondary | ICD-10-CM | POA: Diagnosis not present

## 2022-08-19 DIAGNOSIS — Z510 Encounter for antineoplastic radiation therapy: Secondary | ICD-10-CM | POA: Diagnosis not present

## 2022-08-19 DIAGNOSIS — C50512 Malignant neoplasm of lower-outer quadrant of left female breast: Secondary | ICD-10-CM | POA: Diagnosis not present

## 2022-08-19 DIAGNOSIS — Z171 Estrogen receptor negative status [ER-]: Secondary | ICD-10-CM | POA: Diagnosis not present

## 2022-08-19 DIAGNOSIS — C50412 Malignant neoplasm of upper-outer quadrant of left female breast: Secondary | ICD-10-CM | POA: Diagnosis not present

## 2022-08-22 DIAGNOSIS — Z510 Encounter for antineoplastic radiation therapy: Secondary | ICD-10-CM | POA: Diagnosis not present

## 2022-08-22 DIAGNOSIS — C50412 Malignant neoplasm of upper-outer quadrant of left female breast: Secondary | ICD-10-CM | POA: Diagnosis not present

## 2022-08-22 DIAGNOSIS — C50512 Malignant neoplasm of lower-outer quadrant of left female breast: Secondary | ICD-10-CM | POA: Diagnosis not present

## 2022-08-22 DIAGNOSIS — Z171 Estrogen receptor negative status [ER-]: Secondary | ICD-10-CM | POA: Diagnosis not present

## 2022-08-23 DIAGNOSIS — C50512 Malignant neoplasm of lower-outer quadrant of left female breast: Secondary | ICD-10-CM | POA: Diagnosis not present

## 2022-08-23 DIAGNOSIS — Z510 Encounter for antineoplastic radiation therapy: Secondary | ICD-10-CM | POA: Diagnosis not present

## 2022-08-23 DIAGNOSIS — Z171 Estrogen receptor negative status [ER-]: Secondary | ICD-10-CM | POA: Diagnosis not present

## 2022-08-23 DIAGNOSIS — C50412 Malignant neoplasm of upper-outer quadrant of left female breast: Secondary | ICD-10-CM | POA: Diagnosis not present

## 2022-08-24 DIAGNOSIS — C50412 Malignant neoplasm of upper-outer quadrant of left female breast: Secondary | ICD-10-CM | POA: Diagnosis not present

## 2022-08-24 DIAGNOSIS — Z510 Encounter for antineoplastic radiation therapy: Secondary | ICD-10-CM | POA: Diagnosis not present

## 2022-08-24 DIAGNOSIS — C50512 Malignant neoplasm of lower-outer quadrant of left female breast: Secondary | ICD-10-CM | POA: Diagnosis not present

## 2022-08-24 DIAGNOSIS — Z171 Estrogen receptor negative status [ER-]: Secondary | ICD-10-CM | POA: Diagnosis not present

## 2022-08-25 DIAGNOSIS — C50412 Malignant neoplasm of upper-outer quadrant of left female breast: Secondary | ICD-10-CM | POA: Diagnosis not present

## 2022-08-25 DIAGNOSIS — Z510 Encounter for antineoplastic radiation therapy: Secondary | ICD-10-CM | POA: Diagnosis not present

## 2022-08-25 DIAGNOSIS — C50512 Malignant neoplasm of lower-outer quadrant of left female breast: Secondary | ICD-10-CM | POA: Diagnosis not present

## 2022-08-25 DIAGNOSIS — Z171 Estrogen receptor negative status [ER-]: Secondary | ICD-10-CM | POA: Diagnosis not present

## 2022-08-26 DIAGNOSIS — Z510 Encounter for antineoplastic radiation therapy: Secondary | ICD-10-CM | POA: Diagnosis not present

## 2022-08-26 DIAGNOSIS — C50512 Malignant neoplasm of lower-outer quadrant of left female breast: Secondary | ICD-10-CM | POA: Diagnosis not present

## 2022-08-26 DIAGNOSIS — Z171 Estrogen receptor negative status [ER-]: Secondary | ICD-10-CM | POA: Diagnosis not present

## 2022-08-26 DIAGNOSIS — C50412 Malignant neoplasm of upper-outer quadrant of left female breast: Secondary | ICD-10-CM | POA: Diagnosis not present

## 2022-08-27 IMAGING — MG MM DIGITAL SCREENING BILAT W/ TOMO AND CAD
8 series · 8 of 24 positions shown · non-contrast
Comparison: Previous exam(s).

CLINICAL DATA: Screening.

EXAM:
DIGITAL SCREENING BILATERAL MAMMOGRAM WITH TOMOSYNTHESIS AND CAD
TECHNIQUE: Bilateral screening digital craniocaudal and mediolateral oblique
mammograms were obtained. Bilateral screening digital breast
tomosynthesis was performed. The images were evaluated with
computer-aided detection.

[R CC synth-2D]
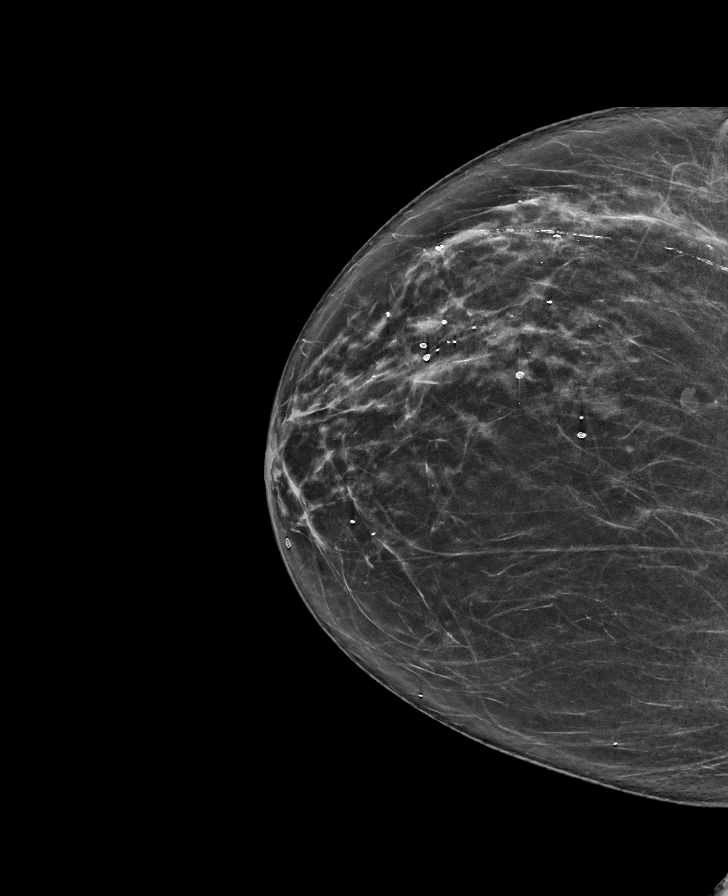

[L MLO synth-2D]
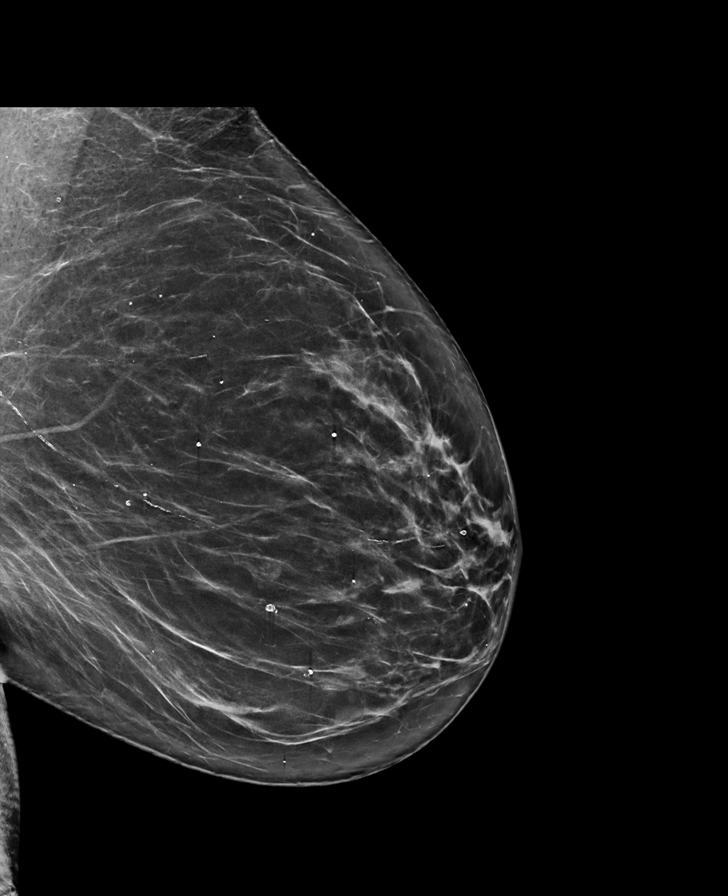

[L CC synth-2D]
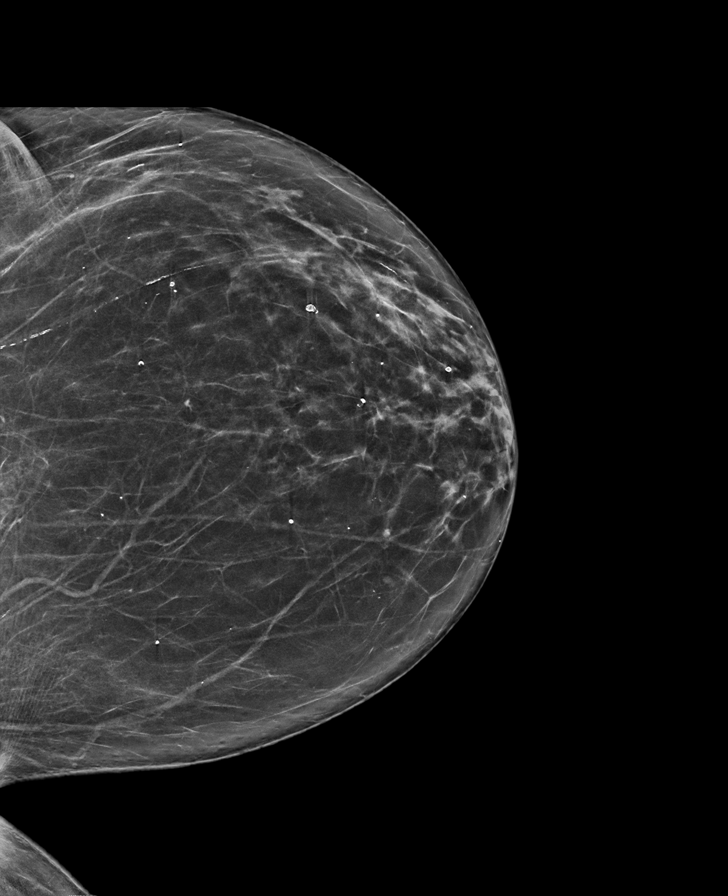

[R MLO synth-2D]
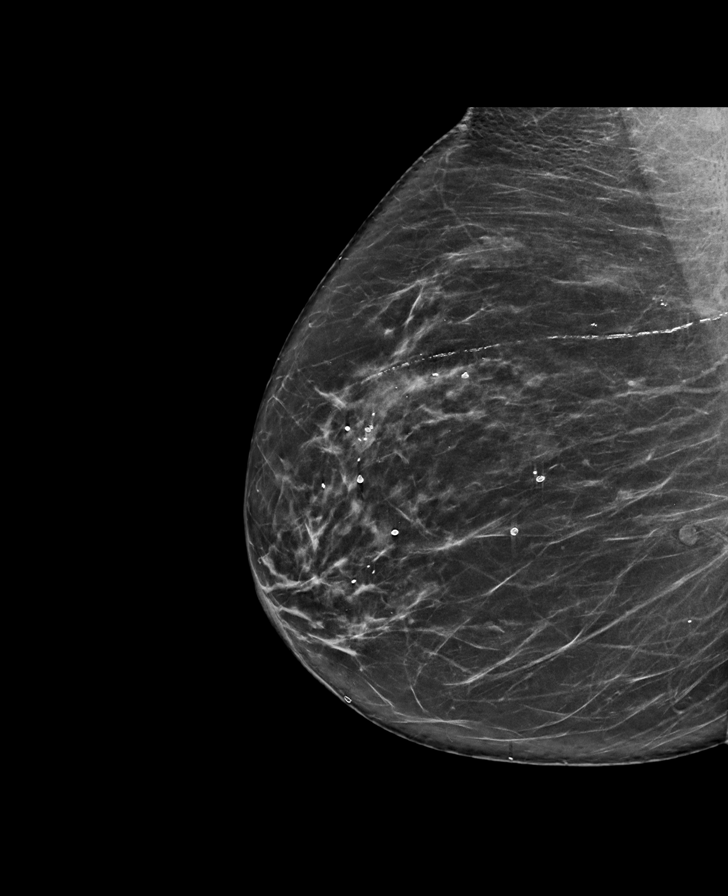

[R CC tomo · tomo slice 35/70.0]
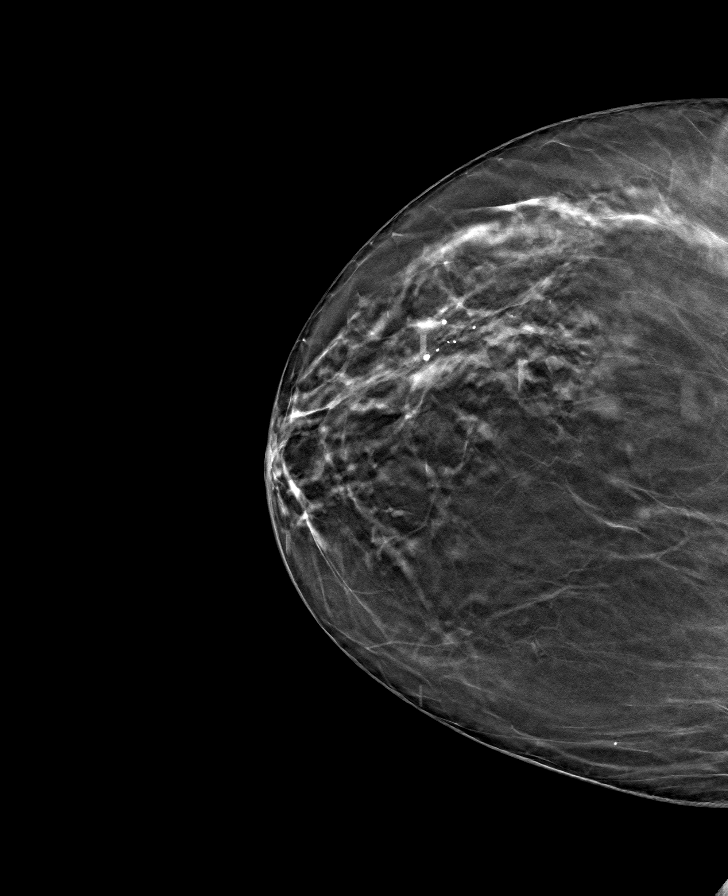

[L MLO tomo · tomo slice 40/79.0]
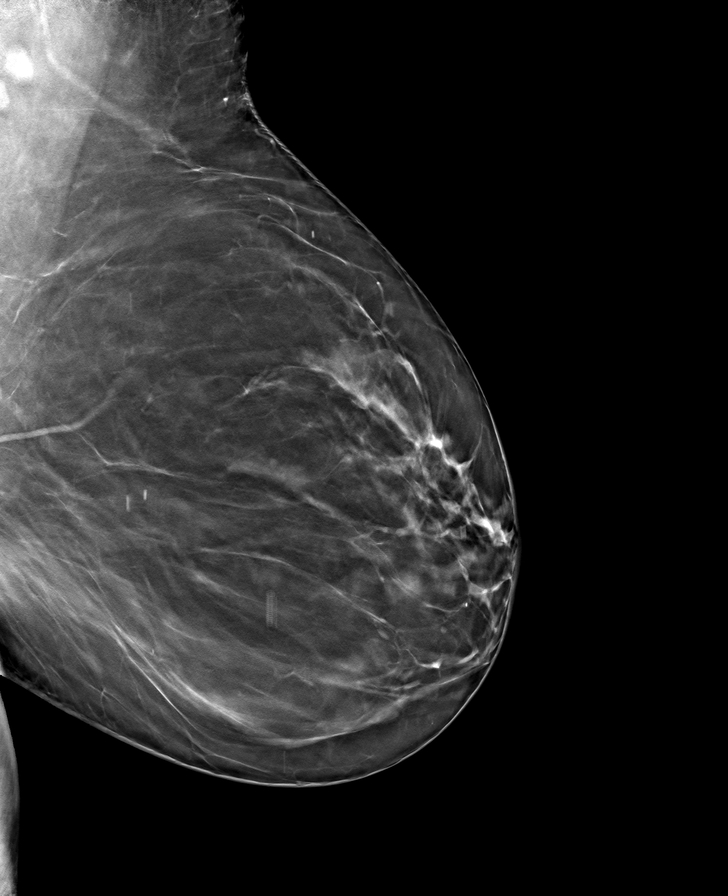

[L CC tomo · tomo slice 37/73.0]
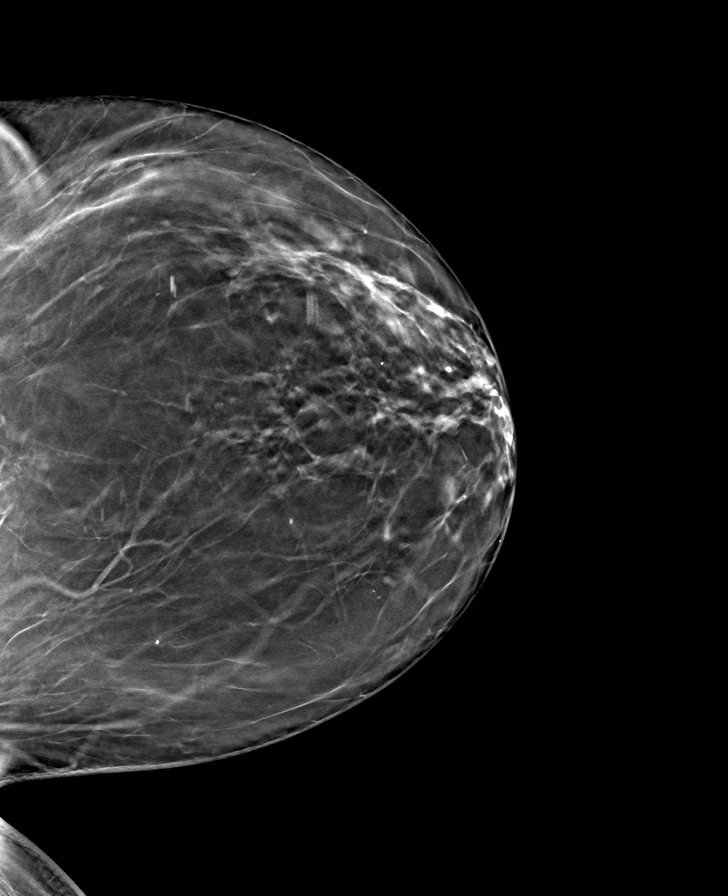

[R MLO tomo · tomo slice 39/77.0]
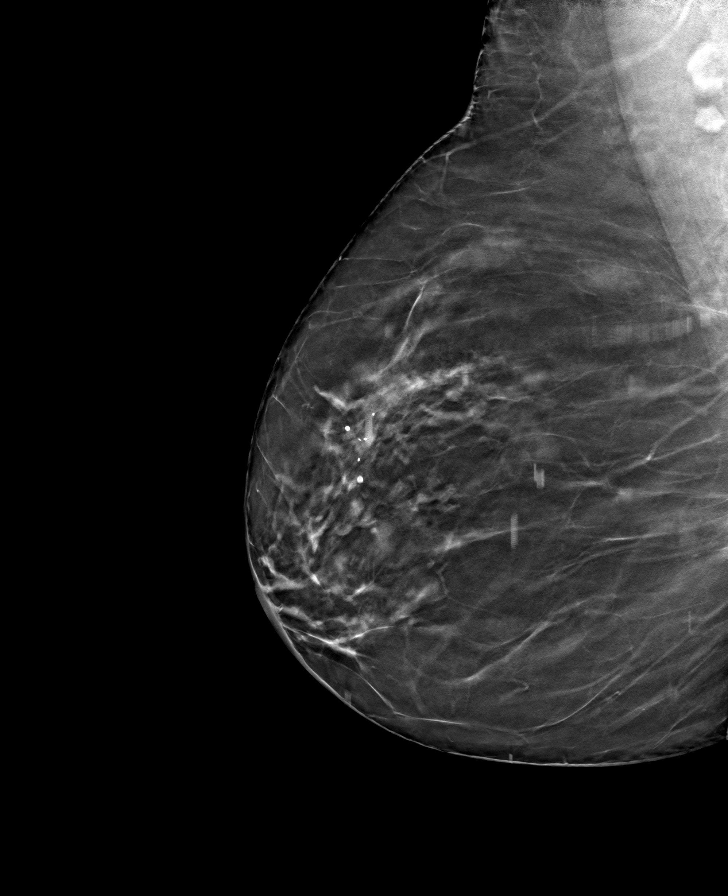

[8 of 24 positions shown; findings below may reference images not displayed]

ACR Breast Density Category b: There are scattered areas of
fibroglandular density.
FINDINGS: There are no findings suspicious for malignancy.
IMPRESSION: No mammographic evidence of malignancy. A result letter of this
screening mammogram will be mailed directly to the patient.

RECOMMENDATION:
Screening mammogram in one year. (Code:51-O-LD2)

BI-RADS CATEGORY  1: Negative.

## 2022-08-28 DIAGNOSIS — Z171 Estrogen receptor negative status [ER-]: Secondary | ICD-10-CM | POA: Diagnosis not present

## 2022-08-28 DIAGNOSIS — Z510 Encounter for antineoplastic radiation therapy: Secondary | ICD-10-CM | POA: Diagnosis not present

## 2022-08-28 DIAGNOSIS — C50512 Malignant neoplasm of lower-outer quadrant of left female breast: Secondary | ICD-10-CM | POA: Diagnosis not present

## 2022-08-29 DIAGNOSIS — Z510 Encounter for antineoplastic radiation therapy: Secondary | ICD-10-CM | POA: Diagnosis not present

## 2022-08-29 DIAGNOSIS — C50512 Malignant neoplasm of lower-outer quadrant of left female breast: Secondary | ICD-10-CM | POA: Diagnosis not present

## 2022-08-29 DIAGNOSIS — C50412 Malignant neoplasm of upper-outer quadrant of left female breast: Secondary | ICD-10-CM | POA: Diagnosis not present

## 2022-08-29 DIAGNOSIS — Z171 Estrogen receptor negative status [ER-]: Secondary | ICD-10-CM | POA: Diagnosis not present

## 2022-08-30 DIAGNOSIS — Z171 Estrogen receptor negative status [ER-]: Secondary | ICD-10-CM | POA: Diagnosis not present

## 2022-08-30 DIAGNOSIS — Z510 Encounter for antineoplastic radiation therapy: Secondary | ICD-10-CM | POA: Diagnosis not present

## 2022-08-30 DIAGNOSIS — C50412 Malignant neoplasm of upper-outer quadrant of left female breast: Secondary | ICD-10-CM | POA: Diagnosis not present

## 2022-08-30 DIAGNOSIS — C50512 Malignant neoplasm of lower-outer quadrant of left female breast: Secondary | ICD-10-CM | POA: Diagnosis not present

## 2022-08-31 DIAGNOSIS — Z171 Estrogen receptor negative status [ER-]: Secondary | ICD-10-CM | POA: Diagnosis not present

## 2022-08-31 DIAGNOSIS — C50412 Malignant neoplasm of upper-outer quadrant of left female breast: Secondary | ICD-10-CM | POA: Diagnosis not present

## 2022-08-31 DIAGNOSIS — Z510 Encounter for antineoplastic radiation therapy: Secondary | ICD-10-CM | POA: Diagnosis not present

## 2022-08-31 DIAGNOSIS — C50512 Malignant neoplasm of lower-outer quadrant of left female breast: Secondary | ICD-10-CM | POA: Diagnosis not present

## 2022-09-01 DIAGNOSIS — Z171 Estrogen receptor negative status [ER-]: Secondary | ICD-10-CM | POA: Diagnosis not present

## 2022-09-01 DIAGNOSIS — C50512 Malignant neoplasm of lower-outer quadrant of left female breast: Secondary | ICD-10-CM | POA: Diagnosis not present

## 2022-09-01 DIAGNOSIS — Z510 Encounter for antineoplastic radiation therapy: Secondary | ICD-10-CM | POA: Diagnosis not present

## 2022-09-01 DIAGNOSIS — C50412 Malignant neoplasm of upper-outer quadrant of left female breast: Secondary | ICD-10-CM | POA: Diagnosis not present

## 2022-09-02 DIAGNOSIS — C50412 Malignant neoplasm of upper-outer quadrant of left female breast: Secondary | ICD-10-CM | POA: Diagnosis not present

## 2022-09-02 DIAGNOSIS — Z510 Encounter for antineoplastic radiation therapy: Secondary | ICD-10-CM | POA: Diagnosis not present

## 2022-09-02 DIAGNOSIS — Z171 Estrogen receptor negative status [ER-]: Secondary | ICD-10-CM | POA: Diagnosis not present

## 2022-09-02 DIAGNOSIS — C50512 Malignant neoplasm of lower-outer quadrant of left female breast: Secondary | ICD-10-CM | POA: Diagnosis not present

## 2022-09-05 DIAGNOSIS — C50412 Malignant neoplasm of upper-outer quadrant of left female breast: Secondary | ICD-10-CM | POA: Diagnosis not present

## 2022-09-05 DIAGNOSIS — Z171 Estrogen receptor negative status [ER-]: Secondary | ICD-10-CM | POA: Diagnosis not present

## 2022-09-05 DIAGNOSIS — Z510 Encounter for antineoplastic radiation therapy: Secondary | ICD-10-CM | POA: Diagnosis not present

## 2022-09-05 DIAGNOSIS — C50512 Malignant neoplasm of lower-outer quadrant of left female breast: Secondary | ICD-10-CM | POA: Diagnosis not present

## 2022-09-06 DIAGNOSIS — C50412 Malignant neoplasm of upper-outer quadrant of left female breast: Secondary | ICD-10-CM | POA: Diagnosis not present

## 2022-09-06 DIAGNOSIS — Z510 Encounter for antineoplastic radiation therapy: Secondary | ICD-10-CM | POA: Diagnosis not present

## 2022-09-07 DIAGNOSIS — Z171 Estrogen receptor negative status [ER-]: Secondary | ICD-10-CM | POA: Diagnosis not present

## 2022-09-07 DIAGNOSIS — C50412 Malignant neoplasm of upper-outer quadrant of left female breast: Secondary | ICD-10-CM | POA: Diagnosis not present

## 2022-09-07 DIAGNOSIS — Z510 Encounter for antineoplastic radiation therapy: Secondary | ICD-10-CM | POA: Diagnosis not present

## 2022-09-07 DIAGNOSIS — C50512 Malignant neoplasm of lower-outer quadrant of left female breast: Secondary | ICD-10-CM | POA: Diagnosis not present

## 2022-09-08 DIAGNOSIS — C50412 Malignant neoplasm of upper-outer quadrant of left female breast: Secondary | ICD-10-CM | POA: Diagnosis not present

## 2022-09-08 DIAGNOSIS — Z510 Encounter for antineoplastic radiation therapy: Secondary | ICD-10-CM | POA: Diagnosis not present

## 2022-09-09 DIAGNOSIS — C50412 Malignant neoplasm of upper-outer quadrant of left female breast: Secondary | ICD-10-CM | POA: Diagnosis not present

## 2022-09-09 DIAGNOSIS — Z510 Encounter for antineoplastic radiation therapy: Secondary | ICD-10-CM | POA: Diagnosis not present

## 2022-09-13 DIAGNOSIS — C50412 Malignant neoplasm of upper-outer quadrant of left female breast: Secondary | ICD-10-CM | POA: Diagnosis not present

## 2022-09-13 DIAGNOSIS — Z510 Encounter for antineoplastic radiation therapy: Secondary | ICD-10-CM | POA: Diagnosis not present

## 2022-09-13 DIAGNOSIS — Z171 Estrogen receptor negative status [ER-]: Secondary | ICD-10-CM | POA: Diagnosis not present

## 2022-09-13 DIAGNOSIS — C50512 Malignant neoplasm of lower-outer quadrant of left female breast: Secondary | ICD-10-CM | POA: Diagnosis not present

## 2022-09-16 ENCOUNTER — Other Ambulatory Visit: Payer: Self-pay | Admitting: Family Medicine

## 2022-09-16 DIAGNOSIS — E785 Hyperlipidemia, unspecified: Secondary | ICD-10-CM

## 2022-09-27 DIAGNOSIS — H401131 Primary open-angle glaucoma, bilateral, mild stage: Secondary | ICD-10-CM | POA: Diagnosis not present

## 2022-09-27 DIAGNOSIS — Z961 Presence of intraocular lens: Secondary | ICD-10-CM | POA: Diagnosis not present

## 2022-09-29 ENCOUNTER — Ambulatory Visit: Payer: Medicare HMO | Admitting: Family Medicine

## 2022-10-13 ENCOUNTER — Other Ambulatory Visit: Payer: Self-pay | Admitting: Family Medicine

## 2022-10-13 DIAGNOSIS — J302 Other seasonal allergic rhinitis: Secondary | ICD-10-CM

## 2022-10-13 DIAGNOSIS — K219 Gastro-esophageal reflux disease without esophagitis: Secondary | ICD-10-CM

## 2022-10-14 DIAGNOSIS — Z171 Estrogen receptor negative status [ER-]: Secondary | ICD-10-CM | POA: Diagnosis not present

## 2022-10-14 DIAGNOSIS — Z5181 Encounter for therapeutic drug level monitoring: Secondary | ICD-10-CM | POA: Diagnosis not present

## 2022-10-14 DIAGNOSIS — Z17421 Hormone receptor negative with human epidermal growth factor receptor 2 negative status: Secondary | ICD-10-CM | POA: Diagnosis not present

## 2022-10-14 DIAGNOSIS — C50912 Malignant neoplasm of unspecified site of left female breast: Secondary | ICD-10-CM | POA: Diagnosis not present

## 2022-10-14 DIAGNOSIS — Z1231 Encounter for screening mammogram for malignant neoplasm of breast: Secondary | ICD-10-CM | POA: Diagnosis not present

## 2022-10-14 DIAGNOSIS — Z7983 Long term (current) use of bisphosphonates: Secondary | ICD-10-CM | POA: Diagnosis not present

## 2022-10-24 NOTE — Progress Notes (Unsigned)
Name: Theresa Hall   MRN: [id number]    DOB: August 01, 1940   Date:10/25/2022       Progress Note  Subjective  Chief Complaint  Follow Up  HPI  HTN associated with CKI stage III/secondary hyperparathyroidism   She is on low dose of Avapro no chest pain palpitation or dizziness.  Denies pruritus or change in urinary frequency. She does not take NSAID's    Hypothyroidism: TSH was at goal last visit  She denies dry skin, hair loss, change in bowel movement or dysphagia.  We will recheck next visit    Migraine headaches: no episodes in over 14 years, last migraine when her father died,  she finally came off Topamax last year and no recurrence Doing well   Hyperlipidemia: taking medication and denies side effects, reviewed labs done LDL went up from 88 to 93  We will repeat labs today .    Osteopenia: discussed results, discussed high calcium and vitamin D diet and she has been taking low dose fosamax without side effects. Reviewed bone density done 02/2021 and showed mild improvement on femur, and slightly worse on wrist from -2.3 to -2.4 , she is taking 70 mg dose but needs to stop before she started Zoledronic acid in January    GERD/Hiatal hernia: she states symptoms are intermittent, she eats dinner around 7 pm, smaller portions , she states rarely has symptoms now, she is taking Pepcid at night prn and doing well   Fatty liver: found on CT abdomen and pelvis. On statin therapy and healthy diet . Last liver enzymes at goal   Breast cancer : she was diagnosed Spring of 2024, she has lumpectomy and will start Zoledronic acid in January to decrease breast cancer recurrent. She had radiation therapy done at [facility]   Patient Active Problem List   Diagnosis Date Noted   Invasive carcinoma of breast (HCC) 05/12/2022   Goals of care, counseling/discussion 05/12/2022   Perennial allergic rhinitis with seasonal variation 03/29/2022   GERD without esophagitis 03/29/2022   Hypertension, benign  03/29/2022   Vitamin D deficiency 03/29/2022   Benign hypertension with chronic kidney disease, stage III (HCC) 03/29/2022   Fatty liver 03/29/2022   Benign hypertensive kidney disease with chronic kidney disease 07/22/2019   BCC (basal cell carcinoma of skin) 10/16/2017   Secondary hyperparathyroidism of renal origin (HCC) 07/18/2017   Osteopenia after menopause 08/15/2016   Reflux esophagitis 06/23/2014   Dyslipidemia 06/23/2014   Palpitations 06/23/2014   Chronic kidney disease (CKD), stage III (moderate) (HCC) 08/24/2009   History of diverticulitis 05/14/2008   Adult hypothyroidism 12/04/2006   Migraine without aura and without status migrainosus, not intractable 08/02/2006   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    Past Surgical History:  Procedure Laterality Date   APPENDECTOMY     AXILLARY LYMPH NODE DISSECTION Left 05/27/2022   Procedure: AXILLARY LYMPH NODE DISSECTION w/ SN;  Surgeon: Sung Amabile, DO;  Location: [facility];  Service: General;  Laterality: Left;   BASAL CELL CARCINOMA EXCISION     BASAL CELL CARCINOMA EXCISION  2014   nasal   BREAST BIOPSY Left 04/08/2022   US biopsy 2 areas/ coil clip 4 oc, 4:30 venus clip/ path pending   BREAST BIOPSY Left 04/08/2022   [place] LT BREAST BX W LOC DEV 1ST LESION IMG BX SPEC US GUIDE 04/08/2022 [facility]   BREAST BIOPSY Left 04/08/2022   [place] LT BREAST BX W LOC DEV EA ADD LESION IMG BX SPEC US GUIDE 04/08/2022  [facility]   BREAST BIOPSY  04/27/2022   MM LT RADIO FREQUENCY TAG EA ADD LESION LOC MAMMO GUIDE 04/27/2022 [facility]   CATARACT EXTRACTION W/PHACO Left 07/03/2019   Procedure: CATARACT EXTRACTION PHACO AND INTRAOCULAR LENS PLACEMENT (IOC) LEFT KAHOOK DUAL BLADE GONIOTOMY 9.84 01:14.5 13.2%;  Surgeon: Lockie Mola, MD;  Location: [facility];  Service: Ophthalmology;  Laterality: Left;   CATARACT EXTRACTION W/PHACO Right 08/07/2019   Procedure: CATARACT EXTRACTION PHACO AND INTRAOCULAR LENS  PLACEMENT (IOC) RIGHT KAHOOK DUAL BLADE GONIOTOMY;  Surgeon: Lockie Mola, MD;  Location: [facility];  Service: Ophthalmology;  Laterality: Right;  6.46 0:59.4 10.9%   CHOLECYSTECTOMY     COLONOSCOPY     ORIF PATELLA Right 08/15/2015   Procedure: OPEN REDUCTION INTERNAL (ORIF) FIXATION PATELLA  and application wound vac;  Surgeon: Christena Flake, MD;  Location: [facility];  Service: Orthopedics;  Laterality: Right;   TONSILLECTOMY     VAGINAL HYSTERECTOMY      Family History  Problem Relation Age of Onset   Heart attack Father        x2   Heart disease Sister    Kidney disease Sister    Diabetes Sister    Heart attack Brother    Diabetes Brother    Heart attack Brother    Breast cancer Neg Hx     Social History   Tobacco Use   Smoking status: Never   Smokeless tobacco: Never   Tobacco comments:    smoking cessation materials not required  Substance Use Topics   Alcohol use: No     Current Outpatient Medications:    acetaminophen (TYLENOL) 500 MG tablet, Take 500 mg by mouth daily as needed., Disp: , Rfl:    cholecalciferol (VITAMIN D) 1000 units tablet, Take 1 capsule by mouth once a week., Disp: , Rfl:    ipratropium (ATROVENT) 0.06 % nasal spray, PLACE 2 SPRAYS INTO BOTH NOSTRILS 4 TIMES DAILY., Disp: 100 mL, Rfl: 1   Multiple Vitamin (MULTIVITAMIN) tablet, Take 1 tablet by mouth daily., Disp: , Rfl:    pravastatin (PRAVACHOL) 80 MG tablet, Take 1 tablet (80 mg total) by mouth every morning., Disp: 90 tablet, Rfl: 1   timolol (TIMOPTIC) 0.5 % ophthalmic solution, Place 1 drop into both eyes 2 (two) times daily., Disp: , Rfl:    [START ON 01/13/2023] Zoledronic Acid (ZOMETA) 4 MG/100ML IVPB, Inject 4 mg into the vein every 6 (six) months., Disp: , Rfl:    famotidine (PEPCID) 40 MG tablet, Take 1 tablet (40 mg total) by mouth daily., Disp: 90 tablet, Rfl: 1   irbesartan (AVAPRO) 75 MG tablet, Take 1 tablet (75 mg total) by mouth every evening., Disp: 90 tablet,  Rfl: 1   SYNTHROID 75 MCG tablet, Take 1 tablet (75 mcg total) by mouth daily before breakfast. Brand name only, Disp: 90 tablet, Rfl: 1  Allergies  Allergen Reactions   Codeine Other (See Comments)    Patient had insomnia and nausea   Oxycodone Other (See Comments)    Made her "Loopy" after surgery     I personally reviewed active problem list, medication list, allergies, family history, social history, health maintenance with the patient/caregiver today.   ROS  Ten systems reviewed and is negative except as mentioned in HPI    Objective  Vitals:   10/25/22 0911  BP: 132/72  Pulse: 70  Resp: 14  Temp: 97.9 F (36.6 C)  TempSrc: Oral  SpO2: 99%  Weight: 153 lb 9.6  oz (69.7 kg)  Height: 5\' 4"  (1.626 m)    Body mass index is 26.37 kg/m.  Physical Exam  Constitutional: Patient appears well-developed and well-nourished. No distress.  HEENT: head atraumatic, normocephalic, pupils equal and reactive to light, neck supple Cardiovascular: Normal rate, regular rhythm and normal heart sounds.  No murmur heard. No BLE edema. Pulmonary/Chest: Effort normal and breath sounds normal. No respiratory distress. Abdominal: Soft.  There is no tenderness. Psychiatric: Patient has a normal mood and affect. behavior is normal. Judgment and thought content normal.   PHQ2/9:    10/25/2022    9:15 AM 03/29/2022    9:34 AM 09/28/2021   10:58 AM 09/23/2021    1:58 PM 02/25/2021   10:00 AM  Depression screen PHQ 2/9  Decreased Interest 0 0 0 0 0  Down, Depressed, Hopeless 0 0 0 0 0  PHQ - 2 Score 0 0 0 0 0  Altered sleeping 0 0 0 0 0  Tired, decreased energy 0 0 0 0 0  Change in appetite 0 0 0 0 0  Feeling bad or failure about yourself  0 0 0 0 0  Trouble concentrating 0 0 0 0 0  Moving slowly or fidgety/restless 0 0 0 0 0  Suicidal thoughts 0 0 0 0 0  PHQ-9 Score 0 0 0 0 0  Difficult doing work/chores    Not difficult at all     phq 9 is negative   Fall Risk:    10/25/2022     9:15 AM 03/29/2022    9:34 AM 09/28/2021   10:58 AM 09/23/2021    1:58 PM 02/25/2021   10:00 AM  Fall Risk   Falls in the past year? 0 0 0 0 0  Number falls in past yr:  0 0 0 0  Injury with Fall?  0 0 0 0  Risk for fall due to : No Fall Risks No Fall Risks No Fall Risks  No Fall Risks  Follow up Falls prevention discussed Falls prevention discussed Falls prevention discussed  Falls prevention discussed      Functional Status Survey: Is the patient deaf or have difficulty hearing?: No Does the patient have difficulty seeing, even when wearing glasses/contacts?: No Does the patient have difficulty concentrating, remembering, or making decisions?: No Does the patient have difficulty walking or climbing stairs?: No Does the patient have difficulty dressing or bathing?: No Does the patient have difficulty doing errands alone such as visiting a doctor's office or shopping?: No    Assessment & Plan  1. Benign hypertension with chronic kidney disease, stage III (HCC)  - irbesartan (AVAPRO) 75 MG tablet; Take 1 tablet (75 mg total) by mouth every evening.  Dispense: 90 tablet; Refill: 1  2. Stage 3a chronic kidney disease (HCC)  - irbesartan (AVAPRO) 75 MG tablet; Take 1 tablet (75 mg total) by mouth every evening.  Dispense: 90 tablet; Refill: 1  3. Secondary hyperparathyroidism of renal origin [facility])  Recheck labs yearly   4. Malignant neoplasm of lower-outer quadrant of left breast of female, estrogen receptor negative (HCC)  Keep follow up with oncologist at [facility]  5. Adult hypothyroidism  - SYNTHROID 75 MCG tablet; Take 1 tablet (75 mcg total) by mouth daily before breakfast. Brand name only  Dispense: 90 tablet; Refill: 1  6. GERD without esophagitis  - famotidine (PEPCID) 40 MG tablet; Take 1 tablet (40 mg total) by mouth daily.  Dispense: 90 tablet; Refill: 1  7. Need  for immunization against influenza  - Flu Vaccine Trivalent High Dose (Fluad)  8. Osteopenia after  menopause  Currently on alendronate   9. Dyslipidemia  On statin therapy   10. Hypertension, benign  - irbesartan (AVAPRO) 75 MG tablet; Take 1 tablet (75 mg total) by mouth every evening.  Dispense: 90 tablet; Refill: 1  11. Need for pneumococcal 20-valent conjugate vaccination  - Pneumococcal conjugate vaccine 20-valent (Prevnar 20)

## 2022-10-25 ENCOUNTER — Ambulatory Visit: Payer: Medicare HMO | Admitting: Family Medicine

## 2022-10-25 ENCOUNTER — Encounter: Payer: Self-pay | Admitting: Family Medicine

## 2022-10-25 VITALS — BP 132/72 | HR 70 | Temp 97.9°F | Resp 14 | Ht 64.0 in | Wt 153.6 lb

## 2022-10-25 DIAGNOSIS — N2581 Secondary hyperparathyroidism of renal origin: Secondary | ICD-10-CM

## 2022-10-25 DIAGNOSIS — E039 Hypothyroidism, unspecified: Secondary | ICD-10-CM | POA: Diagnosis not present

## 2022-10-25 DIAGNOSIS — Z780 Asymptomatic menopausal state: Secondary | ICD-10-CM

## 2022-10-25 DIAGNOSIS — E785 Hyperlipidemia, unspecified: Secondary | ICD-10-CM

## 2022-10-25 DIAGNOSIS — M8580 Other specified disorders of bone density and structure, unspecified site: Secondary | ICD-10-CM | POA: Diagnosis not present

## 2022-10-25 DIAGNOSIS — Z171 Estrogen receptor negative status [ER-]: Secondary | ICD-10-CM

## 2022-10-25 DIAGNOSIS — C50512 Malignant neoplasm of lower-outer quadrant of left female breast: Secondary | ICD-10-CM

## 2022-10-25 DIAGNOSIS — I10 Essential (primary) hypertension: Secondary | ICD-10-CM

## 2022-10-25 DIAGNOSIS — N1831 Chronic kidney disease, stage 3a: Secondary | ICD-10-CM

## 2022-10-25 DIAGNOSIS — K219 Gastro-esophageal reflux disease without esophagitis: Secondary | ICD-10-CM | POA: Diagnosis not present

## 2022-10-25 DIAGNOSIS — N183 Chronic kidney disease, stage 3 unspecified: Secondary | ICD-10-CM | POA: Diagnosis not present

## 2022-10-25 DIAGNOSIS — I129 Hypertensive chronic kidney disease with stage 1 through stage 4 chronic kidney disease, or unspecified chronic kidney disease: Secondary | ICD-10-CM

## 2022-10-25 DIAGNOSIS — Z23 Encounter for immunization: Secondary | ICD-10-CM | POA: Diagnosis not present

## 2022-10-25 MED ORDER — IRBESARTAN 75 MG PO TABS: 75.0000 mg | ORAL_TABLET | Freq: Every evening | ORAL | 1 refills | Status: DC

## 2022-10-25 MED ORDER — SYNTHROID 75 MCG PO TABS: 75.0000 ug | ORAL_TABLET | Freq: Every day | ORAL | 1 refills | Status: DC

## 2022-10-25 MED ORDER — FAMOTIDINE 40 MG PO TABS: 40.0000 mg | ORAL_TABLET | Freq: Every day | ORAL | 1 refills | Status: DC

## 2022-10-25 NOTE — Patient Instructions (Signed)
Get Tdap, Shingrix and COVID at local pharmacy

## 2022-11-04 ENCOUNTER — Ambulatory Visit (INDEPENDENT_AMBULATORY_CARE_PROVIDER_SITE_OTHER): Payer: Medicare HMO

## 2022-11-04 VITALS — Ht 64.0 in | Wt 151.0 lb

## 2022-11-04 DIAGNOSIS — Z0000 Encounter for general adult medical examination without abnormal findings: Secondary | ICD-10-CM

## 2022-11-04 NOTE — Progress Notes (Signed)
Subjective:   Theresa Hall is a 82 y.o. female who presents for Medicare Annual (Subsequent) preventive examination.  Visit Complete: Virtual I connected with  Theresa Hall on 11/04/22 by a audio enabled telemedicine application and verified that I am speaking with the correct person using two identifiers.  Patient Location: Home  Provider Location: Home Office  I discussed the limitations of evaluation and management by telemedicine. The patient expressed understanding and agreed to proceed.  Vital Signs: Because this visit was a virtual/telehealth visit, some criteria may be missing or patient reported. Any vitals not documented were not able to be obtained and vitals that have been documented are patient reported.  Patient Medicare AWV questionnaire was completed by the patient on (not done); I have confirmed that all information answered by patient is correct and no changes since this date.  Cardiac Risk Factors include: advanced age (>60men, >50 women);dyslipidemia;hypertension     Objective:    Today's Vitals   11/04/22 1052  Weight: 151 lb (68.5 kg)  Height: 5\' 4"  (1.626 m)   Body mass index is 25.92 kg/m.     11/04/2022   11:16 AM 05/27/2022    8:54 AM 05/12/2022    9:49 AM 05/06/2022   10:39 AM 05/02/2022    2:54 PM 08/11/2020    4:20 PM 08/07/2019   10:43 AM  Advanced Directives  Does Patient Have a Medical Advance Directive? Yes Yes Yes Yes Yes Yes Yes  Type of Advance Directive Living will;Healthcare Power of [employer] Power of [place];Living will Healthcare Power of [place];Living will Healthcare Power of [place];Living will Healthcare Power of [place];Living will Healthcare Power of [place];Living will Healthcare Power of [place];Living will  Does patient want to make changes to medical advance directive?    No - Patient declined No - Patient declined  No - Patient declined  Copy of Healthcare Power of Attorney in Chart? No - copy requested  No  - copy requested No - copy requested No - copy requested No - copy requested No - copy requested    Current Medications (verified) Outpatient Encounter Medications as of 11/04/2022  Medication Sig   acetaminophen (TYLENOL) 500 MG tablet Take 500 mg by mouth daily as needed.   cholecalciferol (VITAMIN D) 1000 units tablet Take 1 capsule by mouth once a week.   famotidine (PEPCID) 40 MG tablet Take 1 tablet (40 mg total) by mouth daily.   ipratropium (ATROVENT) 0.06 % nasal spray PLACE 2 SPRAYS INTO BOTH NOSTRILS 4 TIMES DAILY.   irbesartan (AVAPRO) 75 MG tablet Take 1 tablet (75 mg total) by mouth every evening.   pravastatin (PRAVACHOL) 80 MG tablet Take 1 tablet (80 mg total) by mouth every morning.   SYNTHROID 75 MCG tablet Take 1 tablet (75 mcg total) by mouth daily before breakfast. Brand name only   timolol (TIMOPTIC) 0.5 % ophthalmic solution Place 1 drop into both eyes 2 (two) times daily.   [START ON 01/13/2023] Zoledronic Acid (ZOMETA) 4 MG/100ML IVPB Inject 4 mg into the vein every 6 (six) months.   Multiple Vitamin (MULTIVITAMIN) tablet Take 1 tablet by mouth daily. (Patient not taking: Reported on 11/04/2022)   No facility-administered encounter medications on file as of 11/04/2022.    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   Cancer (HCC) 2006   melanoma on shoulder    Chronic kidney disease, stage III (moderate) (HCC)    Diverticulitis    Diverticulitis of colon (without mention of hemorrhage)(562.11)    GERD (gastroesophageal reflux disease)    Glaucoma    Hypertension    Invasive carcinoma of breast (HCC)    Migraine without aura, without mention of intractable migraine without mention of status migrainosus    Osteoarthritis    Osteopenia    Other  abnormal glucose    Other and unspecified hyperlipidemia    Palpitations    PONV (postoperative nausea and vomiting)    has used scopolamine patches   Postablative ovarian failure    Pure hypercholesterolemia    Secondary hyperparathyroidism of renal origin (HCC)    Unspecified glaucoma(365.9)    Unspecified hypothyroidism    Vertigo    none for over 2 yrs   Past Surgical History:  Procedure Laterality Date   APPENDECTOMY     AXILLARY LYMPH NODE DISSECTION Left 05/27/2022   Procedure: AXILLARY LYMPH NODE DISSECTION w/ SN;  Surgeon: Sung Amabile, DO;  Location: [facility];  Service: General;  Laterality: Left;   BASAL CELL CARCINOMA EXCISION     BASAL CELL CARCINOMA EXCISION  2014   nasal   BREAST BIOPSY Left 04/08/2022   US biopsy 2 areas/ coil clip 4 oc, 4:30 venus clip/ path pending   BREAST BIOPSY Left 04/08/2022   [place] LT BREAST BX W LOC DEV 1ST LESION IMG BX SPEC US GUIDE 04/08/2022 [facility]   BREAST BIOPSY Left 04/08/2022   [place] LT BREAST BX W LOC DEV EA ADD LESION IMG BX SPEC US GUIDE 04/08/2022 [facility]   BREAST BIOPSY  04/27/2022   MM LT RADIO FREQUENCY TAG EA ADD LESION LOC MAMMO GUIDE 04/27/2022 [facility]   CATARACT EXTRACTION W/PHACO Left 07/03/2019   Procedure: CATARACT EXTRACTION PHACO AND INTRAOCULAR LENS PLACEMENT (IOC) LEFT KAHOOK DUAL BLADE GONIOTOMY 9.84 01:14.5 13.2%;  Surgeon: Lockie Mola, MD;  Location: [facility];  Service: Ophthalmology;  Laterality: Left;   CATARACT EXTRACTION W/PHACO Right 08/07/2019   Procedure: CATARACT EXTRACTION PHACO AND INTRAOCULAR LENS PLACEMENT (IOC) RIGHT KAHOOK DUAL BLADE GONIOTOMY;  Surgeon: Lockie Mola, MD;  Location: [facility];  Service: Ophthalmology;  Laterality: Right;  6.46 0:59.4 10.9%   CHOLECYSTECTOMY     COLONOSCOPY     ORIF PATELLA Right 08/15/2015   Procedure: OPEN REDUCTION INTERNAL (ORIF) FIXATION PATELLA  and application wound vac;  Surgeon: Christena Flake, MD;   Location: [facility];  Service: Orthopedics;  Laterality: Right;   TONSILLECTOMY     VAGINAL HYSTERECTOMY     Family History  Problem Relation Age of Onset   Heart attack Father        x2   Heart disease Sister    Kidney disease Sister    Diabetes Sister    Heart attack Brother    Diabetes Brother    Heart attack Brother    Breast cancer Neg Hx    Social History   Socioeconomic History   Marital status: Widowed    Spouse name: Elijah Birk   Number of children: 2   Years of education: some college   Highest education level: 12th grade  Occupational History   Occupation: Retired  Tobacco Use   Smoking status: Never   Smokeless tobacco: Never   Tobacco comments:    smoking cessation materials not required  Vaping Use   Vaping  status: Never Used  Substance and Sexual Activity   Alcohol use: No   Drug use: No   Sexual activity: Not Currently    Birth control/protection: Surgical    Comment: TAH-1989  Other Topics Concern   Not on file  Social History Narrative   She has been a widow since 2009. Pt lives alone.    Social Determinants of Health   Financial Resource Strain: Low Risk  (11/04/2022)   Overall Financial Resource Strain (CARDIA)    Difficulty of Paying Living Expenses: Not hard at all  Food Insecurity: No Food Insecurity (11/04/2022)   Hunger Vital Sign    Worried About Running Out of Food in the Last Year: Never true    Ran Out of Food in the Last Year: Never true  Transportation Needs: No Transportation Needs (11/04/2022)   PRAPARE - Administrator, Civil Service (Medical): No    Lack of Transportation (Non-Medical): No  Physical Activity: Insufficiently Active (11/04/2022)   Exercise Vital Sign    Days of Exercise per Week: 3 days    Minutes of Exercise per Session: 30 min  Stress: No Stress Concern Present (11/04/2022)   [employer] of Occupational Health - Occupational Stress Questionnaire    Feeling of Stress : Not at all  Social  Connections: Moderately Integrated (11/04/2022)   Social Connection and Isolation Panel [NHANES]    Frequency of Communication with Friends and Family: More than three times a week    Frequency of Social Gatherings with Friends and Family: Three times a week    Attends Religious Services: More than 4 times per year    Active Member of Clubs or Organizations: Yes    Attends Banker Meetings: More than 4 times per year    Marital Status: Widowed    Tobacco Counseling Counseling given: Not Answered Tobacco comments: smoking cessation materials not required   Clinical Intake:  Pre-visit preparation completed: Yes  Pain : No/denies pain     BMI - recorded: 25.92 Nutritional Status: BMI 25 -29 Overweight Nutritional Risks: Other (Comment) Diabetes: No  How often do you need to have someone help you when you read instructions, pamphlets, or other written materials from your doctor or pharmacy?: 1 - Never  Interpreter Needed?: No  Comments: lives alone Information entered by :: B.Illianna Paschal,LPN   Activities of Daily Living    11/04/2022   11:16 AM 10/25/2022    9:15 AM  In your present state of health, do you have any difficulty performing the following activities:  Hearing? 0 0  Vision? 0 0  Difficulty concentrating or making decisions? 0 0  Walking or climbing stairs? 0 0  Dressing or bathing? 0 0  Doing errands, shopping? 0 0  Preparing Food and eating ? N   Using the Toilet? N   In the past six months, have you accidently leaked urine? N   Do you have problems with loss of bowel control? N   Managing your Medications? N   Managing your Finances? N   Housekeeping or managing your Housekeeping? N     Patient Care Team: Alba Cory, MD as PCP - General (Family Medicine) Lamont Dowdy, MD as Consulting Physician (Nephrology) Dasher, Cliffton Asters, MD as Consulting Physician (Dermatology) Ria Comment, FNP as Nurse Practitioner (Gynecology) Hulen Luster, RN as Oncology Nurse Navigator  Indicate any recent Medical Services you may have received from other than [facility] providers in the past year (date may be approximate).  Assessment:   This is a routine wellness examination for [place].  Hearing/Vision screen Hearing Screening - Comments:: Pt says her hearing is good Vision Screening - Comments:: Pt says her vision is good after cataract surgery;readers only  Dr Inez Pilgrim   Goals Addressed             This Visit's Progress    COMPLETED: DIET - INCREASE WATER INTAKE   On track    Recommend to drink at least 6-8 8oz glasses of water per day.       Depression Screen    11/04/2022   11:13 AM 10/25/2022    9:15 AM 03/29/2022    9:34 AM 09/28/2021   10:58 AM 09/23/2021    1:58 PM 02/25/2021   10:00 AM 11/04/2020    1:15 PM  PHQ 2/9 Scores  PHQ - 2 Score 0 0 0 0 0 0 0  PHQ- 9 Score  0 0 0 0 0     Fall Risk    11/04/2022   10:56 AM 10/25/2022    9:15 AM 03/29/2022    9:34 AM 09/28/2021   10:58 AM 09/23/2021    1:58 PM  Fall Risk   Falls in the past year? 0 0 0 0 0  Number falls in past yr: 0  0 0 0  Injury with Fall? 0  0 0 0  Risk for fall due to : No Fall Risks No Fall Risks No Fall Risks No Fall Risks   Follow up Education provided;Falls prevention discussed Falls prevention discussed Falls prevention discussed Falls prevention discussed     MEDICARE RISK AT HOME: Medicare Risk at Home Any stairs in or around the home?: Yes If so, are there any without handrails?: Yes Home free of loose throw rugs in walkways, pet beds, electrical cords, etc?: Yes Adequate lighting in your home to reduce risk of falls?: Yes Life alert?: No Use of a cane, walker or w/c?: No Grab bars in the bathroom?: No Shower chair or bench in shower?: Yes (walk in shower) Elevated toilet seat or a handicapped toilet?: Yes  TIMED UP AND GO:  Was the test performed?  No    Cognitive Function:        11/04/2022   11:18 AM  07/23/2019    2:44 PM 07/20/2018    1:47 PM 07/18/2017    2:29 PM  6CIT Screen  What Year? 0 points 0 points 0 points 0 points  What month? 0 points 0 points 0 points 0 points  What time? 0 points 0 points 0 points 0 points  Count back from 20 0 points 0 points 0 points 0 points  Months in reverse 0 points 0 points 0 points 0 points  Repeat phrase 0 points 0 points 0 points 0 points  Total Score 0 points 0 points 0 points 0 points    Immunizations Immunization History  Administered Date(s) Administered   Fluad Quad(high Dose 65+) 10/16/2018, 11/04/2020, 11/08/2021   Fluad Trivalent(High Dose 65+) 10/25/2022   Influenza Split 11/01/2006, 10/16/2008   Influenza, High Dose Seasonal PF 12/12/2014, 09/28/2015, 10/18/2016, 10/13/2017   Influenza, Seasonal, Injecte, Preservative Fre 09/07/2010, 01/16/2012   Influenza,inj,Quad PF,6+ Mos 12/10/2012, 10/14/2013   Influenza-Unspecified 09/12/2019   Moderna Covid-19 Fall Seasonal Vaccine 52yrs & older 11/24/2021   PFIZER Comirnaty(Gray Top)Covid-19 Tri-Sucrose Vaccine 05/09/2020   PFIZER(Purple Top)SARS-COV-2 Vaccination 01/29/2019, 02/21/2019, 10/29/2019, 05/09/2020   PNEUMOCOCCAL CONJUGATE-20 10/25/2022   Pneumococcal Conjugate-13 12/17/2013   Pneumococcal Polysaccharide-23 09/28/2015   Respiratory  Syncytial Virus Vaccine,Recomb Aduvanted(Arexvy) 11/08/2021   Tdap 03/03/2010   Zoster, Live 11/28/2007    TDAP status: Up to date  Flu Vaccine status: Up to date  Pneumococcal vaccine status: Up to date  Covid-19 vaccine status: Completed vaccines  Qualifies for Shingles Vaccine? Yes   Zostavax completed No   Shingrix Completed?: No.    Education has been provided regarding the importance of this vaccine. Patient has been advised to call insurance company to determine out of pocket expense if they have not yet received this vaccine. Advised may also receive vaccine at local pharmacy or Health Dept. Verbalized acceptance and  understanding.  Screening Tests Health Maintenance  Topic Date Due   Zoster Vaccines- Shingrix (1 of 2) 08/28/1959   DTaP/Tdap/Td (2 - Td or Tdap) 03/03/2020   COVID-19 Vaccine (7 - 2023-24 season) 09/11/2022   MAMMOGRAM  03/22/2023   Medicare Annual Wellness (AWV)  11/04/2023   Pneumonia Vaccine 24+ Years old  Completed   INFLUENZA VACCINE  Completed   DEXA SCAN  Completed   HPV VACCINES  Aged Out    Health Maintenance  Health Maintenance Due  Topic Date Due   Zoster Vaccines- Shingrix (1 of 2) 08/28/1959   DTaP/Tdap/Td (2 - Td or Tdap) 03/03/2020   COVID-19 Vaccine (7 - 2023-24 season) 09/11/2022    Colorectal cancer screening: No longer required.   Mammogram status: No longer required due to age.  Bone Density status: Completed 02/16/21. Results reflect: Bone density results: OSTEOPENIA. Repeat every 3 years.  Lung Cancer Screening: (Low Dose CT Chest recommended if Age 90-80 years, 20 pack-year currently smoking OR have quit w/in 15years.) does not qualify.   Lung Cancer Screening Referral: no  Additional Screening:  Hepatitis C Screening: does not qualify; Completed no  Vision Screening: Recommended annual ophthalmology exams for early detection of glaucoma and other disorders of the eye. Is the patient up to date with their annual eye exam?  Yes  Who is the provider or what is the name of the office in which the patient attends annual eye exams? Dr Inez Pilgrim If pt is not established with a provider, would they like to be referred to a provider to establish care? No .   Dental Screening: Recommended annual dental exams for proper oral hygiene  Diabetic Foot Exam: n/a  Community Resource Referral / Chronic Care Management: CRR required this visit?  No   CCM required this visit?  No    Plan:     I have personally reviewed and noted the following in the patient's chart:   Medical and social history Use of alcohol, tobacco or illicit drugs  Current  medications and supplements including opioid prescriptions. Patient is not currently taking opioid prescriptions. Functional ability and status Nutritional status Physical activity Advanced directives List of other physicians Hospitalizations, surgeries, and ER visits in previous 12 months Vitals Screenings to include cognitive, depression, and falls Referrals and appointments  In addition, I have reviewed and discussed with patient certain preventive protocols, quality metrics, and best practice recommendations. A written personalized care plan for preventive services as well as general preventive health recommendations were provided to patient.    Sue Lush, LPN   [id number]   After Visit Summary: (MyChart) Due to this being a telephonic visit, the after visit summary with patients personalized plan was offered to patient via MyChart   Nurse Notes: The patient states she is doing well and has no concerns or questions at this time.

## 2022-11-04 NOTE — Patient Instructions (Signed)
Theresa Hall , Thank you for taking time to come for your Medicare Wellness Visit. I appreciate your ongoing commitment to your health goals. Please review the following plan we discussed and let me know if I can assist you in the future.   Referrals/Orders/Follow-Ups/Clinician Recommendations: none  This is a list of the screening recommended for you and due dates:  Health Maintenance  Topic Date Due   Zoster (Shingles) Vaccine (1 of 2) [birth year]28/1959   DTaP/Tdap/Td vaccine (2 - Td or Tdap) 03/03/2020   COVID-19 Vaccine (7 - 2023-24 season) 09/11/2022   Mammogram  03/22/2023   Medicare Annual Wellness Visit  11/04/2023   Pneumonia Vaccine  Completed   Flu Shot  Completed   DEXA scan (bone density measurement)  Completed   HPV Vaccine  Aged Out    Advanced directives: (Copy Requested) Please bring a copy of your health care power of attorney and living will to the office to be added to your chart at your convenience.  Next Medicare Annual Wellness Visit scheduled for next year: Yes 11/16/23 @ 11:30am telephone

## 2022-11-15 ENCOUNTER — Ambulatory Visit: Payer: Medicare HMO | Admitting: Family Medicine

## 2022-11-15 DIAGNOSIS — N2581 Secondary hyperparathyroidism of renal origin: Secondary | ICD-10-CM | POA: Diagnosis not present

## 2022-11-15 DIAGNOSIS — N1831 Chronic kidney disease, stage 3a: Secondary | ICD-10-CM | POA: Diagnosis not present

## 2022-11-15 DIAGNOSIS — I129 Hypertensive chronic kidney disease with stage 1 through stage 4 chronic kidney disease, or unspecified chronic kidney disease: Secondary | ICD-10-CM | POA: Diagnosis not present

## 2022-11-17 DIAGNOSIS — N2581 Secondary hyperparathyroidism of renal origin: Secondary | ICD-10-CM | POA: Diagnosis not present

## 2022-11-17 DIAGNOSIS — I129 Hypertensive chronic kidney disease with stage 1 through stage 4 chronic kidney disease, or unspecified chronic kidney disease: Secondary | ICD-10-CM | POA: Diagnosis not present

## 2022-11-17 DIAGNOSIS — N1831 Chronic kidney disease, stage 3a: Secondary | ICD-10-CM | POA: Diagnosis not present

## 2022-12-27 ENCOUNTER — Ambulatory Visit: Payer: Self-pay

## 2022-12-27 NOTE — Progress Notes (Unsigned)
Name: Theresa Hall   MRN: [id number]    DOB: 1940/09/18   Date:12/28/2022       Progress Note  Subjective  Chief Complaint  Chief Complaint  Patient presents with   Generalized Body Aches   Chills   Nasal Congestion   Cough    Coughing up phlegm, also was around someone who was positive    HPI Discussed the use of AI scribe software for clinical note transcription with the patient, who gave verbal consent to proceed.  History of Present Illness   The patient, with a history of breast cancer,  presents with a four-day history of symptoms suggestive of an upper respiratory infection. The symptoms began on Saturday with body aches, a sore throat, nasal congestion, and some chest congestion. The patient reports a continuous nasal drip and a sore throat, which has improved slightly with warm water and salt gargles. She denies having a fever but reports feeling cold at times.  The patient's appetite has decreased, and she has been forcing herself to eat. She also reports significant fatigue, to the point of needing to rest after showering before feeling able to get dressed. Over-the-counter medication use has been limited to a single Tylenol.  The patient was exposed to a confirmed COVID-19 case during a small group meeting at her church. The individual who tested positive. The patient has been managing her symptoms at home, with a focus on hydration and rest. She reports that her urine is a bit more yellow than usual, indicating possible dehydration.  Despite the ongoing symptoms, the patient has been able to eat and drive. She reports not feeling better since the onset of symptoms but does not believe she is worse. She has been staying home and watching church services online due to her recent breast cancer treatment.  The patient has been using ipratropium nasal spray for allergies and has continued its use during this illness. She has also been advised to take Coricidin HBP for symptom  relief and Tessalon Perles for a cough that has developed. The patient completed her cancer treatment in early September.         Patient Active Problem List   Diagnosis Date Noted   Invasive carcinoma of breast (HCC) 05/12/2022   Goals of care, counseling/discussion 05/12/2022   Perennial allergic rhinitis with seasonal variation 03/29/2022   GERD without esophagitis 03/29/2022   Hypertension, benign 03/29/2022   Vitamin D deficiency 03/29/2022   Benign hypertension with chronic kidney disease, stage III (HCC) 03/29/2022   Fatty liver 03/29/2022   Benign hypertensive kidney disease with chronic kidney disease 07/22/2019   BCC (basal cell carcinoma of skin) 10/16/2017   Secondary hyperparathyroidism of renal origin (HCC) 07/18/2017   Osteopenia after menopause 08/15/2016   Reflux esophagitis 06/23/2014   Dyslipidemia 06/23/2014   Palpitations 06/23/2014   Chronic kidney disease (CKD), stage III (moderate) (HCC) 08/24/2009   History of diverticulitis 05/14/2008   Adult hypothyroidism 12/04/2006   Migraine without aura and without status migrainosus, not intractable 08/02/2006   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    Past Surgical History:  Procedure Laterality Date   APPENDECTOMY     AXILLARY LYMPH NODE DISSECTION Left 05/27/2022   Procedure: AXILLARY LYMPH NODE DISSECTION w/ SN;  Surgeon: Sung Amabile, DO;  Location: [facility];  Service: General;  Laterality: Left;   BASAL CELL CARCINOMA EXCISION     BASAL CELL CARCINOMA EXCISION  2014   nasal   BREAST BIOPSY Left 04/08/2022   US  biopsy 2 areas/ coil clip 4 oc, 4:30 venus clip/ path pending   BREAST BIOPSY Left 04/08/2022   [place] LT BREAST BX W LOC DEV 1ST LESION IMG BX SPEC US GUIDE 04/08/2022 [facility]   BREAST BIOPSY Left 04/08/2022   [place] LT BREAST BX W LOC DEV EA ADD LESION IMG BX SPEC US GUIDE 04/08/2022 [facility]   BREAST BIOPSY  04/27/2022   MM LT RADIO FREQUENCY TAG EA ADD LESION LOC MAMMO GUIDE 04/27/2022  [facility]   CATARACT EXTRACTION W/PHACO Left 07/03/2019   Procedure: CATARACT EXTRACTION PHACO AND INTRAOCULAR LENS PLACEMENT (IOC) LEFT KAHOOK DUAL BLADE GONIOTOMY 9.84 01:14.5 13.2%;  Surgeon: Lockie Mola, MD;  Location: [facility];  Service: Ophthalmology;  Laterality: Left;   CATARACT EXTRACTION W/PHACO Right 08/07/2019   Procedure: CATARACT EXTRACTION PHACO AND INTRAOCULAR LENS PLACEMENT (IOC) RIGHT KAHOOK DUAL BLADE GONIOTOMY;  Surgeon: Lockie Mola, MD;  Location: [facility];  Service: Ophthalmology;  Laterality: Right;  6.46 0:59.4 10.9%   CHOLECYSTECTOMY     COLONOSCOPY     ORIF PATELLA Right 08/15/2015   Procedure: OPEN REDUCTION INTERNAL (ORIF) FIXATION PATELLA  and application wound vac;  Surgeon: Christena Flake, MD;  Location: [facility];  Service: Orthopedics;  Laterality: Right;   TONSILLECTOMY     VAGINAL HYSTERECTOMY      Family History  Problem Relation Age of Onset   Heart attack Father        x2   Heart disease Sister    Kidney disease Sister    Diabetes Sister    Heart attack Brother    Diabetes Brother    Heart attack Brother    Breast cancer Neg Hx     Social History   Tobacco Use   Smoking status: Never   Smokeless tobacco: Never   Tobacco comments:    smoking cessation materials not required  Substance Use Topics   Alcohol use: No     Current Outpatient Medications:    acetaminophen (TYLENOL) 500 MG tablet, Take 500 mg by mouth daily as needed., Disp: , Rfl:    benzonatate (TESSALON) 100 MG capsule, Take 1-2 capsules (100-200 mg total) by mouth 3 (three) times daily as needed., Disp: 40 capsule, Rfl: 0   cholecalciferol (VITAMIN D) 1000 units tablet, Take 1 capsule by mouth once a week., Disp: , Rfl:    famotidine (PEPCID) 40 MG tablet, Take 1 tablet (40 mg total) by mouth daily., Disp: 90 tablet, Rfl: 1   ipratropium (ATROVENT) 0.06 % nasal spray, PLACE 2 SPRAYS INTO BOTH NOSTRILS 4 TIMES DAILY., Disp: 100  mL, Rfl: 1   irbesartan (AVAPRO) 75 MG tablet, Take 1 tablet (75 mg total) by mouth every evening., Disp: 90 tablet, Rfl: 1   pravastatin (PRAVACHOL) 80 MG tablet, Take 1 tablet (80 mg total) by mouth every morning., Disp: 90 tablet, Rfl: 1   SYNTHROID 75 MCG tablet, Take 1 tablet (75 mcg total) by mouth daily before breakfast. Brand name only, Disp: 90 tablet, Rfl: 1   timolol (TIMOPTIC) 0.5 % ophthalmic solution, Place 1 drop into both eyes 2 (two) times daily., Disp: , Rfl:    [START ON 01/13/2023] Zoledronic Acid (ZOMETA) 4 MG/100ML IVPB, Inject 4 mg into the vein every 6 (six) months., Disp: , Rfl:    Multiple Vitamin (MULTIVITAMIN) tablet, Take 1 tablet by mouth daily. (Patient not taking: Reported on 12/28/2022), Disp: , Rfl:   Allergies  Allergen Reactions   Codeine Other (See Comments)    Patient  had insomnia and nausea   Oxycodone Other (See Comments)    Made her "Loopy" after surgery     I personally reviewed active problem list, medication list, allergies with the patient/caregiver today.   ROS  Ten systems reviewed and is negative except as mentioned in HPI    Objective  Vitals:   12/28/22 1508 12/28/22 1532  BP: (!) 144/74 132/66  Pulse: 79   Resp: 16   Temp: 98.5 F (36.9 C)   TempSrc: Oral   SpO2: 99%   Weight: 155 lb 11.2 oz (70.6 kg)   Height: 5\' 4"  (1.626 m)     Body mass index is 26.73 kg/m.  Physical Exam  Constitutional: Patient appears well-developed and well-nourished.  No distress.  HEENT: head atraumatic, normocephalic, pupils equal and reactive to light, ears normal TM, , neck supple, throat within normal limits Cardiovascular: Normal rate, regular rhythm and normal heart sounds.  No murmur heard. No BLE edema. Pulmonary/Chest: Effort normal and breath sounds normal. No respiratory distress. Abdominal: Soft.  There is no tenderness. Psychiatric: Patient has a normal mood and affect. behavior is normal. Judgment and thought content normal.    PHQ2/9:    11/04/2022   11:13 AM 10/25/2022    9:15 AM 03/29/2022    9:34 AM 09/28/2021   10:58 AM 09/23/2021    1:58 PM  Depression screen PHQ 2/9  Decreased Interest 0 0 0 0 0  Down, Depressed, Hopeless 0 0 0 0 0  PHQ - 2 Score 0 0 0 0 0  Altered sleeping  0 0 0 0  Tired, decreased energy  0 0 0 0  Change in appetite  0 0 0 0  Feeling bad or failure about yourself   0 0 0 0  Trouble concentrating  0 0 0 0  Moving slowly or fidgety/restless  0 0 0 0  Suicidal thoughts  0 0 0 0  PHQ-9 Score  0 0 0 0  Difficult doing work/chores     Not difficult at all    phq 9 is negative   Fall Risk:    11/04/2022   10:56 AM 10/25/2022    9:15 AM 03/29/2022    9:34 AM 09/28/2021   10:58 AM 09/23/2021    1:58 PM  Fall Risk   Falls in the past year? 0 0 0 0 0  Number falls in past yr: 0  0 0 0  Injury with Fall? 0  0 0 0  Risk for fall due to : No Fall Risks No Fall Risks No Fall Risks No Fall Risks   Follow up Education provided;Falls prevention discussed Falls prevention discussed Falls prevention discussed Falls prevention discussed       Assessment & Plan      Suspected COVID-19 Symptoms of body aches, sore throat, nasal congestion, chest congestion, fatigue, and loss of appetite for 4 days. Recent exposure to a confirmed COVID-19 case. No fever reported. -Perform COVID-19 swab for confirmation. -Advise to continue hydration and nutrition as tolerated. -Recommend Coricidin HBP for symptom management. -Provide Tessalon Perles for cough as needed.  Hypertension Elevated blood pressure noted during visit, possibly due to current illness. -Monitor blood pressure.

## 2022-12-27 NOTE — Telephone Encounter (Signed)
Message from [place] C sent at 12/27/2022  9:05 AM EST  Summary: cold and flu lke symptoms   The patient has been notified that they've been in close contact with someone positive for COVID 19  The patient shares that they're experiencing congestion, body aches, sore throat and cough  The patient  shares that they began to experience symptoms on Saturday 12/24/22  Please contact the patient further when possible         Chief Complaint: sore throat, body aches Symptoms: cough, hoarse, head congestion  Frequency: Sx started Sat Pertinent Negatives: Patient denies SOB, wheezing Disposition: [] ED /[] Urgent Care (no appt availability in office) / [x] Appointment(In office/virtual)/ []  [place] [facility]/ [] Home Care/ [] Refused Recommended Disposition /[]  Mobile Bus/ []  Follow-up with PCP Additional Notes: offered virtual but pt wanted to be seen in person.  Reason for Disposition  [1] HIGH RISK patient (e.g., weak immune system, age > 64 years, obesity with BMI 30 or higher, pregnant, chronic lung disease or other chronic medical condition) AND [2] COVID symptoms (e.g., cough, fever)  (Exceptions: Already seen by PCP and no new or worsening symptoms.)  Answer Assessment - Initial Assessment Questions 1. COVID-19 DIAGNOSIS: "How do you know that you have COVID?" (e.g., positive lab test or self-test, diagnosed by doctor or NP/PA, symptoms after exposure).     Sx after exposure 2. COVID-19 EXPOSURE: "Was there any known exposure to COVID before the symptoms began?" CDC Definition of close contact: within 6 feet (2 meters) for a total of 15 minutes or more over a 24-hour period.      Yes/last Wednesday  3. ONSET: "When did the COVID-19 symptoms start?"      Saturday  4. WORST SYMPTOM: "What is your worst symptom?" (e.g., cough, fever, shortness of breath, muscle aches)     Body aches weakness 5. COUGH: "Do you have a cough?" If Yes, ask: "How bad is the cough?"        yes 6. FEVER: "Do you have a fever?" If Yes, ask: "What is your temperature, how was it measured, and when did it start?"     no 7. RESPIRATORY STATUS: "Describe your breathing?" (e.g., normal; shortness of breath, wheezing, unable to speak)      normal 9. OTHER SYMPTOMS: "Do you have any other symptoms?"  (e.g., chills, fatigue, headache, loss of smell or taste, muscle pain, sore throat)     Body aches, sore throat severe, hoarse, head congestion, post nasal drainage, weakness 10. HIGH RISK DISEASE: "Do you have any chronic medical problems?" (e.g., asthma, heart or lung disease, weak immune system, obesity, etc.)       Weak immune system (breast cancer)  12. PREGNANCY: "Is there any chance you are pregnant?" "When was your last menstrual period?"       N/a 13. O2 SATURATION MONITOR:  "Do you use an oxygen saturation monitor (pulse oximeter) at home?" If Yes, ask "What is your reading (oxygen level) today?" "What is your usual oxygen saturation reading?" (e.g., 95%)       N/a  Protocols used: Coronavirus (COVID-19) Diagnosed or Suspected-A-AH

## 2022-12-28 ENCOUNTER — Encounter: Payer: Self-pay | Admitting: Family Medicine

## 2022-12-28 ENCOUNTER — Ambulatory Visit (INDEPENDENT_AMBULATORY_CARE_PROVIDER_SITE_OTHER): Payer: Medicare HMO | Admitting: Family Medicine

## 2022-12-28 VITALS — BP 132/66 | HR 79 | Temp 98.5°F | Resp 16 | Ht 64.0 in | Wt 155.7 lb

## 2022-12-28 DIAGNOSIS — J069 Acute upper respiratory infection, unspecified: Secondary | ICD-10-CM

## 2022-12-28 DIAGNOSIS — R52 Pain, unspecified: Secondary | ICD-10-CM

## 2022-12-28 DIAGNOSIS — Z20822 Contact with and (suspected) exposure to covid-19: Secondary | ICD-10-CM

## 2022-12-28 MED ORDER — BENZONATATE 100 MG PO CAPS
100.0000 mg | ORAL_CAPSULE | Freq: Three times a day (TID) | ORAL | 0 refills | Status: DC | PRN
Start: 2022-12-28 — End: 2023-05-15

## 2022-12-28 NOTE — Patient Instructions (Signed)
Coricidin HBP.

## 2022-12-30 ENCOUNTER — Telehealth: Payer: Self-pay | Admitting: Family Medicine

## 2022-12-30 ENCOUNTER — Encounter: Payer: Self-pay | Admitting: Family Medicine

## 2022-12-30 LAB — SPECIMEN STATUS REPORT

## 2022-12-30 LAB — NOVEL CORONAVIRUS, NAA: SARS-CoV-2, NAA: DETECTED — AB

## 2022-12-30 NOTE — Telephone Encounter (Signed)
Dr.Sowles has reviewed pt result note and I have already contacted pt and documented under her COVID TEST RESULT

## 2022-12-30 NOTE — Telephone Encounter (Signed)
Patient called stated she is aware that she tested positive for Covid but needs someone to call her back to let her know what is next. Please f/u with patient

## 2023-02-14 DIAGNOSIS — L821 Other seborrheic keratosis: Secondary | ICD-10-CM | POA: Diagnosis not present

## 2023-02-14 DIAGNOSIS — D2272 Melanocytic nevi of left lower limb, including hip: Secondary | ICD-10-CM | POA: Diagnosis not present

## 2023-02-14 DIAGNOSIS — Z85820 Personal history of malignant melanoma of skin: Secondary | ICD-10-CM | POA: Diagnosis not present

## 2023-02-14 DIAGNOSIS — L817 Pigmented purpuric dermatosis: Secondary | ICD-10-CM | POA: Diagnosis not present

## 2023-02-14 DIAGNOSIS — D225 Melanocytic nevi of trunk: Secondary | ICD-10-CM | POA: Diagnosis not present

## 2023-02-14 DIAGNOSIS — D2261 Melanocytic nevi of right upper limb, including shoulder: Secondary | ICD-10-CM | POA: Diagnosis not present

## 2023-02-14 DIAGNOSIS — D2271 Melanocytic nevi of right lower limb, including hip: Secondary | ICD-10-CM | POA: Diagnosis not present

## 2023-02-14 DIAGNOSIS — D2262 Melanocytic nevi of left upper limb, including shoulder: Secondary | ICD-10-CM | POA: Diagnosis not present

## 2023-02-14 DIAGNOSIS — Z85828 Personal history of other malignant neoplasm of skin: Secondary | ICD-10-CM | POA: Diagnosis not present

## 2023-02-14 DIAGNOSIS — Z08 Encounter for follow-up examination after completed treatment for malignant neoplasm: Secondary | ICD-10-CM | POA: Diagnosis not present

## 2023-02-20 ENCOUNTER — Other Ambulatory Visit: Payer: Self-pay | Admitting: Family Medicine

## 2023-02-20 DIAGNOSIS — J302 Other seasonal allergic rhinitis: Secondary | ICD-10-CM

## 2023-02-20 DIAGNOSIS — E785 Hyperlipidemia, unspecified: Secondary | ICD-10-CM

## 2023-02-21 NOTE — Telephone Encounter (Signed)
Requested Prescriptions  Pending Prescriptions Disp Refills   fluticasone (FLONASE) 50 MCG/ACT nasal spray [Pharmacy Med Name: FLUTICASONE PROP 50 MCG SPRAY] 16 mL 0    Sig: SPRAY 1 SPRAY INTO EACH NOSTRIL EVERY DAY     Ear, Nose, and Throat: Nasal Preparations - Corticosteroids Passed - 02/21/2023 11:13 AM      Passed - Valid encounter within last 12 months    Recent Outpatient Visits           1 month ago Body aches   [place] [facility] [place], Danna Hefty, MD   3 months ago Benign hypertension with chronic kidney disease, stage III [facility])   [facility] [address] [place], Danna Hefty, MD   10 months ago Benign hypertension with chronic kidney disease, stage III [facility])   [place] [facility] Alba Cory, MD   1 year ago Benign hypertension with chronic kidney disease, stage III [facility])   [place] [facility] Alba Cory, MD   1 year ago Upper back strain, initial encounter   [facility] Margarita Mail, DO       Future Appointments             In 2 months Alba Cory, MD [facility]             pravastatin (PRAVACHOL) 80 MG tablet [Pharmacy Med Name: PRAVASTATIN SODIUM 80 MG TAB] 90 tablet 1    Sig: TAKE 1 TABLET BY MOUTH EVERY MORNING.     Cardiovascular:  Antilipid - Statins Failed - 02/21/2023 11:13 AM      Failed - Lipid Panel in normal range within the last 12 months    Cholesterol, Total  Date Value Ref Range Status  01/08/2015 162 100 - 199 mg/dL Final   Cholesterol  Date Value Ref Range Status  03/29/2022 170 <200 mg/dL Final   LDL Cholesterol (Calc)  Date Value Ref Range Status  03/29/2022 87 mg/dL (calc) Final    Comment:    Reference range: <100 . Desirable range <100 mg/dL for primary prevention;   <70 mg/dL for patients with CHD or diabetic patients  with > or = 2 CHD risk factors. Marland Kitchen LDL-C is now calculated using  the Martin-Hopkins  calculation, which is a validated novel method providing  better accuracy than the Friedewald equation in the  estimation of LDL-C.  Horald Pollen et al. Lenox Ahr. 5409;811(91): 2061-2068  (http://education.[facility].com/faq/FAQ164)    HDL  Date Value Ref Range Status  03/29/2022 54 > OR = 50 mg/dL Final  47/82/9562 62 >13 mg/dL Final   Triglycerides  Date Value Ref Range Status  03/29/2022 203 (H) <150 mg/dL Final    Comment:    . If a non-fasting specimen was collected, consider repeat triglyceride testing on a fasting specimen if clinically indicated.  Perry Mount et al. J. of Clin. Lipidol. 2015;9:129-169. Marland Kitchen          Passed - Patient is not pregnant      Passed - Valid encounter within last 12 months    Recent Outpatient Visits           1 month ago Body aches   [place] [facility], Danna Hefty, MD   3 months ago Benign hypertension with chronic kidney disease, stage III [facility])   [facility] [place] [facility] Alba Cory, MD   10 months ago Benign hypertension with chronic kidney disease, stage III [facility])   [place]  [facility] Alba Cory, MD   1 year ago Benign hypertension with chronic kidney disease, stage III [facility])   [place] [facility] Alba Cory, MD   1 year ago Upper back strain, initial encounter   [facility] Margarita Mail, DO       Future Appointments             In 2 months Carlynn Purl, Danna Hefty, MD [facility]

## 2023-02-28 DIAGNOSIS — Z171 Estrogen receptor negative status [ER-]: Secondary | ICD-10-CM | POA: Diagnosis not present

## 2023-02-28 DIAGNOSIS — Z5181 Encounter for therapeutic drug level monitoring: Secondary | ICD-10-CM | POA: Diagnosis not present

## 2023-02-28 DIAGNOSIS — Z1231 Encounter for screening mammogram for malignant neoplasm of breast: Secondary | ICD-10-CM | POA: Diagnosis not present

## 2023-02-28 DIAGNOSIS — Z7983 Long term (current) use of bisphosphonates: Secondary | ICD-10-CM | POA: Diagnosis not present

## 2023-02-28 DIAGNOSIS — Z17421 Hormone receptor negative with human epidermal growth factor receptor 2 negative status: Secondary | ICD-10-CM | POA: Diagnosis not present

## 2023-02-28 DIAGNOSIS — C50512 Malignant neoplasm of lower-outer quadrant of left female breast: Secondary | ICD-10-CM | POA: Diagnosis not present

## 2023-02-28 DIAGNOSIS — C50912 Malignant neoplasm of unspecified site of left female breast: Secondary | ICD-10-CM | POA: Diagnosis not present

## 2023-03-08 ENCOUNTER — Other Ambulatory Visit: Payer: Self-pay | Admitting: Hematology and Oncology

## 2023-03-08 DIAGNOSIS — Z853 Personal history of malignant neoplasm of breast: Secondary | ICD-10-CM

## 2023-03-21 DIAGNOSIS — H401131 Primary open-angle glaucoma, bilateral, mild stage: Secondary | ICD-10-CM | POA: Diagnosis not present

## 2023-03-28 DIAGNOSIS — H353131 Nonexudative age-related macular degeneration, bilateral, early dry stage: Secondary | ICD-10-CM | POA: Diagnosis not present

## 2023-03-28 DIAGNOSIS — H04123 Dry eye syndrome of bilateral lacrimal glands: Secondary | ICD-10-CM | POA: Diagnosis not present

## 2023-03-28 DIAGNOSIS — Z961 Presence of intraocular lens: Secondary | ICD-10-CM | POA: Diagnosis not present

## 2023-03-28 DIAGNOSIS — H401131 Primary open-angle glaucoma, bilateral, mild stage: Secondary | ICD-10-CM | POA: Diagnosis not present

## 2023-03-31 ENCOUNTER — Ambulatory Visit
Admission: RE | Admit: 2023-03-31 | Discharge: 2023-03-31 | Disposition: A | Discharge status: Home / Self Care | Source: Ambulatory Visit | Attending: Hematology and Oncology | Admitting: Hematology and Oncology

## 2023-03-31 DIAGNOSIS — R92323 Mammographic fibroglandular density, bilateral breasts: Secondary | ICD-10-CM | POA: Diagnosis not present

## 2023-03-31 DIAGNOSIS — Z853 Personal history of malignant neoplasm of breast: Secondary | ICD-10-CM | POA: Diagnosis not present

## 2023-05-15 ENCOUNTER — Encounter: Payer: Self-pay | Admitting: Family Medicine

## 2023-05-15 ENCOUNTER — Ambulatory Visit: Payer: Self-pay | Admitting: Family Medicine

## 2023-05-15 VITALS — BP 118/76 | HR 82 | Resp 16 | Ht 64.0 in | Wt 154.4 lb

## 2023-05-15 DIAGNOSIS — I129 Hypertensive chronic kidney disease with stage 1 through stage 4 chronic kidney disease, or unspecified chronic kidney disease: Secondary | ICD-10-CM | POA: Diagnosis not present

## 2023-05-15 DIAGNOSIS — E559 Vitamin D deficiency, unspecified: Secondary | ICD-10-CM | POA: Diagnosis not present

## 2023-05-15 DIAGNOSIS — C50512 Malignant neoplasm of lower-outer quadrant of left female breast: Secondary | ICD-10-CM

## 2023-05-15 DIAGNOSIS — E785 Hyperlipidemia, unspecified: Secondary | ICD-10-CM | POA: Diagnosis not present

## 2023-05-15 DIAGNOSIS — E039 Hypothyroidism, unspecified: Secondary | ICD-10-CM

## 2023-05-15 DIAGNOSIS — Z8616 Personal history of COVID-19: Secondary | ICD-10-CM

## 2023-05-15 DIAGNOSIS — N183 Chronic kidney disease, stage 3 unspecified: Secondary | ICD-10-CM | POA: Diagnosis not present

## 2023-05-15 DIAGNOSIS — M8580 Other specified disorders of bone density and structure, unspecified site: Secondary | ICD-10-CM | POA: Diagnosis not present

## 2023-05-15 DIAGNOSIS — Z171 Estrogen receptor negative status [ER-]: Secondary | ICD-10-CM

## 2023-05-15 DIAGNOSIS — N1831 Chronic kidney disease, stage 3a: Secondary | ICD-10-CM | POA: Diagnosis not present

## 2023-05-15 DIAGNOSIS — R5383 Other fatigue: Secondary | ICD-10-CM | POA: Diagnosis not present

## 2023-05-15 DIAGNOSIS — K219 Gastro-esophageal reflux disease without esophagitis: Secondary | ICD-10-CM

## 2023-05-15 DIAGNOSIS — Z780 Asymptomatic menopausal state: Secondary | ICD-10-CM | POA: Diagnosis not present

## 2023-05-15 NOTE — Progress Notes (Signed)
 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 Denies side effects. She has noticed some intermittent chest pain, sometimes SOB intermittently since COVID. She denies diaphoresis.   Stress: she does not have family in town, she worries about getting sick and having to move in with her children  She had COVID 12/18, she states around Christmas she was feeling better, but since that episode she has a decrease in energy and motivation at times. She has episodes of SOB intermittently with mild activity.  She has a history of chronic fatigue syndrome    Hypothyroidism: TSH was at goal last visit  She denies dry skin, hair loss, change in bowel movement or dysphagia.  She has been feeling very tired since Dec. She is not sure the cause. We will recheck labs    Migraine headaches: no episodes in over 14 years, last migraine when her father died,  she finally came off Topamax  last year and no recurrence Doing well    Hyperlipidemia: taking medication and denies side effects, reviewed labs done LDL went up from 88 to 93  She will have labs done today    Osteopenia: discussed results, discussed high calcium  and vitamin D  diet and she has been taking low dose fosamax  without side effects. Reviewed bone density done 02/2021 and showed mild improvement on femur, and slightly worse on wrist from -2.3 to -2.4 , she is off Alendronate  and now on Zoledronic acid for cancer    GERD/Hiatal hernia: she states symptoms are intermittent, she eats dinner around 7 pm, smaller portions , she states rarely has symptoms now, she is taking Pepcid  at night prn  She notices intermittent sharp or burning substernal pain    Fatty liver: found on CT abdomen and  pelvis. On statin therapy and healthy diet . Last liver enzymes at goal . We will recheck labs    Breast cancer : she was diagnosed Spring of 2024, she has lumpectomy June 2024 and started on Zoledronic acid in January 2025 to decrease breast cancer recurrent. She had radiation therapy done at [facility] . Up to date with follow ups.   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   Secondary hyperparathyroidism of renal origin (HCC) 07/18/2017   Osteopenia after menopause 08/15/2016   Reflux esophagitis 06/23/2014   Dyslipidemia 06/23/2014   Palpitations 06/23/2014   Chronic kidney disease (CKD), stage III (moderate) (HCC) 08/24/2009   History of diverticulitis 05/14/2008   Adult hypothyroidism 12/04/2006   Migraine without aura and without status migrainosus, not intractable 08/02/2006   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    Past Surgical History:  Procedure Laterality Date   APPENDECTOMY     AXILLARY LYMPH NODE DISSECTION Left 05/27/2022  Procedure: AXILLARY LYMPH NODE DISSECTION w/ SN;  Surgeon: Conrado Delay, DO;  Location: [facility];  Service: General;  Laterality: Left;   BASAL CELL CARCINOMA EXCISION     BASAL CELL CARCINOMA EXCISION  2014   nasal   BREAST BIOPSY Left 04/08/2022   us  biopsy 2 areas/ coil clip 4 oc, 4:30 venus clip/ path pending   BREAST BIOPSY Left 04/08/2022   US  LT BREAST BX W LOC DEV 1ST LESION IMG BX SPEC US  GUIDE 04/08/2022 [facility]   BREAST BIOPSY  Left 04/08/2022   US  LT BREAST BX W LOC DEV EA ADD LESION IMG BX SPEC US  GUIDE 04/08/2022 [facility]   BREAST BIOPSY  04/27/2022   MM LT RADIO FREQUENCY TAG EA ADD LESION LOC MAMMO GUIDE 04/27/2022 [facility]   BREAST LUMPECTOMY Left 04/2022   CATARACT EXTRACTION W/PHACO Left 07/03/2019   Procedure: CATARACT EXTRACTION PHACO AND INTRAOCULAR LENS PLACEMENT (IOC) LEFT KAHOOK DUAL BLADE GONIOTOMY 9.84 01:14.5 13.2%;  Surgeon: Annell Kidney, MD;  Location: [facility];  Service: Ophthalmology;  Laterality: Left;   CATARACT EXTRACTION W/PHACO Right 08/07/2019   Procedure: CATARACT EXTRACTION PHACO AND INTRAOCULAR LENS PLACEMENT (IOC) RIGHT KAHOOK DUAL BLADE GONIOTOMY;  Surgeon: Annell Kidney, MD;  Location: [facility];  Service: Ophthalmology;  Laterality: Right;  6.46 0:59.4 10.9%   CHOLECYSTECTOMY     COLONOSCOPY     ORIF PATELLA Right 08/15/2015   Procedure: OPEN REDUCTION INTERNAL (ORIF) FIXATION PATELLA  and application wound vac;  Surgeon: Elner Hahn, MD;  Location: [facility];  Service: Orthopedics;  Laterality: Right;   TONSILLECTOMY     VAGINAL HYSTERECTOMY      Family History  Problem Relation Age of Onset   Heart attack Father        x2   Heart disease Sister    Kidney disease Sister    Diabetes Sister    Heart attack Brother    Diabetes Brother    Heart attack Brother    Breast cancer Neg Hx     Social History   Tobacco Use   Smoking status: Never   Smokeless tobacco: Never   Tobacco comments:    smoking cessation materials not required  Substance Use Topics   Alcohol use: No     Current Outpatient Medications:    acetaminophen  (TYLENOL ) 500 MG tablet, Take 500 mg by mouth daily as needed., Disp: , Rfl:    cholecalciferol (VITAMIN D ) 1000 units tablet, Take 1 capsule by mouth once a week., Disp: , Rfl:    famotidine  (PEPCID ) 40 MG tablet, Take 1 tablet (40 mg total) by mouth daily., Disp: 90 tablet, Rfl: 1   ipratropium  (ATROVENT ) 0.06 % nasal spray, PLACE 2 SPRAYS INTO BOTH NOSTRILS 4 TIMES DAILY., Disp: 100 mL, Rfl: 1   irbesartan  (AVAPRO ) 75 MG tablet, Take 1 tablet (75 mg total) by mouth every evening., Disp: 90 tablet, Rfl: 1   Multiple Vitamin (MULTIVITAMIN) tablet, Take 1 tablet by mouth daily., Disp: , Rfl:    pravastatin  (PRAVACHOL ) 80 MG tablet, TAKE 1 TABLET BY MOUTH EVERY MORNING., Disp: 90 tablet, Rfl: 0   SYNTHROID  75 MCG tablet, Take 1 tablet (75 mcg total) by mouth daily before breakfast. Brand name only, Disp: 90 tablet, Rfl: 1   timolol  (TIMOPTIC ) 0.5 % ophthalmic solution, Place 1 drop into both eyes 2 (two) times daily., Disp: , Rfl:    Zoledronic Acid (ZOMETA) 4 MG/100ML IVPB, Inject 4 mg into the vein every 6 (six) months., Disp: ,  Rfl:    benzonatate  (TESSALON ) 100 MG capsule, Take 1-2 capsules (100-200 mg total) by mouth 3 (three) times daily as needed. (Patient not taking: Reported on 05/15/2023), Disp: 40 capsule, Rfl: 0  Allergies  Allergen Reactions   Codeine Other (See Comments)    Patient had insomnia and nausea   Oxycodone  Other (See Comments)    Made her "Loopy" after surgery     I personally reviewed active problem list, medication list, allergies, family history with the patient/caregiver today.   ROS  Ten systems reviewed and is negative except as mentioned in HPI    Objective Physical Exam Constitutional: Patient appears well-developed and well-nourished.  No distress.  HEENT: head atraumatic, normocephalic, pupils equal and reactive to light, neck supple Cardiovascular: Normal rate, regular rhythm and normal heart sounds.  No murmur heard. No BLE edema. Pulmonary/Chest: Effort normal and breath sounds normal. No respiratory distress. Abdominal: Soft.  There is no tenderness. Psychiatric: Patient has a normal mood and affect. behavior is normal. Judgment and thought content normal.   Vitals:   05/15/23 1111  BP: 118/76  Pulse: 82  Resp: 16  SpO2: 99%  Weight:  154 lb 6.4 oz (70 kg)  Height: 5\' 4"  (1.626 m)    Body mass index is 26.5 kg/m.   PHQ2/9:    05/15/2023   11:08 AM 11/04/2022   11:13 AM 10/25/2022    9:15 AM 03/29/2022    9:34 AM 09/28/2021   10:58 AM  Depression screen PHQ 2/9  Decreased Interest 0 0 0 0 0  Down, Depressed, Hopeless 0 0 0 0 0  PHQ - 2 Score 0 0 0 0 0  Altered sleeping   0 0 0  Tired, decreased energy   0 0 0  Change in appetite   0 0 0  Feeling bad or failure about yourself    0 0 0  Trouble concentrating   0 0 0  Moving slowly or fidgety/restless   0 0 0  Suicidal thoughts   0 0 0  PHQ-9 Score   0 0 0    phq 9 is negative  Fall Risk:    05/15/2023   11:08 AM 11/04/2022   10:56 AM 10/25/2022    9:15 AM 03/29/2022    9:34 AM 09/28/2021   10:58 AM  Fall Risk   Falls in the past year? 0 0 0 0 0  Number falls in past yr: 0 0  0 0  Injury with Fall? 0 0  0 0  Risk for fall due to : No Fall Risks No Fall Risks No Fall Risks No Fall Risks No Fall Risks  Follow up Falls prevention discussed;Education provided;Falls evaluation completed Education provided;Falls prevention discussed Falls prevention discussed Falls prevention discussed Falls prevention discussed     Assessment & Plan  1. Stage 3a chronic kidney disease (HCC) (Primary)  - VITAMIN D  25 Hydroxy (Vit-D Deficiency, Fractures)  2. Benign hypertension with chronic kidney disease, stage III (HCC)  - CBC with Differential/Platelet - Comprehensive metabolic panel with GFR  3. Malignant neoplasm of lower-outer quadrant of left breast of female, estrogen receptor negative (HCC)  Under the care of oncologist , now on Zoledronic acid  4. Adult hypothyroidism  - TSH  5. Osteopenia after menopause  Getting infusions at [facility]  6. GERD without esophagitis  stable  7. Vitamin D  deficiency  - VITAMIN D  25 Hydroxy (Vit-D Deficiency, Fractures)  8. Other fatigue  It could be secondary to  stress, post covid syndrome, we will check labs and  monitor - 12 and Folate Panel - Sedimentation rate - C-reactive protein  9. Dyslipidemia  - Lipid panel  10. History of COVID-19

## 2023-05-16 ENCOUNTER — Encounter: Payer: Self-pay | Admitting: Family Medicine

## 2023-05-16 LAB — LIPID PANEL
Cholesterol: 176 mg/dL (ref <200)
HDL: 52 mg/dL (ref >=50)
LDL Cholesterol (Calc): 95 mg/dL
Non-HDL Cholesterol (Calc): 124 mg/dL (ref <130)
Total CHOL/HDL Ratio: 3.4 (calc) (ref <5.0)
Triglycerides: 196 mg/dL — ABNORMAL HIGH (ref <150)

## 2023-05-16 LAB — B12 AND FOLATE PANEL
Folate: >24 ng/mL
Vitamin B-12: 901 pg/mL (ref 200–1100)

## 2023-05-16 LAB — COMPREHENSIVE METABOLIC PANEL WITH GFR
AG Ratio: 1.8 (calc) (ref 1.0–2.5)
ALT: 29 U/L (ref 6–29)
AST: 23 U/L (ref 10–35)
Albumin: 4.6 g/dL (ref 3.6–5.1)
Alkaline phosphatase (APISO): 47 U/L (ref 37–153)
BUN/Creatinine Ratio: 17 (calc) (ref 6–22)
BUN: 18 mg/dL (ref 7–25)
CO2: 25 mmol/L (ref 20–32)
Calcium: 10.4 mg/dL (ref 8.6–10.4)
Chloride: 104 mmol/L (ref 98–110)
Creat: 1.03 mg/dL — ABNORMAL HIGH (ref 0.60–0.95)
Globulin: 2.6 g/dL (ref 1.9–3.7)
Glucose, Bld: 100 mg/dL — ABNORMAL HIGH (ref 65–99)
Potassium: 4.8 mmol/L (ref 3.5–5.3)
Sodium: 139 mmol/L (ref 135–146)
Total Bilirubin: 0.6 mg/dL (ref 0.2–1.2)
Total Protein: 7.2 g/dL (ref 6.1–8.1)
eGFR: 54 mL/min/{1.73_m2} — ABNORMAL LOW (ref >=60)

## 2023-05-16 LAB — CBC WITH DIFFERENTIAL/PLATELET
Absolute Lymphocytes: 1411 {cells}/uL (ref 850–3900)
Absolute Monocytes: 420 {cells}/uL (ref 200–950)
Basophils Absolute: 42 {cells}/uL (ref 0–200)
Basophils Relative: 1 %
Eosinophils Absolute: 109 {cells}/uL (ref 15–500)
Eosinophils Relative: 2.6 %
HCT: 39.5 % (ref 35.0–45.0)
Hemoglobin: 13.4 g/dL (ref 11.7–15.5)
MCH: 31.7 pg (ref 27.0–33.0)
MCHC: 33.9 g/dL (ref 32.0–36.0)
MCV: 93.4 fL (ref 80.0–100.0)
MPV: 11.9 fL (ref 7.5–12.5)
Monocytes Relative: 10 %
Neutro Abs: 2218 {cells}/uL (ref 1500–7800)
Neutrophils Relative %: 52.8 %
Platelets: 204 10*3/uL (ref 140–400)
RBC: 4.23 10*6/uL (ref 3.80–5.10)
RDW: 12.4 % (ref 11.0–15.0)
Total Lymphocyte: 33.6 %
WBC: 4.2 10*3/uL (ref 3.8–10.8)

## 2023-05-16 LAB — VITAMIN D 25 HYDROXY (VIT D DEFICIENCY, FRACTURES): Vit D, 25-Hydroxy: 64 ng/mL (ref 30–100)

## 2023-05-16 LAB — TSH: TSH: 0.59 m[IU]/L (ref 0.40–4.50)

## 2023-05-16 LAB — C-REACTIVE PROTEIN: CRP: <3 mg/L (ref <8.0)

## 2023-05-16 LAB — SEDIMENTATION RATE: Sed Rate: 22 mm/h (ref 0–30)

## 2023-05-25 ENCOUNTER — Other Ambulatory Visit: Payer: Self-pay | Admitting: Family Medicine

## 2023-05-25 DIAGNOSIS — K219 Gastro-esophageal reflux disease without esophagitis: Secondary | ICD-10-CM

## 2023-06-03 DIAGNOSIS — Z833 Family history of diabetes mellitus: Secondary | ICD-10-CM | POA: Diagnosis not present

## 2023-06-03 DIAGNOSIS — J302 Other seasonal allergic rhinitis: Secondary | ICD-10-CM | POA: Diagnosis not present

## 2023-06-03 DIAGNOSIS — Z008 Encounter for other general examination: Secondary | ICD-10-CM | POA: Diagnosis not present

## 2023-06-03 DIAGNOSIS — M1990 Unspecified osteoarthritis, unspecified site: Secondary | ICD-10-CM | POA: Diagnosis not present

## 2023-06-03 DIAGNOSIS — Z8249 Family history of ischemic heart disease and other diseases of the circulatory system: Secondary | ICD-10-CM | POA: Diagnosis not present

## 2023-06-03 DIAGNOSIS — Z85828 Personal history of other malignant neoplasm of skin: Secondary | ICD-10-CM | POA: Diagnosis not present

## 2023-06-03 DIAGNOSIS — I129 Hypertensive chronic kidney disease with stage 1 through stage 4 chronic kidney disease, or unspecified chronic kidney disease: Secondary | ICD-10-CM | POA: Diagnosis not present

## 2023-06-03 DIAGNOSIS — E039 Hypothyroidism, unspecified: Secondary | ICD-10-CM | POA: Diagnosis not present

## 2023-06-03 DIAGNOSIS — H409 Unspecified glaucoma: Secondary | ICD-10-CM | POA: Diagnosis not present

## 2023-06-03 DIAGNOSIS — N1831 Chronic kidney disease, stage 3a: Secondary | ICD-10-CM | POA: Diagnosis not present

## 2023-06-03 DIAGNOSIS — I700 Atherosclerosis of aorta: Secondary | ICD-10-CM | POA: Diagnosis not present

## 2023-06-03 DIAGNOSIS — E785 Hyperlipidemia, unspecified: Secondary | ICD-10-CM | POA: Diagnosis not present

## 2023-06-03 DIAGNOSIS — K219 Gastro-esophageal reflux disease without esophagitis: Secondary | ICD-10-CM | POA: Diagnosis not present

## 2023-06-17 ENCOUNTER — Other Ambulatory Visit: Payer: Self-pay | Admitting: Family Medicine

## 2023-06-17 DIAGNOSIS — E785 Hyperlipidemia, unspecified: Secondary | ICD-10-CM

## 2023-07-08 ENCOUNTER — Other Ambulatory Visit: Payer: Self-pay | Admitting: Family Medicine

## 2023-07-08 DIAGNOSIS — I10 Essential (primary) hypertension: Secondary | ICD-10-CM

## 2023-07-08 DIAGNOSIS — E039 Hypothyroidism, unspecified: Secondary | ICD-10-CM

## 2023-07-08 DIAGNOSIS — N1831 Chronic kidney disease, stage 3a: Secondary | ICD-10-CM

## 2023-07-08 DIAGNOSIS — N183 Chronic kidney disease, stage 3 unspecified: Secondary | ICD-10-CM

## 2023-07-28 ENCOUNTER — Encounter: Payer: Self-pay | Admitting: Advanced Practice Midwife

## 2023-08-21 ENCOUNTER — Encounter: Payer: Self-pay | Admitting: Family Medicine

## 2023-08-21 ENCOUNTER — Ambulatory Visit (INDEPENDENT_AMBULATORY_CARE_PROVIDER_SITE_OTHER): Admitting: Family Medicine

## 2023-08-21 ENCOUNTER — Other Ambulatory Visit: Payer: Self-pay | Admitting: Family Medicine

## 2023-08-21 VITALS — BP 130/74 | HR 92 | Resp 16 | Ht 64.0 in | Wt 155.3 lb

## 2023-08-21 DIAGNOSIS — Z780 Asymptomatic menopausal state: Secondary | ICD-10-CM | POA: Diagnosis not present

## 2023-08-21 DIAGNOSIS — F341 Dysthymic disorder: Secondary | ICD-10-CM | POA: Diagnosis not present

## 2023-08-21 DIAGNOSIS — R5383 Other fatigue: Secondary | ICD-10-CM | POA: Diagnosis not present

## 2023-08-21 DIAGNOSIS — M8580 Other specified disorders of bone density and structure, unspecified site: Secondary | ICD-10-CM | POA: Diagnosis not present

## 2023-08-21 DIAGNOSIS — Z853 Personal history of malignant neoplasm of breast: Secondary | ICD-10-CM | POA: Insufficient documentation

## 2023-08-21 DIAGNOSIS — Z8616 Personal history of COVID-19: Secondary | ICD-10-CM | POA: Diagnosis not present

## 2023-08-21 DIAGNOSIS — K219 Gastro-esophageal reflux disease without esophagitis: Secondary | ICD-10-CM

## 2023-08-21 DIAGNOSIS — N1831 Chronic kidney disease, stage 3a: Secondary | ICD-10-CM | POA: Diagnosis not present

## 2023-08-21 DIAGNOSIS — N2581 Secondary hyperparathyroidism of renal origin: Secondary | ICD-10-CM

## 2023-08-21 DIAGNOSIS — E039 Hypothyroidism, unspecified: Secondary | ICD-10-CM

## 2023-08-21 MED ORDER — SERTRALINE HCL 50 MG PO TABS: 25.0000 mg | ORAL_TABLET | Freq: Every day | ORAL | 0 refills | Status: DC

## 2023-08-21 MED ORDER — SERTRALINE HCL 25 MG PO TABS: 25.0000 mg | ORAL_TABLET | Freq: Every day | ORAL | 0 refills | Status: DC

## 2023-08-21 NOTE — Progress Notes (Signed)
 Name: Theresa Hall   MRN: [id number]    DOB: [birth year]   Date:08/21/2023       Progress Note  Subjective  Chief Complaint  Chief Complaint  Patient presents with   Medical Management of Chronic Issues   Discussed the use of AI scribe software for clinical note transcription with the patient, who gave verbal consent to proceed.  History of Present Illness Theresa Hall is an 83 year old female who presents with persistent exhaustion and fatigue.  She has experienced persistent exhaustion and fatigue since her last visit three months ago. She feels 'drained and tired' after activities such as grocery shopping, requiring rest after bringing in and putting away groceries. The fatigue is primarily associated with activity and is less pronounced when walking. No significant shortness of breath, but she occasionally experiences a 'full feeling' in her chest, which is not constant.  Her medical history includes breast cancer treated with lumpectomy and radiation therapy, with her last treatment last Fall. She does not take any suppressive therapy or estrogen-related medications. She receives Zometa infusions every six months for osteopenia. She also has chronic kidney disease stage 3A, hypothyroidism, and secondary hyperparathyroidism. Recent lab work showed normal inflammatory markers, cholesterol, white blood cell count, and thyroid  function (TSH 0.59). No anemia and stable kidney function.  She takes medication for hypothyroidism and uses Pepcid  or its generic form as needed for reflux, which she manages by avoiding fatty and fried foods. She also takes a standard multivitamin for women over fifty.  She lives alone and reports being a good sleeper, waking up feeling 'pretty good' despite being a 'slow starter'. She is sensitive to caffeine, limiting her intake to one cup of half-caffeinated coffee in the morning to avoid diarrhea. She expresses stress related to decluttering her home, which she  finds overwhelming due to the volume of items accumulated over the years.    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   Secondary hyperparathyroidism of renal origin (HCC) 07/18/2017   Osteopenia after menopause 08/15/2016   Reflux esophagitis 06/23/2014   Dyslipidemia 06/23/2014   Palpitations 06/23/2014   Chronic kidney disease (CKD), stage III (moderate) (HCC) 08/24/2009   History of diverticulitis 05/14/2008   Adult hypothyroidism 12/04/2006   Migraine without aura and without status migrainosus, not intractable 08/02/2006   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    Past Surgical History:  Procedure Laterality Date   APPENDECTOMY     AXILLARY LYMPH NODE DISSECTION Left 05/27/2022   Procedure: AXILLARY LYMPH NODE DISSECTION w/ SN;  Surgeon: Tye Millet, DO;  Location: [facility];  Service: General;  Laterality: Left;   BASAL CELL CARCINOMA EXCISION     BASAL CELL CARCINOMA EXCISION  2014   nasal   BREAST BIOPSY Left 04/08/2022   us  biopsy 2 areas/ coil clip 4 oc, 4:30 venus clip/ path pending   BREAST BIOPSY Left 04/08/2022   US  LT BREAST BX W LOC DEV 1ST LESION IMG BX SPEC US  GUIDE 04/08/2022 [facility]   BREAST BIOPSY Left 04/08/2022  US  LT BREAST BX W LOC DEV EA ADD LESION IMG BX SPEC US  GUIDE 04/08/2022 [facility]   BREAST BIOPSY  04/27/2022   MM LT RADIO FREQUENCY TAG EA ADD LESION LOC MAMMO GUIDE 04/27/2022 [facility]   BREAST LUMPECTOMY Left 04/2022    CATARACT EXTRACTION W/PHACO Left 07/03/2019   Procedure: CATARACT EXTRACTION PHACO AND INTRAOCULAR LENS PLACEMENT (IOC) LEFT KAHOOK DUAL BLADE GONIOTOMY 9.84 01:14.5 13.2%;  Surgeon: Mittie Gaskin, MD;  Location: [facility];  Service: Ophthalmology;  Laterality: Left;   CATARACT EXTRACTION W/PHACO Right 08/07/2019   Procedure: CATARACT EXTRACTION PHACO AND INTRAOCULAR LENS PLACEMENT (IOC) RIGHT KAHOOK DUAL BLADE GONIOTOMY;  Surgeon: Mittie Gaskin, MD;  Location: [facility];  Service: Ophthalmology;  Laterality: Right;  6.46 0:59.4 10.9%   CHOLECYSTECTOMY     COLONOSCOPY     ORIF PATELLA Right 08/15/2015   Procedure: OPEN REDUCTION INTERNAL (ORIF) FIXATION PATELLA  and application wound vac;  Surgeon: Norleen JINNY Maltos, MD;  Location: [facility];  Service: Orthopedics;  Laterality: Right;   TONSILLECTOMY     VAGINAL HYSTERECTOMY      Family History  Problem Relation Age of Onset   Heart attack Father        Father had 2 heart attacks, 1 fatal   Heart disease Sister        Diabetes; dialysis for 14 months, heart attacks   Kidney disease Sister    Diabetes Sister    Heart attack Brother        Juvenile onset diabetes; died at 81 of heart failure   Diabetes Brother    Heart attack Brother    Breast cancer Neg Hx     Social History   Tobacco Use   Smoking status: Never   Smokeless tobacco: Never   Tobacco comments:    smoking cessation materials not required  Substance Use Topics   Alcohol use: No     Current Outpatient Medications:    acetaminophen  (TYLENOL ) 500 MG tablet, Take 500 mg by mouth daily as needed., Disp: , Rfl:    cholecalciferol (VITAMIN D ) 1000 units tablet, Take 1 capsule by mouth once a week., Disp: , Rfl:    famotidine  (PEPCID ) 40 MG tablet, TAKE 1 TABLET BY MOUTH EVERY DAY, Disp: 90 tablet, Rfl: 1   ipratropium (ATROVENT ) 0.06 % nasal spray, PLACE 2 SPRAYS INTO BOTH NOSTRILS 4 TIMES DAILY., Disp: 100 mL, Rfl: 1   irbesartan   (AVAPRO ) 75 MG tablet, TAKE 1 TABLET (75 MG TOTAL) BY MOUTH EVERY EVENING., Disp: 90 tablet, Rfl: 0   Multiple Vitamin (MULTIVITAMIN) tablet, Take 1 tablet by mouth daily., Disp: , Rfl:    pravastatin  (PRAVACHOL ) 80 MG tablet, TAKE 1 TABLET BY MOUTH EVERY DAY IN THE MORNING, Disp: 90 tablet, Rfl: 0   SYNTHROID  75 MCG tablet, TAKE 1 TABLET (75 MCG TOTAL) BY MOUTH DAILY BEFORE BREAKFAST. BRAND NAME ONLY, Disp: 90 tablet, Rfl: 0   timolol  (TIMOPTIC ) 0.5 % ophthalmic solution, Place 1 drop into both eyes 2 (two) times daily., Disp: , Rfl:    Zoledronic Acid (ZOMETA) 4 MG/100ML IVPB, Inject 4 mg into the vein every 6 (six) months., Disp: , Rfl:   Allergies  Allergen Reactions   Codeine Other (See Comments)    Patient had insomnia and nausea   Oxycodone  Other (See Comments)    Made her Loopy after surgery     I personally reviewed active problem list, medication list, allergies with the patient/caregiver today.   ROS  Ten systems  reviewed and is negative except as mentioned in HPI    Objective Physical Exam CONSTITUTIONAL: Patient appears well-developed and well-nourished. No distress. HEENT: Head atraumatic, normocephalic, neck supple. CARDIOVASCULAR: Normal rate, regular rhythm and normal heart sounds. No murmur heard. No edema in extremities. PULMONARY: Effort normal and breath sounds normal. No respiratory distress. ABDOMINAL: There is no tenderness or distention. MUSCULOSKELETAL: Normal gait. Without gross motor or sensory deficit. PSYCHIATRIC: Patient has a normal mood and affect. Behavior is normal. Judgment and thought content normal.  Vitals:   08/21/23 1459  BP: 130/74  Pulse: 92  Resp: 16  SpO2: 98%  Weight: 155 lb 4.8 oz (70.4 kg)  Height: 5' 4 (1.626 m)    Body mass index is 26.66 kg/m.    PHQ2/9:    08/21/2023    2:52 PM 05/15/2023   11:08 AM 11/04/2022   11:13 AM 10/25/2022    9:15 AM 03/29/2022    9:34 AM  Depression screen PHQ 2/9  Decreased Interest  0 0 0 0 0  Down, Depressed, Hopeless 0 0 0 0 0  PHQ - 2 Score 0 0 0 0 0  Altered sleeping    0 0  Tired, decreased energy    0 0  Change in appetite    0 0  Feeling bad or failure about yourself     0 0  Trouble concentrating    0 0  Moving slowly or fidgety/restless    0 0  Suicidal thoughts    0 0  PHQ-9 Score    0 0    phq 9 is negative  Fall Risk:    08/21/2023    2:52 PM 05/15/2023   11:08 AM 11/04/2022   10:56 AM 10/25/2022    9:15 AM 03/29/2022    9:34 AM  Fall Risk   Falls in the past year? 0 0 0 0 0  Number falls in past yr: 0 0 0  0  Injury with Fall? 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 Labs normal. Discussed potential benefits of sertraline  for energy and mood improvement. - Prescribe sertraline  25 mg daily, increase to 50 mg as tolerated. - Refer to cardiologist for evaluation of fatigue and possible cardiac issues. - Encourage gradual increase in physical activity. - Discuss family support for decluttering and emotional support.  Chronic kidney disease stage 3a with secondary hyperparathytoidism Well-managed.  History of breast cancer, left breast, status post lumpectomy and radiation No current treatment. Scheduled for follow-up with oncologist. - Continue follow-up with oncologist for Zemaira infusion.  Osteopenia Managed with Zometa infusions, potentially reducing breast cancer recurrence. - Continue Zometa  infusions as scheduled.  Hypothyroidism Well-controlled with normal TSH levels. - Continue current thyroid   medication regimen.  Gastroesophageal reflux disease (GERD) Symptoms well-controlled with Pepcid . - Continue Pepcid  as needed.

## 2023-08-28 DIAGNOSIS — Z17421 Hormone receptor negative with human epidermal growth factor receptor 2 negative status: Secondary | ICD-10-CM | POA: Diagnosis not present

## 2023-08-28 DIAGNOSIS — Z7983 Long term (current) use of bisphosphonates: Secondary | ICD-10-CM | POA: Diagnosis not present

## 2023-08-28 DIAGNOSIS — C50919 Malignant neoplasm of unspecified site of unspecified female breast: Secondary | ICD-10-CM | POA: Diagnosis not present

## 2023-08-28 DIAGNOSIS — M545 Low back pain, unspecified: Secondary | ICD-10-CM | POA: Diagnosis not present

## 2023-08-28 DIAGNOSIS — Z1231 Encounter for screening mammogram for malignant neoplasm of breast: Secondary | ICD-10-CM | POA: Diagnosis not present

## 2023-08-28 DIAGNOSIS — C50512 Malignant neoplasm of lower-outer quadrant of left female breast: Secondary | ICD-10-CM | POA: Diagnosis not present

## 2023-08-28 DIAGNOSIS — Z5181 Encounter for therapeutic drug level monitoring: Secondary | ICD-10-CM | POA: Diagnosis not present

## 2023-08-28 DIAGNOSIS — Z171 Estrogen receptor negative status [ER-]: Secondary | ICD-10-CM | POA: Diagnosis not present

## 2023-08-31 ENCOUNTER — Ambulatory Visit: Attending: Cardiology | Admitting: Cardiology

## 2023-08-31 ENCOUNTER — Encounter: Payer: Self-pay | Admitting: Cardiology

## 2023-08-31 VITALS — BP 120/60 | HR 69 | Ht 64.0 in | Wt 155.8 lb

## 2023-08-31 DIAGNOSIS — N1831 Chronic kidney disease, stage 3a: Secondary | ICD-10-CM

## 2023-08-31 DIAGNOSIS — E785 Hyperlipidemia, unspecified: Secondary | ICD-10-CM | POA: Diagnosis not present

## 2023-08-31 DIAGNOSIS — R0602 Shortness of breath: Secondary | ICD-10-CM

## 2023-08-31 DIAGNOSIS — I340 Nonrheumatic mitral (valve) insufficiency: Secondary | ICD-10-CM | POA: Diagnosis not present

## 2023-08-31 DIAGNOSIS — I10 Essential (primary) hypertension: Secondary | ICD-10-CM

## 2023-08-31 DIAGNOSIS — R5383 Other fatigue: Secondary | ICD-10-CM | POA: Diagnosis not present

## 2023-08-31 NOTE — Patient Instructions (Signed)
 Medication Instructions:  Your physician recommends that you continue on your current medications as directed. Please refer to the Current Medication list given to you today.   *If you need a refill on your cardiac medications before your next appointment, please call your pharmacy*  Lab Work: No labs ordered today  If you have labs (blood work) drawn today and your tests are completely normal, you will receive your results only by: MyChart Message (if you have MyChart) OR A paper copy in the mail If you have any lab test that is abnormal or we need to change your treatment, we will call you to review the results.  Testing/Procedures: Your physician has requested that you have an echocardiogram. Echocardiography is a painless test that uses sound waves to create images of your heart. It provides your doctor with information about the size and shape of your heart and how well your heart's chambers and valves are working.   You may receive an ultrasound enhancing agent through an IV if needed to better visualize your heart during the echo. This procedure takes approximately one hour.  There are no restrictions for this procedure.  This will take place at [address] [facility] [address], [place] [address]  Please note: We ask at that you not bring children with you during ultrasound (echo/ vascular) testing. Due to room size and safety concerns, children are not allowed in the ultrasound rooms during exams. Our front office staff cannot provide observation of children in our lobby area while testing is being conducted. An adult accompanying a patient to their appointment will only be allowed in the ultrasound room at the discretion of the ultrasound technician under special circumstances. We apologize for any inconvenience.   Follow-Up: At [facility], you and your health needs are our priority.  As part of our continuing mission to provide you with exceptional heart  care, our providers are all part of one team.  This team includes your primary Cardiologist (physician) and Advanced Practice Providers or APPs (Physician Assistants and Nurse Practitioners) who all work together to provide you with the care you need, when you need it.  Your next appointment:   2 month(s)  Provider:   You may see Timothy Gollan, MD or one of the following Advanced Practice Providers on your designated Care Team:   Lonni Meager, NP Lesley Maffucci, PA-C Bernardino Bring, PA-C Cadence [place], PA-C Tylene Lunch, NP Barnie Hila, NP

## 2023-08-31 NOTE — Progress Notes (Signed)
 Cardiology Office Note   Date:  08/31/2023  ID:  Theresa Hall, Theresa Hall 1940-11-18, MRN [id number] PCP: Glenard Mire, MD  [facility] Providers Cardiologist:  Evalene Lunger, MD Cardiology APP:  Gerard Frederick, NP     History of Present Illness Theresa Hall is a 83 y.o. female with a past medical history of chronic kidney disease stage IIIa, essential hypertension, chronic shortness of breath, fatigue, GERD, hyperlipidemia, migraines, osteoarthritis, hypothyroidism, breast cancer, and vertigo, who is here today for follow-up.  Previously was evaluated by Dr. Gollan 04/07/2021 after being referred from her primary care provider with complaint of shortness of breath with climbing stairs or walking and decreased fatigue from 2 to 3 months prior.  She reported to have any increased shortness of breath, fatigue with housework.  Concerned about her strong family history of coronary disease.  She reports mild weight gain over the past several years but review of past medical records revealed no significant change in her weight.  Review of CT abdomen pelvis revealed minimal aortic atherosclerosis and no iliac atherosclerosis noted.  She was scheduled for an echocardiogram to evaluate her baseline.  It was recommended she proceed with echo if normal no further workup.  If any worsening symptoms additional studies could be ordered such as a coronary CTA.  There were no medication changes that were made.  Echocardiogram that was completed revealed an LVEF of 60 to 65%, no RWMA, G1 DD, mild to moderate mitral valve regurgitation.    She returns to clinic today with increased fatigue and progressive shortness of breath.  She states that she is winded and fatigued with minimal exertion on minimal activities.  She states she had been complaining about had quite some time with her PCP.  She was also stating that she continues to have lots of things that need to be completely work that she is unable to  complete which is unacceptable.  She was recently started on sertraline  by her PCP but she had not actually started the medication at this point.  She states she has been compliant with her current medication regimen and denies any hospitalizations or visits to the emergency department.  ROS: 10 point review of systems has been reviewed and considered negative except what is listed in the HPI  Studies Reviewed EKG Interpretation Date/Time:  Thursday August 31 2023 11:24:56 EDT Ventricular Rate:  69 PR Interval:  156 QRS Duration:  86 QT Interval:  390 QTC Calculation: 417 R Axis:   -4  Text Interpretation: Normal sinus rhythm Low voltage QRS When compared with ECG of 04-May-2022 13:06, No significant change was found Confirmed by Gerard Frederick ([id number]) on 08/31/2023 11:34:55 AM    2D echo 04/23/2021 1. Left ventricular ejection fraction, by estimation, is 60 to 65%. The  left ventricle has normal function. The left ventricle has no regional  wall motion abnormalities. Left ventricular diastolic parameters are  consistent with Grade I diastolic  dysfunction (impaired relaxation).   2. Right ventricular systolic function is normal. The right ventricular  size is normal. There is normal pulmonary artery systolic pressure. The  estimated right ventricular systolic pressure is 26.3 mmHg.   3. The mitral valve is normal in structure. Mild to moderate mitral valve  regurgitation. No evidence of mitral stenosis.   4. The aortic valve was not well visualized. There is moderate  calcification of the aortic valve. Aortic valve regurgitation is not  visualized. Aortic valve sclerosis/calcification is present, without any  evidence of  aortic stenosis.   5. The inferior vena cava is normal in size with greater than 50%  respiratory variability, suggesting right atrial pressure of 3 mmHg.   Risk Assessment/Calculations           Physical Exam VS:  BP 120/60 (BP Location: Left Arm, Patient  Position: Sitting, Cuff Size: Normal)   Pulse 69   Ht 5' 4 (1.626 m)   Wt 155 lb 12.8 oz (70.7 kg)   LMP 01/11/1987 (Approximate)   SpO2 98%   BMI 26.74 kg/m        Wt Readings from Last 3 Encounters:  08/31/23 155 lb 12.8 oz (70.7 kg)  08/21/23 155 lb 4.8 oz (70.4 kg)  05/15/23 154 lb 6.4 oz (70 kg)    GEN: Well nourished, well developed in no acute distress NECK: No JVD; No carotid bruits CARDIAC: RRR, no murmurs, rubs, gallops RESPIRATORY:  Clear to auscultation without rales, wheezing or rhonchi  ABDOMEN: Soft, non-tender, non-distended EXTREMITIES:  No edema; No deformity   ASSESSMENT AND PLAN Progressive shortness of breath and fatigue she states has not been worsening over the last several months.  Recent echocardiogram revealed mild to moderate mitral regurgitation.  She has been scheduled for an updated echo to reevaluate valvular dysfunction.  She states that with her shortness of breath and fatigue she has a heaviness in her chest.  Minimal calcification was noted on prior CT scans.  EKG today reveals sinus rhythm with a rate of 69 with no ischemic changes noted.  Had previously discussed coronary CTA to rule out CAD as she has a strong family history that is concerning to her.  If her echocardiogram comes back with wall motion abnormalities will consider further ischemic workup.  She may benefit from Lexiscan in or cardiac PET stress setting versus coronary CTA due to age and baseline CKD stage III.  Dyslipidemia with last LDL of 95.  She has been continued on pravastatin  80 mg daily.  Ongoing management per PCP.  Hypertension with a blood pressure 120/60.  Blood pressure has been well-controlled.  She is continued on irbesartan  75 mg daily.  She has been encouraged to monitor pressures 1 to 2 hours postmedication administration as well.  Chronic kidney disease stage III with last serum creatinine 1.02.  This is at patient's current baseline.  She has been encouraged to  maintain adequate hydration and to avoid NSAIDs.  Mild to moderate mitral regurgitation noted on prior echocardiogram completed in 2023.  With progressive shortness of breath and fatigue she has been scheduled for an updated study.       Dispo: Patient to return to clinic to see MD/APP once testing is completed or sooner if needed  Signed, Jossalin Chervenak, NP

## 2023-09-01 DIAGNOSIS — D225 Melanocytic nevi of trunk: Secondary | ICD-10-CM | POA: Diagnosis not present

## 2023-09-01 DIAGNOSIS — D2272 Melanocytic nevi of left lower limb, including hip: Secondary | ICD-10-CM | POA: Diagnosis not present

## 2023-09-01 DIAGNOSIS — Z85828 Personal history of other malignant neoplasm of skin: Secondary | ICD-10-CM | POA: Diagnosis not present

## 2023-09-01 DIAGNOSIS — D2262 Melanocytic nevi of left upper limb, including shoulder: Secondary | ICD-10-CM | POA: Diagnosis not present

## 2023-09-01 DIAGNOSIS — D485 Neoplasm of uncertain behavior of skin: Secondary | ICD-10-CM | POA: Diagnosis not present

## 2023-09-01 DIAGNOSIS — L718 Other rosacea: Secondary | ICD-10-CM | POA: Diagnosis not present

## 2023-09-01 DIAGNOSIS — L821 Other seborrheic keratosis: Secondary | ICD-10-CM | POA: Diagnosis not present

## 2023-09-01 DIAGNOSIS — D2261 Melanocytic nevi of right upper limb, including shoulder: Secondary | ICD-10-CM | POA: Diagnosis not present

## 2023-09-01 DIAGNOSIS — Z85820 Personal history of malignant melanoma of skin: Secondary | ICD-10-CM | POA: Diagnosis not present

## 2023-09-14 ENCOUNTER — Other Ambulatory Visit: Payer: Self-pay | Admitting: Family Medicine

## 2023-09-14 DIAGNOSIS — F341 Dysthymic disorder: Secondary | ICD-10-CM

## 2023-09-14 NOTE — Telephone Encounter (Signed)
 Pt taking 2.5 mg daily.

## 2023-09-18 DIAGNOSIS — C50512 Malignant neoplasm of lower-outer quadrant of left female breast: Secondary | ICD-10-CM | POA: Diagnosis not present

## 2023-09-18 DIAGNOSIS — Z17421 Hormone receptor negative with human epidermal growth factor receptor 2 negative status: Secondary | ICD-10-CM | POA: Diagnosis not present

## 2023-09-18 DIAGNOSIS — M545 Low back pain, unspecified: Secondary | ICD-10-CM | POA: Diagnosis not present

## 2023-10-01 ENCOUNTER — Other Ambulatory Visit: Payer: Self-pay | Admitting: Family Medicine

## 2023-10-01 DIAGNOSIS — E785 Hyperlipidemia, unspecified: Secondary | ICD-10-CM

## 2023-10-06 ENCOUNTER — Other Ambulatory Visit: Payer: Self-pay | Admitting: Family Medicine

## 2023-10-06 DIAGNOSIS — N1831 Chronic kidney disease, stage 3a: Secondary | ICD-10-CM

## 2023-10-06 DIAGNOSIS — I129 Hypertensive chronic kidney disease with stage 1 through stage 4 chronic kidney disease, or unspecified chronic kidney disease: Secondary | ICD-10-CM

## 2023-10-06 DIAGNOSIS — I10 Essential (primary) hypertension: Secondary | ICD-10-CM

## 2023-10-06 DIAGNOSIS — E039 Hypothyroidism, unspecified: Secondary | ICD-10-CM

## 2023-10-09 DIAGNOSIS — H401131 Primary open-angle glaucoma, bilateral, mild stage: Secondary | ICD-10-CM | POA: Diagnosis not present

## 2023-10-09 DIAGNOSIS — H353131 Nonexudative age-related macular degeneration, bilateral, early dry stage: Secondary | ICD-10-CM | POA: Diagnosis not present

## 2023-10-09 DIAGNOSIS — Z961 Presence of intraocular lens: Secondary | ICD-10-CM | POA: Diagnosis not present

## 2023-10-10 ENCOUNTER — Ambulatory Visit: Payer: Self-pay | Admitting: Cardiology

## 2023-10-10 ENCOUNTER — Ambulatory Visit: Attending: Cardiology

## 2023-10-10 DIAGNOSIS — R0602 Shortness of breath: Secondary | ICD-10-CM

## 2023-10-10 DIAGNOSIS — I083 Combined rheumatic disorders of mitral, aortic and tricuspid valves: Secondary | ICD-10-CM

## 2023-10-10 LAB — ECHOCARDIOGRAM COMPLETE
AR max vel: 1.9 cm2
AV Area VTI: 1.92 cm2
AV Area mean vel: 2.05 cm2
AV Mean grad: 4 mmHg
AV Peak grad: 8.5 mmHg
Ao pk vel: 1.46 m/s
Area-P 1/2: 2.5 cm2
S' Lateral: 2.77 cm

## 2023-10-12 ENCOUNTER — Encounter: Payer: Self-pay | Admitting: Family Medicine

## 2023-10-12 ENCOUNTER — Ambulatory Visit (INDEPENDENT_AMBULATORY_CARE_PROVIDER_SITE_OTHER): Admitting: Family Medicine

## 2023-10-12 VITALS — BP 118/74 | HR 74 | Resp 16 | Ht 64.0 in | Wt 155.5 lb

## 2023-10-12 DIAGNOSIS — Z780 Asymptomatic menopausal state: Secondary | ICD-10-CM

## 2023-10-12 DIAGNOSIS — N1831 Chronic kidney disease, stage 3a: Secondary | ICD-10-CM | POA: Diagnosis not present

## 2023-10-12 DIAGNOSIS — E039 Hypothyroidism, unspecified: Secondary | ICD-10-CM | POA: Diagnosis not present

## 2023-10-12 DIAGNOSIS — R0609 Other forms of dyspnea: Secondary | ICD-10-CM

## 2023-10-12 DIAGNOSIS — F341 Dysthymic disorder: Secondary | ICD-10-CM

## 2023-10-12 DIAGNOSIS — Z23 Encounter for immunization: Secondary | ICD-10-CM

## 2023-10-12 DIAGNOSIS — M8580 Other specified disorders of bone density and structure, unspecified site: Secondary | ICD-10-CM

## 2023-10-12 DIAGNOSIS — R194 Change in bowel habit: Secondary | ICD-10-CM | POA: Diagnosis not present

## 2023-10-12 DIAGNOSIS — R5383 Other fatigue: Secondary | ICD-10-CM

## 2023-10-12 MED ORDER — BUPROPION HCL ER (XL) 150 MG PO TB24: 150.0000 mg | ORAL_TABLET | Freq: Every day | ORAL | 0 refills | Status: DC

## 2023-10-12 MED ORDER — SERTRALINE HCL 50 MG PO TABS: 50.0000 mg | ORAL_TABLET | Freq: Every day | ORAL | 0 refills | Status: DC

## 2023-10-12 NOTE — Progress Notes (Signed)
 Name: Theresa Hall   MRN: [id number]    DOB: [birth year]   Date:10/12/2023       Progress Note  Subjective  Chief Complaint  Chief Complaint  Patient presents with   Medical Management of Chronic Issues    Pt states stll feeling fatigue and no motivation but not depressed related   Discussed the use of AI scribe software for clinical note transcription with the patient, who gave verbal consent to proceed.  History of Present Illness Theresa Hall is an 83 year old female with chronic fatigue syndrome and chronic kidney disease stage 3A who presents with persistent fatigue and shortness of breath.  She experiences persistent fatigue and shortness of breath, particularly with exertion such as climbing stairs or walking. These symptoms have been ongoing for several months, since the summer, and she describes it as feeling like she has 'nothing to push with.' She can shower without significant shortness of breath, but activities like vacuuming are very limited. She has a history of chronic fatigue syndrome from the 1990s, which she feels is similar to her current symptoms, though not as severe.  She was previously started on sertraline  but reports no improvement and feels it has 'slowed her down.' She describes a lack of motivation and energy, stating she could 'just sit here' without doing anything. She is currently taking a low dose of sertraline .  She has a history of chronic kidney disease stage 3A, with her last kidney function test showing a GFR of 54.   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  She reports a change in bowel habits over the past seven to eight months, with increased frequency and episodes of diarrhea. She describes her stools as not well-formed and sometimes experiences cramping before bowel movements. She has a history of diverticulitis but has not had an  episode in many years.  An echocardiogram in September 2023 showed mild mitral valve regurgitation with no major changes. She is awaiting further evaluation, including a potential cardiac CTA.    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   Osteopenia after menopause 08/15/2016   Reflux esophagitis 06/23/2014   Dyslipidemia 06/23/2014   Palpitations 06/23/2014   Chronic kidney disease (CKD), stage III (moderate) (HCC) 08/24/2009   History of diverticulitis 05/14/2008   Adult hypothyroidism 12/04/2006   Migraine without aura and without status migrainosus, not intractable 08/02/2006   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    Past Surgical History:  Procedure Laterality Date   APPENDECTOMY     AXILLARY LYMPH NODE DISSECTION Left 05/27/2022   Procedure: AXILLARY LYMPH NODE DISSECTION w/ SN;  Surgeon: Tye Millet, DO;  Location: [facility];  Service: General;  Laterality: Left;   BASAL CELL CARCINOMA EXCISION     BASAL CELL CARCINOMA EXCISION  2014   nasal  BREAST BIOPSY Left 04/08/2022   us  biopsy 2 areas/ coil clip 4 oc, 4:30 venus clip/ path pending   BREAST BIOPSY Left 04/08/2022   US  LT BREAST BX W LOC DEV 1ST LESION IMG BX SPEC US  GUIDE 04/08/2022 [facility]   BREAST BIOPSY Left 04/08/2022   US  LT BREAST BX W LOC DEV EA ADD LESION IMG BX  SPEC US  GUIDE 04/08/2022 [facility]   BREAST BIOPSY  04/27/2022   MM LT RADIO FREQUENCY TAG EA ADD LESION LOC MAMMO GUIDE 04/27/2022 [facility]   BREAST LUMPECTOMY Left 04/2022   CATARACT EXTRACTION W/PHACO Left 07/03/2019   Procedure: CATARACT EXTRACTION PHACO AND INTRAOCULAR LENS PLACEMENT (IOC) LEFT KAHOOK DUAL BLADE GONIOTOMY 9.84 01:14.5 13.2%;  Surgeon: Mittie Gaskin, MD;  Location: [facility];  Service: Ophthalmology;  Laterality: Left;   CATARACT EXTRACTION W/PHACO Right 08/07/2019   Procedure: CATARACT EXTRACTION PHACO AND INTRAOCULAR LENS PLACEMENT (IOC) RIGHT KAHOOK DUAL BLADE GONIOTOMY;  Surgeon: Mittie Gaskin, MD;  Location: [facility];  Service: Ophthalmology;  Laterality: Right;  6.46 0:59.4 10.9%   CHOLECYSTECTOMY     COLONOSCOPY     ORIF PATELLA Right 08/15/2015   Procedure: OPEN REDUCTION INTERNAL (ORIF) FIXATION PATELLA  and application wound vac;  Surgeon: Norleen JINNY Maltos, MD;  Location: [facility];  Service: Orthopedics;  Laterality: Right;   TONSILLECTOMY     VAGINAL HYSTERECTOMY      Family History  Problem Relation Age of Onset   Heart attack Father        Father had 2 heart attacks, 1 fatal   Heart disease Sister        Diabetes; dialysis for 14 months, heart attacks   Kidney disease Sister    Diabetes Sister    Heart attack Brother        Juvenile onset diabetes; died at 46 of heart failure   Diabetes Brother    Heart attack Brother    Breast cancer Neg Hx     Social History   Tobacco Use   Smoking status: Never   Smokeless tobacco: Never   Tobacco comments:    smoking cessation materials not required  Substance Use Topics   Alcohol use: No     Current Outpatient Medications:    acetaminophen  (TYLENOL ) 500 MG tablet, Take 500 mg by mouth daily as needed., Disp: , Rfl:    cholecalciferol (VITAMIN D ) 1000 units tablet, Take 1 capsule by mouth once a week., Disp: , Rfl:    famotidine  (PEPCID ) 40 MG tablet,  TAKE 1 TABLET BY MOUTH EVERY DAY, Disp: 90 tablet, Rfl: 1   ipratropium (ATROVENT ) 0.06 % nasal spray, PLACE 2 SPRAYS INTO BOTH NOSTRILS 4 TIMES DAILY., Disp: 100 mL, Rfl: 1   irbesartan  (AVAPRO ) 75 MG tablet, TAKE 1 TABLET (75 MG TOTAL) BY MOUTH EVERY EVENING., Disp: 30 tablet, Rfl: 0   Multiple Vitamin (MULTIVITAMIN) tablet, Take 1 tablet by mouth daily., Disp: , Rfl:    pravastatin  (PRAVACHOL ) 80 MG tablet, TAKE 1 TABLET BY MOUTH EVERY DAY IN THE MORNING, Disp: 90 tablet, Rfl: 0   sertraline  (ZOLOFT ) 50 MG tablet, Take 1 tablet (50 mg total) by mouth daily. She is NOT started yet. (Taking 25 mg), Disp: 90 tablet, Rfl: 0   SYNTHROID  75 MCG tablet, TAKE 1 TABLET (75 MCG TOTAL) BY MOUTH DAILY BEFORE BREAKFAST. BRAND NAME ONLY, Disp: 30 tablet, Rfl: 0   timolol  (TIMOPTIC ) 0.5 % ophthalmic solution, Place 1 drop into both eyes 2 (two) times daily., Disp: ,  Rfl:    Zoledronic Acid (ZOMETA) 4 MG/100ML IVPB, Inject 4 mg into the vein every 6 (six) months., Disp: , Rfl:   Allergies  Allergen Reactions   Codeine Other (See Comments)    Patient had insomnia and nausea   Oxycodone  Other (See Comments)    Made her Loopy after surgery     I personally reviewed active problem list, medication list, allergies with the patient/caregiver today.   ROS  Ten systems reviewed and is negative except as mentioned in HPI    Objective Physical Exam CONSTITUTIONAL: Patient appears well-developed and well-nourished.  No distress. HEENT: Head atraumatic, normocephalic, neck supple. CARDIOVASCULAR: Normal rate, regular rhythm and normal heart sounds.  No murmur heard. No BLE edema. PULMONARY: Effort normal and breath sounds normal. No respiratory distress. ABDOMINAL: There is no tenderness or distention. MUSCULOSKELETAL: Normal gait. Without gross motor or sensory deficit. PSYCHIATRIC: Patient has a normal mood and affect. behavior is normal. Judgment and thought content normal.  Vitals:   10/12/23 0930   BP: 118/74  Pulse: 74  Resp: 16  SpO2: 98%  Weight: 155 lb 8 oz (70.5 kg)  Height: 5' 4 (1.626 m)    Body mass index is 26.69 kg/m.  Recent Results (from the past 2160 hours)  ECHOCARDIOGRAM COMPLETE     Status: None   Collection Time: 10/10/23 11:06 AM  Result Value Ref Range   AR max vel 1.90 cm2   AV Peak grad 8.5 mmHg   Ao pk vel 1.46 m/s   S' Lateral 2.77 cm   Area-P 1/2 2.50 cm2   AV Area VTI 1.92 cm2   AV Mean grad 4.0 mmHg   AV Area mean vel 2.05 cm2   Est EF 60 - 65%       PHQ2/9:    10/12/2023    9:25 AM 08/21/2023    2:52 PM 05/15/2023   11:08 AM 11/04/2022   11:13 AM 10/25/2022    9:15 AM  Depression screen PHQ 2/9  Decreased Interest 0 0 0 0 0  Down, Depressed, Hopeless 0 0 0 0 0  PHQ - 2 Score 0 0 0 0 0  Altered sleeping 0    0  Tired, decreased energy 0    0  Change in appetite 0    0  Feeling bad or failure about yourself  0    0  Trouble concentrating 0    0  Moving slowly or fidgety/restless 0    0  Suicidal thoughts 0    0  PHQ-9 Score 0    0  Difficult doing work/chores Not difficult at all        phq 9 is negative  Fall Risk:    10/12/2023    9:25 AM 08/21/2023    2:52 PM 05/15/2023   11:08 AM 11/04/2022   10:56 AM 10/25/2022    9:15 AM  Fall Risk   Falls in the past year? 0 0 0 0 0  Number falls in past yr: 0 0 0 0   Injury with Fall? 0 0 0 0   Risk for fall due to : No Fall Risks No Fall Risks No Fall Risks No Fall Risks No Fall Risks  Follow up Falls evaluation completed Falls evaluation completed Falls prevention discussed;Education provided;Falls evaluation completed Education provided;Falls prevention discussed Falls prevention discussed     Assessment & Plan Exertional fatigue and shortness of breath Echocardiogram shows mild mitral valve regurgitation without significant changes. - Check  thyroid  function, vitamin D , iron, and B12 levels. - Continue cardiology follow-up for potential cardiac PET stress test or coronary  CTA.  Dysthymia  with inadequate response to sertraline  No improvement on sertraline . Discussed switching to Wellbutrin for increased energy. Considered combination therapy with sertraline  for anxiety. Modafinil risks discussed, deferred until cardiology evaluation. - Consider combination therapy with sertraline  and Wellbutrin, if she prefers may stop sertraline  by weaning dose down to 25 mg for a couple of weeks before stopping medication - Monitor response to medication changes.  Change in bowel habits with diarrhea Increased bowel frequency and diarrhea over 7-8 months, unknown cause - Refer to gastroenterologist. - Provide stool culture kit for analysis.  Chronic kidney disease stage 3a Stable kidney function at 54, no major changes since August labs.  Mitral valve regurgitation Mild mitral valve regurgitation with persistent fatigue and shortness of breath, no major cardiac changes. - Continue monitoring with cardiologist.  Malignant neoplasm of left breast, status post radiation Post-radiation therapy completed in 2020. Recent bone scan shows no metastasis.  Adult hypothyroidism -recheck TSH  Osteopenia Ongoing Zometa infusions with dosage adjustments due to kidney function. - Continue Zometa infusions per nephrologist's recommendations.

## 2023-10-14 LAB — VITAMIN D 25 HYDROXY (VIT D DEFICIENCY, FRACTURES): Vit D, 25-Hydroxy: 52 ng/mL (ref 30–100)

## 2023-10-14 LAB — B12 AND FOLATE PANEL
Folate: >24 ng/mL
Vitamin B-12: 1073 pg/mL (ref 200–1100)

## 2023-10-14 LAB — IRON,TIBC AND FERRITIN PANEL
%SAT: 26 % (ref 16–45)
Ferritin: 137 ng/mL (ref 16–288)
Iron: 81 ug/dL (ref 45–160)
TIBC: 308 ug/dL (ref 250–450)

## 2023-10-14 LAB — TSH: TSH: 0.64 m[IU]/L (ref 0.40–4.50)

## 2023-10-16 LAB — GASTROINTESTINAL PATHOGEN PNL
CampyloBacter Group: NOT DETECTED
Norovirus GI/GII: NOT DETECTED
Rotavirus A: NOT DETECTED
Salmonella species: NOT DETECTED
Shiga Toxin 1: NOT DETECTED
Shiga Toxin 2: NOT DETECTED
Shigella Species: NOT DETECTED
Vibrio Group: NOT DETECTED
Yersinia enterocolitica: NOT DETECTED

## 2023-10-18 ENCOUNTER — Ambulatory Visit: Payer: Self-pay | Admitting: Family Medicine

## 2023-10-23 ENCOUNTER — Other Ambulatory Visit: Payer: Self-pay | Admitting: Family Medicine

## 2023-10-23 DIAGNOSIS — E039 Hypothyroidism, unspecified: Secondary | ICD-10-CM

## 2023-10-23 DIAGNOSIS — N1831 Chronic kidney disease, stage 3a: Secondary | ICD-10-CM

## 2023-10-23 DIAGNOSIS — K219 Gastro-esophageal reflux disease without esophagitis: Secondary | ICD-10-CM

## 2023-10-23 DIAGNOSIS — I10 Essential (primary) hypertension: Secondary | ICD-10-CM

## 2023-10-23 DIAGNOSIS — I129 Hypertensive chronic kidney disease with stage 1 through stage 4 chronic kidney disease, or unspecified chronic kidney disease: Secondary | ICD-10-CM

## 2023-10-24 ENCOUNTER — Telehealth: Payer: Self-pay | Admitting: Family Medicine

## 2023-10-24 ENCOUNTER — Telehealth: Payer: Self-pay

## 2023-10-24 NOTE — Telephone Encounter (Signed)
 Copied from CRM [id number]. Topic: Clinical - Lab/Test Results >> Oct 24, 2023 11:11 AM Theresa Hall wrote: Reason for CRM: The patient called in stating she would like for someone to call her to go over her recent blood work. She has been having difficulty getting into her my chart but I reset her password for her so hopefully she can get back in. She also states she would like a hard copy of her results mailed to her house. Please assist patient further

## 2023-10-24 NOTE — Telephone Encounter (Signed)
 Copied from CRM [id number]. Topic: Referral - Question >> Oct 24, 2023 11:15 AM Selinda RAMAN wrote: Reason for CRM: The patient states she cannot get in to see the Gastroenterologist until January and wants to know if the provider recommended she wait for that appointment or be seen somewhere else. Please assist patient further.

## 2023-10-24 NOTE — Telephone Encounter (Signed)
 Mailing results to address. Advised pt specialties departments are booked out in advance to please call [facility] to get on wait list may help with getting soon quicker.

## 2023-10-25 ENCOUNTER — Other Ambulatory Visit: Payer: Self-pay | Admitting: Family Medicine

## 2023-10-25 DIAGNOSIS — R197 Diarrhea, unspecified: Secondary | ICD-10-CM

## 2023-10-25 DIAGNOSIS — R194 Change in bowel habit: Secondary | ICD-10-CM

## 2023-10-31 ENCOUNTER — Ambulatory Visit: Attending: Cardiology | Admitting: Cardiology

## 2023-10-31 ENCOUNTER — Encounter: Payer: Self-pay | Admitting: Cardiology

## 2023-10-31 VITALS — BP 115/60 | HR 61 | Ht 64.0 in | Wt 156.0 lb

## 2023-10-31 DIAGNOSIS — I2089 Other forms of angina pectoris: Secondary | ICD-10-CM

## 2023-10-31 DIAGNOSIS — I10 Essential (primary) hypertension: Secondary | ICD-10-CM

## 2023-10-31 DIAGNOSIS — N1831 Chronic kidney disease, stage 3a: Secondary | ICD-10-CM | POA: Diagnosis not present

## 2023-10-31 DIAGNOSIS — E785 Hyperlipidemia, unspecified: Secondary | ICD-10-CM

## 2023-10-31 DIAGNOSIS — R5383 Other fatigue: Secondary | ICD-10-CM | POA: Diagnosis not present

## 2023-10-31 DIAGNOSIS — R0602 Shortness of breath: Secondary | ICD-10-CM | POA: Diagnosis not present

## 2023-10-31 DIAGNOSIS — I340 Nonrheumatic mitral (valve) insufficiency: Secondary | ICD-10-CM | POA: Diagnosis not present

## 2023-10-31 NOTE — Patient Instructions (Signed)
 Medication Instructions:  Your physician recommends that you continue on your current medications as directed. Please refer to the Current Medication list given to you today.   *If you need a refill on your cardiac medications before your next appointment, please call your pharmacy*  Lab Work: No labs ordered today  If you have labs (blood work) drawn today and your tests are completely normal, you will receive your results only by: MyChart Message (if you have MyChart) OR A paper copy in the mail If you have any lab test that is abnormal or we need to change your treatment, we will call you to review the results.  Testing/Procedures:   Please report to Radiology at [facility] Entr[facility], medical mall, 30 mins prior to your test.  [address]  [place], [place]  How to Prepare for Your Cardiac PET/CT Stress Test:  Nothing to eat or drink, except water , 3 hours prior to arrival time.  NO caffeine/decaffeinated products, or chocolate 12 hours prior to arrival. (Please note decaffeinated beverages (teas/coffees) still contain caffeine).  If you have caffeine within 12 hours prior, the test will need to be rescheduled.  Medication instructions: Do not take erectile dysfunction medications for 72 hours prior to test (sildenafil, tadalafil) Do not take nitrates (isosorbide mononitrate, Ranexa) the day before or day of test Do not take tamsulosin the day before or morning of test Hold theophylline containing medications for 12 hours. Hold Dipyridamole 48 hours prior to the test.  Diabetic Preparation: If able to eat breakfast prior to 3 hour fasting, you may take all medications, including your insulin. Do not worry if you miss your breakfast dose of insulin - start at your next meal. If you do not eat prior to 3 hour fast-Hold all diabetes (oral and insulin) medications. Patients who wear a continuous glucose monitor MUST remove the device prior to  scanning.  You may take your remaining medications with water .  NO perfume, cologne or lotion on chest or abdomen area. FEMALES - Please avoid wearing dresses to this appointment.  Total time is 1 to 2 hours; you may want to bring reading material for the waiting time.  IF YOU THINK YOU MAY BE PREGNANT, OR ARE NURSING PLEASE INFORM THE TECHNOLOGIST.  In preparation for your appointment, medication and supplies will be purchased.  Appointment availability is limited, so if you need to cancel or reschedule, please call the Radiology Department Scheduler at [phone] 24 hours in advance to avoid a cancellation fee of $100.00  What to Expect When you Arrive:  Once you arrive and check in for your appointment, you will be taken to a preparation room within the Radiology Department.  A technologist or Nurse will obtain your medical history, verify that you are correctly prepped for the exam, and explain the procedure.  Afterwards, an IV will be started in your arm and electrodes will be placed on your skin for EKG monitoring during the stress portion of the exam. Then you will be escorted to the PET/CT scanner.  There, staff will get you positioned on the scanner and obtain a blood pressure and EKG.  During the exam, you will continue to be connected to the EKG and blood pressure machines.  A small, safe amount of a radioactive tracer will be injected in your IV to obtain a series of pictures of your heart along with an injection of a stress agent.    After your Exam:  It is recommended that  you eat a meal and drink a caffeinated beverage to counter act any effects of the stress agent.  Drink plenty of fluids for the remainder of the day and urinate frequently for the first couple of hours after the exam.  Your doctor will inform you of your test results within 7-10 business days.  For more information and frequently asked questions, please visit our  website: [email or web address]  For questions about your test or how to prepare for your test, please call: Cardiac Imaging Nurse Navigators Office: [phone]   Follow-Up: At [facility], you and your health needs are our priority.  As part of our continuing mission to provide you with exceptional heart care, our providers are all part of one team.  This team includes your primary Cardiologist (physician) and Advanced Practice Providers or APPs (Physician Assistants and Nurse Practitioners) who all work together to provide you with the care you need, when you need it.  Your next appointment:   4 week(s)  Provider:   You may see Timothy Gollan, MD or one of the following Advanced Practice Providers on your designated Care Team:   Tylene Lunch, NP

## 2023-10-31 NOTE — Progress Notes (Unsigned)
 Cardiology Office Note   Date:  10/31/2023  ID:  Melodee, Lupe Jun 04, 1940, MRN [id number] PCP: Glenard Mire, MD  [facility] Providers Cardiologist:  Evalene Lunger, MD Cardiology APP:  Gerard Frederick, NP { Click to update primary MD,subspecialty MD or APP then REFRESH:1}    History of Present Illness AIMAN SONN is a 83 y.o. female with past medical history of chronic kidney disease stage IIIa, primary hypertension, chronic shortness of breath, fatigue, GERD, hyperlipidemia, migraines, osteoarthritis, hypothyroidism, breast cancer, vertigo, who is here today for follow-up.   Previously was evaluated by Dr. Gollan 04/07/2021 after being referred from her primary care provider with complaint of shortness of breath with climbing stairs or walking and decreased fatigue from 2 to 3 months prior.  She reported to have any increased shortness of breath, fatigue with housework.  Concerned about her strong family history of coronary disease.  She reports mild weight gain over the past several years but review of past medical records revealed no significant change in her weight.  Review of CT abdomen pelvis revealed minimal aortic atherosclerosis and no iliac atherosclerosis noted.  She was scheduled for an echocardiogram to evaluate her baseline.  It was recommended she proceed with echo if normal no further workup.  If any worsening symptoms additional studies could be ordered such as a coronary CTA.  There were no medication changes that were made.  Echocardiogram that was completed revealed an LVEF of 60 to 65%, no RWMA, G1 DD, mild to moderate mitral valve regurgitation.     She was last seen in clinic 08/31/2023 with increased fatigue and progressive shortness of breath.  States she was feeling fatigued with minimal exertion or minimal activities.  States she been complaining about for quite some time with her PCP.  She was scheduled for an updated echocardiogram when she had wall  motion abnormalities.  Explained that she would benefit from a Lexiscan or cardiac PET stress versus a coronary CTA due to age and baseline CKD stage III.  She returns to clinic today  ROS: 10 point review of systems has been reviewed and considered negative the exception was been listed in the HPI  Studies Reviewed EKG Interpretation Date/Time:  Tuesday October 31 2023 10:49:24 EDT Ventricular Rate:  61 PR Interval:  150 QRS Duration:  76 QT Interval:  402 QTC Calculation: 404 R Axis:   6  Text Interpretation: Normal sinus rhythm Low voltage QRS When compared with ECG of 31-Aug-2023 11:24, No significant change was found Confirmed by Gerard Frederick ([id number]) on 10/31/2023 10:54:00 AM   2d echo 10/10/2023 1. Left ventricular ejection fraction, by estimation, is 60 to 65%. The  left ventricle has normal function. Left ventricular endocardial border  not optimally defined to evaluate regional wall motion. Left ventricular  diastolic parameters were normal.  The average left ventricular global longitudinal strain is -16.9 %. The  global longitudinal strain is normal.   2. Right ventricular systolic function is normal. The right ventricular  size is not well visualized. There is normal pulmonary artery systolic  pressure.   3. The mitral valve is degenerative. Mild mitral valve regurgitation. No  evidence of mitral stenosis.   4. Tricuspid valve regurgitation is mild to moderate.   5. The aortic valve is tricuspid. Aortic valve regurgitation is not  visualized. Aortic valve sclerosis/calcification is present, without any  evidence of aortic stenosis.   6. The inferior vena cava is normal in size with greater than 50%  respiratory variability, suggesting right atrial pressure of 3 mmHg.   2D echo 04/23/2021 1. Left ventricular ejection fraction, by estimation, is 60 to 65%. The  left ventricle has normal function. The left ventricle has no regional  wall motion abnormalities. Left  ventricular diastolic parameters are  consistent with Grade I diastolic  dysfunction (impaired relaxation).   2. Right ventricular systolic function is normal. The right ventricular  size is normal. There is normal pulmonary artery systolic pressure. The  estimated right ventricular systolic pressure is 26.3 mmHg.   3. The mitral valve is normal in structure. Mild to moderate mitral valve  regurgitation. No evidence of mitral stenosis.   4. The aortic valve was not well visualized. There is moderate  calcification of the aortic valve. Aortic valve regurgitation is not  visualized. Aortic valve sclerosis/calcification is present, without any  evidence of aortic stenosis.   5. The inferior vena cava is normal in size with greater than 50%  respiratory variability, suggesting right atrial pressure of 3 mmHg.  Risk Assessment/Calculations           Physical Exam VS:  BP 115/60 (BP Location: Left Arm, Patient Position: Sitting, Cuff Size: Normal)   Pulse 61 Comment: 65 oximeter  Ht 5' 4 (1.626 m)   Wt 156 lb (70.8 kg)   LMP 01/11/1987 (Approximate)   SpO2 98%   BMI 26.78 kg/m        Wt Readings from Last 3 Encounters:  10/31/23 156 lb (70.8 kg)  10/12/23 155 lb 8 oz (70.5 kg)  08/31/23 155 lb 12.8 oz (70.7 kg)    GEN: Well nourished, well developed in no acute distress NECK: No JVD; No carotid bruits CARDIAC: ***RRR, no murmurs, rubs, gallops RESPIRATORY:  Clear to auscultation without rales, wheezing or rhonchi  ABDOMEN: Soft, non-tender, non-distended EXTREMITIES:  No edema; No deformity   ASSESSMENT AND PLAN Progressive shortness of breath and fatigue Dyslipidemia Hypertension Chronic kidney disease stage III Mild to moderate mitral regurgitation    {Are you ordering a CV Procedure (e.g. stress test, cath, DCCV, TEE, etc)?   Press F2        :[id number]}  Dispo: ***  Signed, Romani Wilbon, NP

## 2023-11-01 DIAGNOSIS — R197 Diarrhea, unspecified: Secondary | ICD-10-CM | POA: Diagnosis not present

## 2023-11-02 LAB — C. DIFFICILE GDH AND TOXIN A/B
GDH ANTIGEN: NOT DETECTED
MICRO NUMBER:: 17137314
SPECIMEN QUALITY:: ADEQUATE
TOXIN A AND B: NOT DETECTED

## 2023-11-06 ENCOUNTER — Ambulatory Visit: Payer: Self-pay | Admitting: Family Medicine

## 2023-11-06 ENCOUNTER — Telehealth: Payer: Self-pay

## 2023-11-06 NOTE — Telephone Encounter (Signed)
 PCP has not reviewed yet

## 2023-11-06 NOTE — Telephone Encounter (Signed)
 Copied from CRM [id number]. Topic: Clinical - Lab/Test Results >> Nov 06, 2023  3:03 PM Pinkey ORN wrote: Reason for CRM: Lab Results >> Nov 06, 2023  3:03 PM Pinkey ORN wrote: Patient is requesting a call back to go over her results from her stool sample. Patient states she's unable to get into her mychart account.

## 2023-11-07 NOTE — Telephone Encounter (Signed)
 Pt notified of results will call to see if can get sooner appt w/ symptoms and if not will notify us  back.

## 2023-11-08 NOTE — Telephone Encounter (Signed)
 called

## 2023-11-08 NOTE — Telephone Encounter (Signed)
 Copied from CRM [id number]. Topic: Referral - Question >> Nov 08, 2023 10:33 AM Tonda B wrote: Reason for CRM: pt is calling has questions about her referral for   Gastroenterology please call pt back [phone] (H) [phone] (M)

## 2023-11-09 NOTE — Telephone Encounter (Signed)
 Pt notified, and called to schedule appointment they stated they had not heard anything from Dr. Unk to say schedule earlier.  I called and they told me the same as well.  They stated her Nurse would speak w/ Dr. Unk to clarify and get in touch with patient.  Please clarify, and call patient with appointment.  Thanks

## 2023-11-13 ENCOUNTER — Other Ambulatory Visit: Payer: Self-pay | Admitting: Family Medicine

## 2023-11-13 DIAGNOSIS — E039 Hypothyroidism, unspecified: Secondary | ICD-10-CM

## 2023-11-13 DIAGNOSIS — I10 Essential (primary) hypertension: Secondary | ICD-10-CM

## 2023-11-13 DIAGNOSIS — N1831 Chronic kidney disease, stage 3a: Secondary | ICD-10-CM

## 2023-11-13 DIAGNOSIS — I129 Hypertensive chronic kidney disease with stage 1 through stage 4 chronic kidney disease, or unspecified chronic kidney disease: Secondary | ICD-10-CM

## 2023-11-16 ENCOUNTER — Ambulatory Visit: Payer: Self-pay

## 2023-11-16 DIAGNOSIS — Z0000 Encounter for general adult medical examination without abnormal findings: Secondary | ICD-10-CM | POA: Diagnosis not present

## 2023-11-16 DIAGNOSIS — K529 Noninfective gastroenteritis and colitis, unspecified: Secondary | ICD-10-CM | POA: Diagnosis not present

## 2023-11-16 NOTE — Progress Notes (Signed)
 Subjective:   Theresa Hall is a 83 y.o. female who presents for a Medicare Annual Wellness Visit.  I connected with  Theresa Hall on 11/16/23 by a audio enabled telemedicine application and verified that I am speaking with the correct person using two identifiers.  Patient Location: Home  Provider Location: Office/Clinic  I discussed the limitations of evaluation and management by telemedicine. The patient expressed understanding and agreed to proceed.   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   Cancer (HCC) 2006   melanoma on shoulder    Chronic kidney disease, stage III (moderate) (HCC)    Diverticulitis    Diverticulitis of colon (without mention of hemorrhage)(562.11)    GERD (gastroesophageal reflux disease)    Glaucoma    Hypertension    Invasive carcinoma of breast (HCC)    Migraine without aura, without mention of intractable migraine without mention of status migrainosus    Osteoarthritis    Osteopenia    Other abnormal glucose    Other and unspecified hyperlipidemia    Palpitations    PONV (postoperative nausea and vomiting)    has used scopolamine  patches   Postablative ovarian failure    Pure hypercholesterolemia    Secondary hyperparathyroidism of renal origin    Unspecified glaucoma(365.9)    Unspecified hypothyroidism    Vertigo    none for over 2 yrs   Past Surgical History:  Procedure Laterality Date   APPENDECTOMY     AXILLARY LYMPH NODE DISSECTION Left 05/27/2022   Procedure: AXILLARY LYMPH NODE DISSECTION w/ SN;  Surgeon: Tye Millet, DO;  Location: [facility];  Service: General;  Laterality: Left;   BASAL CELL CARCINOMA EXCISION     BASAL CELL CARCINOMA EXCISION  2014   nasal   BREAST BIOPSY Left 04/08/2022   us  biopsy 2  areas/ coil clip 4 oc, 4:30 venus clip/ path pending   BREAST BIOPSY Left 04/08/2022   US  LT BREAST BX W LOC DEV 1ST LESION IMG BX SPEC US  GUIDE 04/08/2022 [facility]   BREAST BIOPSY Left 04/08/2022   US  LT BREAST BX W LOC DEV EA ADD LESION IMG BX SPEC US  GUIDE 04/08/2022 [facility]   BREAST BIOPSY  04/27/2022   MM LT RADIO FREQUENCY TAG EA ADD LESION LOC MAMMO GUIDE 04/27/2022 [facility]   BREAST LUMPECTOMY Left 04/2022   CATARACT EXTRACTION W/PHACO Left 07/03/2019   Procedure: CATARACT EXTRACTION PHACO AND INTRAOCULAR LENS PLACEMENT (IOC) LEFT KAHOOK DUAL BLADE GONIOTOMY 9.84 01:14.5 13.2%;  Surgeon: Mittie Gaskin, MD;  Location: [facility];  Service: Ophthalmology;  Laterality: Left;   CATARACT EXTRACTION W/PHACO Right 08/07/2019   Procedure: CATARACT EXTRACTION PHACO AND INTRAOCULAR LENS PLACEMENT (IOC) RIGHT KAHOOK DUAL BLADE GONIOTOMY;  Surgeon: Mittie Gaskin, MD;  Location: [facility];  Service: Ophthalmology;  Laterality: Right;  6.46 0:59.4 10.9%   CHOLECYSTECTOMY     COLONOSCOPY     ORIF PATELLA Right 08/15/2015   Procedure: OPEN REDUCTION INTERNAL (ORIF) FIXATION PATELLA  and application wound vac;  Surgeon: Norleen JINNY Maltos, MD;  Location: [facility];  Service: Orthopedics;  Laterality: Right;   TONSILLECTOMY     VAGINAL HYSTERECTOMY     Family History  Problem Relation Age of Onset   Heart attack Father  Father had 2 heart attacks, 1 fatal   Heart disease Sister        Diabetes; dialysis for 14 months, heart attacks   Kidney disease Sister    Diabetes Sister    Heart attack Brother        Juvenile onset diabetes; died at 75 of heart failure   Diabetes Brother    Heart attack Brother    Breast cancer Neg Hx    Social History   Occupational History   Occupation: Retired  Tobacco Use   Smoking status: Never   Smokeless tobacco: Never   Tobacco comments:    smoking cessation materials not required  Vaping Use    Vaping status: Never Used  Substance and Sexual Activity   Alcohol use: No   Drug use: No   Sexual activity: Not Currently    Birth control/protection: Surgical    Comment: TAH-1989   Tobacco Counseling Counseling given: Not Answered Tobacco comments: smoking cessation materials not required  SDOH Screenings   Food Insecurity: No Food Insecurity (11/04/2022)  Housing: Low Risk  (11/04/2022)  Transportation Needs: No Transportation Needs (11/04/2022)  Utilities: Not At Risk (11/04/2022)  Alcohol Screen: Low Risk  (11/04/2022)  Depression (PHQ2-9): Low Risk  (10/12/2023)  Financial Resource Strain: Low Risk  (11/04/2022)  Physical Activity: Insufficiently Active (11/04/2022)  Social Connections: Moderately Integrated (11/04/2022)  Stress: No Stress Concern Present (11/04/2022)  Tobacco Use: Low Risk  (10/31/2023)  Health Literacy: Adequate Health Literacy (11/04/2022)   Depression Screen    10/12/2023    9:25 AM 08/21/2023    2:52 PM 05/15/2023   11:08 AM 11/04/2022   11:13 AM 10/25/2022    9:15 AM 03/29/2022    9:34 AM 09/28/2021   10:58 AM  PHQ 2/9 Scores  PHQ - 2 Score 0 0 0 0 0 0 0  PHQ- 9 Score 0     0  0  0      Data saved with a previous flowsheet row definition     Goals Addressed   None    Fall Screening Falls in the past year?: 0 Number of falls in past year: 0 Was there an injury with Fall?: 0 Fall Risk Category Calculator: 0 Patient Fall Risk Level: Low Fall Risk  Fall Risk Patient at Risk for Falls Due to: No Fall Risks Fall risk Follow up: Falls evaluation completed        Objective:    There were no vitals filed for this visit. There is no height or weight on file to calculate BMI.  Current Medications (verified) Outpatient Encounter Medications as of 11/16/2023  Medication Sig   acetaminophen  (TYLENOL ) 500 MG tablet Take 500 mg by mouth daily as needed.   cholecalciferol (VITAMIN D ) 1000 units tablet Take 1 capsule by mouth once a week.    famotidine  (PEPCID ) 40 MG tablet TAKE 1 TABLET BY MOUTH EVERY DAY   ipratropium (ATROVENT ) 0.06 % nasal spray PLACE 2 SPRAYS INTO BOTH NOSTRILS 4 TIMES DAILY.   irbesartan  (AVAPRO ) 75 MG tablet TAKE 1 TABLET (75 MG TOTAL) BY MOUTH EVERY EVENING.   Multiple Vitamin (MULTIVITAMIN) tablet Take 1 tablet by mouth daily.   pravastatin  (PRAVACHOL ) 80 MG tablet TAKE 1 TABLET BY MOUTH EVERY DAY IN THE MORNING   sertraline  (ZOLOFT ) 50 MG tablet Take 1 tablet (50 mg total) by mouth daily. She is NOT started yet. (Taking 25 mg)   SYNTHROID  75 MCG tablet TAKE 1 TABLET (75 MCG TOTAL) BY MOUTH  DAILY BEFORE BREAKFAST. BRAND NAME ONLY   timolol  (TIMOPTIC ) 0.5 % ophthalmic solution Place 1 drop into both eyes 2 (two) times daily.   Zoledronic Acid (ZOMETA) 4 MG/100ML IVPB Inject 4 mg into the vein every 6 (six) months.   No facility-administered encounter medications on file as of 11/16/2023.   Hearing/Vision screen No results found. Immunizations and Health Maintenance Health Maintenance  Topic Date Due   DTaP/Tdap/Td (2 - Td or Tdap) 03/03/2020   COVID-19 Vaccine (7 - 2025-26 season) 09/11/2023   Medicare Annual Wellness (AWV)  11/04/2023   Zoster Vaccines- Shingrix  (1 of 2) 11/21/2023 (Originally 08/28/1959)   Mammogram  03/30/2024   Pneumococcal Vaccine: 50+ Years  Completed   Influenza Vaccine  Completed   DEXA SCAN  Completed   Meningococcal B Vaccine  Aged Out        Assessment/Plan:  This is a routine wellness examination for [place].  Patient Care Team: Sowles, Krichna, MD as PCP - General (Family Medicine) Perla Evalene PARAS, MD as PCP - Cardiology (Cardiology) Douglas Rule, MD as Consulting Physician (Nephrology) Dasher, Alm LABOR, MD as Consulting Physician (Dermatology) Nada Macintosh, FNP as Nurse Practitioner (Gynecology) Georgina Shasta POUR, RN as Oncology Nurse Navigator Mittie Gaskin, MD as Referring Physician (Ophthalmology) Gerard Frederick, NP as Nurse Practitioner  (Cardiology)  I have personally reviewed and noted the following in the patient's chart:   Medical and social history Use of alcohol, tobacco or illicit drugs  Current medications and supplements including opioid prescriptions. Functional ability and status Nutritional status Physical activity Advanced directives List of other physicians Hospitalizations, surgeries, and ER visits in previous 12 months Vitals Screenings to include cognitive, depression, and falls Referrals and appointments  No orders of the defined types were placed in this encounter.  In addition, I have reviewed and discussed with patient certain preventive protocols, quality metrics, and best practice recommendations. A written personalized care plan for preventive services as well as general preventive health recommendations were provided to patient.   Jhonnie GORMAN Das, LPN   88/03/[id number]   No follow-ups on file.  After Visit Summary: (MyChart) Due to this being a telephonic visit, the after visit summary with patients personalized plan was offered to patient via MyChart   Nurse Notes:none

## 2023-11-16 NOTE — Patient Instructions (Addendum)
 Ms. Pellicane,  Thank you for taking the time for your Medicare Wellness Visit. I appreciate your continued commitment to your health goals. Please review the care plan we discussed, and feel free to reach out if I can assist you further.  Please note that Annual Wellness Visits do not include a physical exam. Some assessments may be limited, especially if the visit was conducted virtually. If needed, we may recommend an in-person follow-up with your provider.  Ongoing Care Seeing your primary care provider every 3 to 6 months helps us  monitor your health and provide consistent, personalized care.   Referrals If a referral was made during today's visit and you haven't received any updates within two weeks, please contact the referred provider directly to check on the status.  Recommended Screenings:  Health Maintenance  Topic Date Due   DTaP/Tdap/Td vaccine (2 - Td or Tdap) 03/03/2020   COVID-19 Vaccine (7 - 2025-26 season) 09/11/2023   Zoster (Shingles) Vaccine (1 of 2) 11/21/2023*   Breast Cancer Screening  03/30/2024   Medicare Annual Wellness Visit  11/15/2024   Pneumococcal Vaccine for age over 51  Completed   Flu Shot  Completed   DEXA scan (bone density measurement)  Completed   Meningitis B Vaccine  Aged Out  *Topic was postponed. The date shown is not the original due date.     Vision: Annual vision screenings are recommended for early detection of glaucoma, cataracts, and diabetic retinopathy. These exams can also reveal signs of chronic conditions such as diabetes and high blood pressure.  Dental: Annual dental screenings help detect early signs of oral cancer, gum disease, and other conditions linked to overall health, including heart disease and diabetes.  Please see the attached documents for additional preventive care recommendations.   NEXT AWV 11/21/24 @ 11:30 AM IN PERSON

## 2023-11-20 DIAGNOSIS — I129 Hypertensive chronic kidney disease with stage 1 through stage 4 chronic kidney disease, or unspecified chronic kidney disease: Secondary | ICD-10-CM | POA: Diagnosis not present

## 2023-11-20 DIAGNOSIS — N2581 Secondary hyperparathyroidism of renal origin: Secondary | ICD-10-CM | POA: Diagnosis not present

## 2023-11-20 DIAGNOSIS — N1831 Chronic kidney disease, stage 3a: Secondary | ICD-10-CM | POA: Diagnosis not present

## 2023-11-23 ENCOUNTER — Ambulatory Visit: Admitting: Family Medicine

## 2023-11-23 ENCOUNTER — Encounter: Payer: Self-pay | Admitting: Family Medicine

## 2023-11-23 VITALS — BP 112/70 | HR 71 | Resp 16 | Ht 64.0 in | Wt 155.1 lb

## 2023-11-23 DIAGNOSIS — N1831 Chronic kidney disease, stage 3a: Secondary | ICD-10-CM | POA: Diagnosis not present

## 2023-11-23 DIAGNOSIS — K529 Noninfective gastroenteritis and colitis, unspecified: Secondary | ICD-10-CM

## 2023-11-23 DIAGNOSIS — E785 Hyperlipidemia, unspecified: Secondary | ICD-10-CM | POA: Diagnosis not present

## 2023-11-23 DIAGNOSIS — F341 Dysthymic disorder: Secondary | ICD-10-CM

## 2023-11-23 DIAGNOSIS — N2581 Secondary hyperparathyroidism of renal origin: Secondary | ICD-10-CM | POA: Diagnosis not present

## 2023-11-23 DIAGNOSIS — R0609 Other forms of dyspnea: Secondary | ICD-10-CM

## 2023-11-23 DIAGNOSIS — I129 Hypertensive chronic kidney disease with stage 1 through stage 4 chronic kidney disease, or unspecified chronic kidney disease: Secondary | ICD-10-CM | POA: Diagnosis not present

## 2023-11-23 NOTE — Progress Notes (Signed)
 Name: Theresa Hall   MRN: [id number]    DOB: 05-11-1940   Date:11/23/2023       Progress Note  Subjective  Chief Complaint  Chief Complaint  Patient presents with   Medical Management of Chronic Issues   Discussed the use of AI scribe software for clinical note transcription with the patient, who gave verbal consent to proceed.  History of Present Illness Theresa Hall is an 83 year old female who presents with chronic diarrhea for a six-week follow-up.  She continues to experience chronic diarrhea with variability in frequency and urgency. This morning, she had an urgent need to defecate before getting out of bed and has had two bowel movements so far. Nausea sometimes accompanies the diarrhea, though she has not vomited. She has experienced cold sweats during bowel movements and describes feeling 'lousy' due to nausea. Previous workup included two negative stool cultures and a negative test for C. difficile. She has not yet taken the Zofran  prescribed for nausea. Diarrhea is slightly better, sometimes only has one to two bowel movements per day.  She experiences shortness of breath with physical activity, such as vacuuming. She has consulted a cardiologist who recommended a cardiac calcium  score and a PET CT cardiac perfusion scan.  She is currently taking Zoloft , which was increased from 25 mg to 50 mg. She notes feeling calmer and sleeping better, though it has not significantly increased her energy levels. She wakes up less frequently at night to use the bathroom. She has not started Wellbutrin due to concerns about potential side effects, including nausea and diarrhea.      Patient Active Problem List   Diagnosis Date Noted   Dysthymia 08/21/2023   History of breast cancer in female 08/21/2023   Invasive carcinoma of breast (HCC) 05/12/2022   Goals of care, counseling/discussion 05/12/2022   Perennial allergic rhinitis with seasonal variation 03/29/2022   GERD without  esophagitis 03/29/2022   Hypertension, benign 03/29/2022   Vitamin D  deficiency 03/29/2022   Benign hypertension with chronic kidney disease, stage III (HCC) 03/29/2022   Fatty liver 03/29/2022   Benign hypertensive kidney disease with chronic kidney disease 07/22/2019   BCC (basal cell carcinoma of skin) 10/16/2017   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   Osteopenia after menopause 08/15/2016   Reflux esophagitis 06/23/2014   Dyslipidemia 06/23/2014   Palpitations 06/23/2014   Chronic kidney disease (CKD), stage III (moderate) (HCC) 08/24/2009   History of diverticulitis 05/14/2008   Adult hypothyroidism 12/04/2006   Migraine without aura and without status migrainosus, not intractable 08/02/2006   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    Past Surgical History:  Procedure Laterality Date   APPENDECTOMY     AXILLARY LYMPH NODE DISSECTION Left 05/27/2022   Procedure: AXILLARY LYMPH NODE DISSECTION w/ SN;  Surgeon: Tye Millet, DO;  Location: [facility];  Service: General;  Laterality: Left;   BASAL CELL CARCINOMA EXCISION     BASAL CELL CARCINOMA EXCISION  2014   nasal   BREAST BIOPSY Left 04/08/2022   us  biopsy 2 areas/ coil clip 4 oc, 4:30 venus clip/ path pending   BREAST BIOPSY Left 04/08/2022   US  LT BREAST BX W LOC DEV 1ST LESION IMG BX SPEC US  GUIDE 04/08/2022 [facility]   BREAST BIOPSY Left 04/08/2022   US  LT BREAST BX W LOC DEV EA ADD LESION IMG BX SPEC US  GUIDE 04/08/2022 [facility]   BREAST BIOPSY  04/27/2022   MM LT RADIO FREQUENCY TAG EA ADD LESION LOC  MAMMO GUIDE 04/27/2022 [facility]   BREAST LUMPECTOMY Left 04/2022   CATARACT EXTRACTION W/PHACO Left 07/03/2019   Procedure: CATARACT EXTRACTION PHACO AND INTRAOCULAR LENS PLACEMENT (IOC) LEFT KAHOOK DUAL BLADE GONIOTOMY 9.84 01:14.5 13.2%;  Surgeon: Mittie Gaskin, MD;  Location[facility];  Service: Ophthalmology;  Laterality: Left;   CATARACT EXTRACTION W/PHACO  Right 08/07/2019   Procedure: CATARACT EXTRACTION PHACO AND INTRAOCULAR LENS PLACEMENT (IOC) RIGHT KAHOOK DUAL BLADE GONIOTOMY;  Surgeon: Mittie Gaskin, MD;  Location: [facility];  Service: Ophthalmology;  Laterality: Right;  6.46 0:59.4 10.9%   CHOLECYSTECTOMY     COLONOSCOPY     ORIF PATELLA Right 08/15/2015   Procedure: OPEN REDUCTION INTERNAL (ORIF) FIXATION PATELLA  and application wound vac;  Surgeon: Norleen JINNY Maltos, MD;  Location: [facility];  Service: Orthopedics;  Laterality: Right;   TONSILLECTOMY     VAGINAL HYSTERECTOMY      Family History  Problem Relation Age of Onset   Heart attack Father        Father had 2 heart attacks, 1 fatal   Heart disease Sister        Diabetes; dialysis for 14 months, heart attacks   Kidney disease Sister    Diabetes Sister    Heart attack Brother        Juvenile onset diabetes; died at 6 of heart failure   Diabetes Brother    Heart attack Brother    Breast cancer Neg Hx     Social History   Tobacco Use   Smoking status: Never   Smokeless tobacco: Never   Tobacco comments:    smoking cessation materials not required  Substance Use Topics   Alcohol use: No     Current Outpatient Medications:    acetaminophen  (TYLENOL ) 500 MG tablet, Take 500 mg by mouth daily as needed., Disp: , Rfl:    cholecalciferol (VITAMIN D ) 1000 units tablet, Take 1 capsule by mouth once a week., Disp: , Rfl:    famotidine  (PEPCID ) 40 MG tablet, TAKE 1 TABLET BY MOUTH EVERY DAY, Disp: 90 tablet, Rfl: 1   ipratropium (ATROVENT ) 0.06 % nasal spray, PLACE 2 SPRAYS INTO BOTH NOSTRILS 4 TIMES DAILY., Disp: 100 mL, Rfl: 1   irbesartan  (AVAPRO ) 75 MG tablet, TAKE 1 TABLET (75 MG TOTAL) BY MOUTH EVERY EVENING., Disp: 30 tablet, Rfl: 0   Multiple Vitamin (MULTIVITAMIN) tablet, Take 1 tablet by mouth daily., Disp: , Rfl:    ondansetron  (ZOFRAN -ODT) 4 MG disintegrating tablet, Take 4 mg by mouth every 8 (eight) hours as needed for nausea., Disp: , Rfl:     pravastatin  (PRAVACHOL ) 80 MG tablet, TAKE 1 TABLET BY MOUTH EVERY DAY IN THE MORNING, Disp: 90 tablet, Rfl: 0   sertraline  (ZOLOFT ) 50 MG tablet, Take 1 tablet (50 mg total) by mouth daily. She is NOT started yet. (Taking 25 mg), Disp: 90 tablet, Rfl: 0   SYNTHROID  75 MCG tablet, TAKE 1 TABLET (75 MCG TOTAL) BY MOUTH DAILY BEFORE BREAKFAST. BRAND NAME ONLY, Disp: 30 tablet, Rfl: 0   timolol  (TIMOPTIC ) 0.5 % ophthalmic solution, Place 1 drop into both eyes 2 (two) times daily., Disp: , Rfl:    Zoledronic Acid (ZOMETA) 4 MG/100ML IVPB, Inject 4 mg into the vein every 6 (six) months., Disp: , Rfl:   Allergies  Allergen Reactions   Codeine Other (See Comments)    Patient had insomnia and nausea   Oxycodone  Other (See Comments)    Made her Loopy after surgery  I personally reviewed active problem list, medication list, allergies, family history with the patient/caregiver today.   ROS  Ten systems reviewed and is negative except as mentioned in HPI    Objective Physical Exam CONSTITUTIONAL: Patient appears well-developed and well-nourished.  No distress. HEENT: Head atraumatic, normocephalic, neck supple. CARDIOVASCULAR: Normal rate, regular rhythm and normal heart sounds.  No murmur heard. No BLE edema. PULMONARY: Effort normal and breath sounds normal. No respiratory distress. ABDOMINAL: There is no tenderness or distention. MUSCULOSKELETAL: Normal gait. Without gross motor or sensory deficit. PSYCHIATRIC: Patient has a normal mood and affect. behavior is normal. Judgment and thought content normal.  Vitals:   11/23/23 1108  BP: 112/70  Pulse: 71  Resp: 16  SpO2: 97%  Weight: 155 lb 1.6 oz (70.4 kg)  Height: 5' 4 (1.626 m)    Body mass index is 26.62 kg/m.  Recent Results (from the past 2160 hours)  ECHOCARDIOGRAM COMPLETE     Status: None   Collection Time: 10/10/23 11:06 AM  Result Value Ref Range   AR max vel 1.90 cm2   AV Peak grad 8.5 mmHg   Ao pk vel 1.46  m/s   S' Lateral 2.77 cm   Area-P 1/2 2.50 cm2   AV Area VTI 1.92 cm2   AV Mean grad 4.0 mmHg   AV Area mean vel 2.05 cm2   Est EF 60 - 65%   B12 and Folate Panel     Status: None   Collection Time: 10/12/23 10:43 AM  Result Value Ref Range   Vitamin B-12 1,073 200 - 1,100 pg/mL   Folate >24.0 ng/mL    Comment:                            Reference Range                            Low:           <3.4                            Borderline:    3.4-5.4                            Normal:        >5.4 .   VITAMIN D  25 Hydroxy (Vit-D Deficiency, Fractures)     Status: None   Collection Time: 10/12/23 10:43 AM  Result Value Ref Range   Vit D, 25-Hydroxy 52 30 - 100 ng/mL    Comment: Vitamin D  Status         25-OH Vitamin D : . Deficiency:                    <20 ng/mL Insufficiency:             20 - 29 ng/mL Optimal:                 > or = 30 ng/mL . For 25-OH Vitamin D  testing on patients on  D2-supplementation and patients for whom quantitation  of D2 and D3 fractions is required, the Quest[facility]ure[facility](TM) 25-OH VIT D, (D2,D3), LC/MS/MS is recommended: order  code 07111 (patients >17yrs). . See Note 1 . Note 1 . For additional information, please refer to  http://education.[facility].com/faq/FAQ199  (This link is being provided for  informational/ educational purposes only.)   Iron, TIBC and Ferritin Panel     Status: None   Collection Time: 10/12/23 10:43 AM  Result Value Ref Range   Iron 81 45 - 160 mcg/dL   TIBC 691 749 - 549 mcg/dL (calc)   %SAT 26 16 - 45 % (calc)   Ferritin 137 16 - 288 ng/mL  TSH     Status: None   Collection Time: 10/12/23 10:43 AM  Result Value Ref Range   TSH 0.64 0.40 - 4.50 mIU/L  Gastrointestinal Pathogen Pnl RT, PCR     Status: None   Collection Time: 10/13/23  3:48 PM  Result Value Ref Range   CampyloBacter Group NOT DETECTED NOT DETECTED   Salmonella species NOT DETECTED NOT DETECTED   Shigella Species NOT DETECTED NOT DETECTED    Vibrio Group NOT DETECTED NOT DETECTED   Yersinia enterocolitica NOT DETECTED NOT DETECTED   Shiga Toxin 1 NOT DETECTED NOT DETECTED   Shiga Toxin 2 NOT DETECTED NOT DETECTED   Norovirus GI/GII NOT DETECTED NOT DETECTED   Rotavirus A NOT DETECTED NOT DETECTED    Comment: Organisms included in the Campylobacter group include C. coli, C. jejuni, and C. lari. Organisms included in the Shigella species include S. dysenteriae, S. boydii, S. sonnei, and S. flexneri. Organisms included in the Vibrio group include V. cholerae and V. parahaemolyticus.   C. difficile GDH and Toxin A/B     Status: None   Collection Time: 11/01/23  3:06 PM  Result Value Ref Range   MICRO NUMBER: [id number]    SPECIMEN QUALITY: Adequate    Source STOOL    STATUS: FINAL    GDH ANTIGEN Not Detected    TOXIN A AND B Not Detected    COMMENT      No toxigenic C. difficile detected For additional information, please refer to http://education.[facility].com/faq/FAQ136 (This link is being provided for informational/educational purposes only.)     PHQ2/9:    11/23/2023   11:06 AM 11/16/2023   12:27 PM 10/12/2023    9:25 AM 08/21/2023    2:52 PM 05/15/2023   11:08 AM  Depression screen PHQ 2/9  Decreased Interest 0 0 0 0 0  Down, Depressed, Hopeless 0 0 0 0 0  PHQ - 2 Score 0 0 0 0 0  Altered sleeping 0 0 0    Tired, decreased energy 0 0 0    Change in appetite 0 0 0    Feeling bad or failure about yourself  0 0 0    Trouble concentrating 0 0 0    Moving slowly or fidgety/restless 0 0 0    Suicidal thoughts 0 0 0    PHQ-9 Score 0 0 0     Difficult doing work/chores Not difficult at all Not difficult at all Not difficult at all       Data saved with a previous flowsheet row definition    phq 9 is negative  Fall Risk:    11/23/2023   11:06 AM 11/16/2023   12:17 PM 10/12/2023    9:25 AM 08/21/2023    2:52 PM 05/15/2023   11:08 AM  Fall Risk   Falls in the past year? 0 0 0 0 0  Number falls in past yr:  0 0 0 0 0  Injury with Fall? 0 0 0 0 0  Risk for fall due to : No Fall Risks No Fall Risks No Fall Risks No Fall Risks No Fall Risks  Follow up Falls evaluation completed Falls evaluation completed;Falls prevention discussed Falls evaluation completed Falls evaluation completed Falls prevention discussed;Education provided;Falls evaluation completed     Assessment & Plan Chronic diarrhea with intermittent nausea Chronic diarrhea with intermittent nausea, negative for C. difficile. Differential includes colitis. Symptoms improved with fewer episodes. - Proceed with endoscopy and colonoscopy next Thursday. - Avoid dairy products as advised by GI specialist. - Use Zofran  as needed for nausea, especially during colonoscopy prep.  Shortness of breath with activity Shortness of breath with activity, unchanged. Cardiologist recommended cardiac calcium  score and PET CT cardiac perfusion scan. Chemical stress test discussed. - Contact cardiologist to schedule cardiac calcium  score and PET CT cardiac perfusion scan. - Follow up on chemical stress test scheduling.  Dysthymia  Managed with Zoloft , increased to 50 mg. Reports feeling calmer and sleeping better. Wellbutrin considered but not started due to potential side effects. - Continue Zoloft  at 50 mg daily. - Keep Wellbutrin on hand if needed in the future.

## 2023-11-30 ENCOUNTER — Encounter: Payer: Self-pay | Admitting: Gastroenterology

## 2023-11-30 ENCOUNTER — Ambulatory Visit: Payer: Self-pay

## 2023-11-30 ENCOUNTER — Ambulatory Visit
Admission: RE | Admit: 2023-11-30 | Discharge: 2023-11-30 | Disposition: A | Discharge status: Home / Self Care | Attending: Gastroenterology | Admitting: Gastroenterology

## 2023-11-30 ENCOUNTER — Encounter
Admission: RE | Disposition: A | Discharge status: Home / Self Care | Payer: Self-pay | Source: Home / Self Care | Attending: Gastroenterology

## 2023-11-30 DIAGNOSIS — K219 Gastro-esophageal reflux disease without esophagitis: Secondary | ICD-10-CM | POA: Diagnosis not present

## 2023-11-30 DIAGNOSIS — K3189 Other diseases of stomach and duodenum: Secondary | ICD-10-CM | POA: Diagnosis not present

## 2023-11-30 DIAGNOSIS — K644 Residual hemorrhoidal skin tags: Secondary | ICD-10-CM | POA: Diagnosis not present

## 2023-11-30 DIAGNOSIS — K449 Diaphragmatic hernia without obstruction or gangrene: Secondary | ICD-10-CM | POA: Diagnosis not present

## 2023-11-30 DIAGNOSIS — I129 Hypertensive chronic kidney disease with stage 1 through stage 4 chronic kidney disease, or unspecified chronic kidney disease: Secondary | ICD-10-CM | POA: Diagnosis not present

## 2023-11-30 DIAGNOSIS — K2950 Unspecified chronic gastritis without bleeding: Secondary | ICD-10-CM | POA: Diagnosis not present

## 2023-11-30 DIAGNOSIS — K648 Other hemorrhoids: Secondary | ICD-10-CM | POA: Diagnosis not present

## 2023-11-30 DIAGNOSIS — K529 Noninfective gastroenteritis and colitis, unspecified: Secondary | ICD-10-CM | POA: Diagnosis not present

## 2023-11-30 DIAGNOSIS — N183 Chronic kidney disease, stage 3 unspecified: Secondary | ICD-10-CM | POA: Diagnosis not present

## 2023-11-30 DIAGNOSIS — K5790 Diverticulosis of intestine, part unspecified, without perforation or abscess without bleeding: Secondary | ICD-10-CM | POA: Diagnosis not present

## 2023-11-30 DIAGNOSIS — K31A13 Gastric intestinal metaplasia without dysplasia, involving the fundus: Secondary | ICD-10-CM | POA: Diagnosis not present

## 2023-11-30 DIAGNOSIS — R197 Diarrhea, unspecified: Secondary | ICD-10-CM | POA: Diagnosis not present

## 2023-11-30 DIAGNOSIS — E039 Hypothyroidism, unspecified: Secondary | ICD-10-CM | POA: Insufficient documentation

## 2023-11-30 DIAGNOSIS — K5730 Diverticulosis of large intestine without perforation or abscess without bleeding: Secondary | ICD-10-CM | POA: Diagnosis not present

## 2023-11-30 HISTORY — PX: ESOPHAGOGASTRODUODENOSCOPY: SHX5428

## 2023-11-30 HISTORY — PX: COLONOSCOPY: SHX5424

## 2023-11-30 SURGERY — COLONOSCOPY
Anesthesia: General

## 2023-11-30 MED ORDER — PROPOFOL 10 MG/ML IV BOLUS
INTRAVENOUS | Status: DC | PRN
  Administered 2023-11-30: 60 mg via INTRAVENOUS

## 2023-11-30 MED ORDER — PROPOFOL 500 MG/50ML IV EMUL
INTRAVENOUS | Status: DC | PRN
  Administered 2023-11-30: 120 ug/kg/min via INTRAVENOUS

## 2023-11-30 MED ORDER — LIDOCAINE HCL (CARDIAC) PF 100 MG/5ML IV SOSY
PREFILLED_SYRINGE | INTRAVENOUS | Status: DC | PRN
  Administered 2023-11-30: 60 mg via INTRAVENOUS

## 2023-11-30 MED ORDER — SODIUM CHLORIDE 0.9 % IV SOLN: INTRAVENOUS | Status: DC

## 2023-11-30 NOTE — Transfer of Care (Signed)
 Immediate Anesthesia Transfer of Care Note  Patient: Theresa Hall  Procedure(s) Performed: COLONOSCOPY EGD (ESOPHAGOGASTRODUODENOSCOPY)  Patient Location: Endoscopy Unit  Anesthesia Type:General  Level of Consciousness: drowsy  Airway & Oxygen Therapy: Patient Spontanous Breathing  Post-op Assessment: Report given to RN  Post vital signs: stable  Last Vitals:  Vitals Value Taken Time  BP    Temp 35.7 C 11/30/23 09:20  Pulse 57 11/30/23 09:20  Resp 12 11/30/23 09:20  SpO2 99 % 11/30/23 09:20    Last Pain:  Vitals:   11/30/23 0920  TempSrc: Temporal  PainSc:          Complications: No notable events documented.

## 2023-11-30 NOTE — H&P (Signed)
 Theresa JONELLE Brooklyn, MD [facility], DHIP [address]  [place], [place] [address]  Main: [phone] Fax:  [phone] Pager: [phone]   Primary Care Physician:  Glenard Mire, MD Primary Gastroenterologist:  Dr. Corinn JONELLE Hall  Pre-Procedure History & Physical: HPI:  Theresa Hall is a 83 y.o. female is here for an endoscopy and colonoscopy.   Past Medical History:  Diagnosis Date   Acute fatty liver of pregnancy in first trimester    Allergic rhinitis, cause unspecified    Allergy    Cancer (HCC) 10/2011   basal 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   Cancer (HCC) 2006   melanoma on shoulder    Chronic kidney disease, stage III (moderate) (HCC)    Diverticulitis    Diverticulitis of colon (without mention of hemorrhage)(562.11)    GERD (gastroesophageal reflux disease)    Glaucoma    Hypertension    Invasive carcinoma of breast (HCC)    Migraine without aura, without mention of intractable migraine without mention of status migrainosus    Osteoarthritis    Osteopenia    Other abnormal glucose    Other and unspecified hyperlipidemia    Palpitations    PONV (postoperative nausea and vomiting)    has used scopolamine  patches   Postablative ovarian failure    Pure hypercholesterolemia    Secondary hyperparathyroidism of renal origin    Unspecified glaucoma(365.9)    Unspecified hypothyroidism    Vertigo    none for over 2 yrs    Past Surgical History:  Procedure Laterality Date   APPENDECTOMY     AXILLARY LYMPH NODE DISSECTION Left 05/27/2022   Procedure: AXILLARY LYMPH NODE DISSECTION w/ SN;  Surgeon: Tye Millet, DO;  Location: [facility];  Service: General;  Laterality: Left;   BASAL CELL CARCINOMA EXCISION     BASAL CELL CARCINOMA EXCISION  2014   nasal   BREAST BIOPSY Left 04/08/2022   us  biopsy 2 areas/ coil clip 4 oc, 4:30 venus clip/ path pending   BREAST BIOPSY Left 04/08/2022   US  LT BREAST BX W LOC DEV 1ST LESION IMG BX SPEC US   GUIDE 04/08/2022 [facility]   BREAST BIOPSY Left 04/08/2022   US  LT BREAST BX W LOC DEV EA ADD LESION IMG BX SPEC US  GUIDE 04/08/2022 [facility]   BREAST BIOPSY  04/27/2022   MM LT RADIO FREQUENCY TAG EA ADD LESION LOC MAMMO GUIDE 04/27/2022 [facility]   BREAST LUMPECTOMY Left 04/2022   CATARACT EXTRACTION W/PHACO Left 07/03/2019   Procedure: CATARACT EXTRACTION PHACO AND INTRAOCULAR LENS PLACEMENT (IOC) LEFT KAHOOK DUAL BLADE GONIOTOMY 9.84 01:14.5 13.2%;  Surgeon: Mittie Gaskin, MD;  Location: [facility];  Service: Ophthalmology;  Laterality: Left;   CATARACT EXTRACTION W/PHACO Right 08/07/2019   Procedure: CATARACT EXTRACTION PHACO AND INTRAOCULAR LENS PLACEMENT (IOC) RIGHT KAHOOK DUAL BLADE GONIOTOMY;  Surgeon: Mittie Gaskin, MD;  Location: [facility];  Service: Ophthalmology;  Laterality: Right;  6.46 0:59.4 10.9%   CHOLECYSTECTOMY     COLONOSCOPY     ORIF PATELLA Right 08/15/2015   Procedure: OPEN REDUCTION INTERNAL (ORIF) FIXATION PATELLA  and application wound vac;  Surgeon: Norleen JINNY Maltos, MD;  Location: [facility];  Service: Orthopedics;  Laterality: Right;   TONSILLECTOMY     VAGINAL HYSTERECTOMY      Prior to Admission medications   Medication Sig Start Date End Date Taking? Authorizing Provider  acetaminophen  (TYLENOL ) 500 MG tablet Take 500 mg by mouth daily as needed.  [provider]  cholecalciferol (VITAMIN D ) 1000 units tablet Take 1 capsule by mouth once a week. 03/29/16   [provider]  famotidine  (PEPCID ) 40 MG tablet TAKE 1 TABLET BY MOUTH EVERY DAY 05/26/23   Glenard, Krichna, MD  ipratropium (ATROVENT ) 0.06 % nasal spray PLACE 2 SPRAYS INTO BOTH NOSTRILS 4 TIMES DAILY. 07/13/22   Sowles, Krichna, MD  irbesartan  (AVAPRO ) 75 MG tablet TAKE 1 TABLET (75 MG TOTAL) BY MOUTH EVERY EVENING. 11/13/23   Sowles, Krichna, MD  Multiple Vitamin (MULTIVITAMIN) tablet Take 1 tablet by mouth daily.    [provider]  pravastatin  (PRAVACHOL ) 80 MG tablet TAKE 1 TABLET BY MOUTH EVERY DAY IN THE MORNING 10/02/23   Sowles, Krichna, MD  sertraline  (ZOLOFT ) 50 MG tablet Take 1 tablet (50 mg total) by mouth daily. She is NOT started yet. (Taking 25 mg) 10/12/23   Sowles, Krichna, MD  SYNTHROID  75 MCG tablet TAKE 1 TABLET (75 MCG TOTAL) BY MOUTH DAILY BEFORE BREAKFAST. BRAND NAME ONLY 11/13/23   Sowles, Krichna, MD  timolol  (TIMOPTIC ) 0.5 % ophthalmic solution Place 1 drop into both eyes 2 (two) times daily. 01/06/20   [provider]  Zoledronic Acid (ZOMETA) 4 MG/100ML IVPB Inject 4 mg into the vein every 6 (six) months. 01/13/23   [provider]    Allergies as of 11/16/2023 - Review Complete 11/16/2023  Allergen Reaction Noted   Codeine Other (See Comments) 06/23/2014   Oxycodone  Other (See Comments) 08/31/2015    Family History  Problem Relation Age of Onset   Heart attack Father        Father had 2 heart attacks, 1 fatal   Heart disease Sister        Diabetes; dialysis for 14 months, heart attacks   Kidney disease Sister    Diabetes Sister    Heart attack Brother        Juvenile onset diabetes; died at 3 of heart failure   Diabetes Brother    Heart attack Brother    Breast cancer Neg Hx     Social History   Socioeconomic History   Marital status: Widowed    Spouse name: Charlena   Number of children: 2   Years of education: some college   Highest education level: 12th grade  Occupational History   Occupation: Retired  Tobacco Use   Smoking status: Never   Smokeless tobacco: Never   Tobacco comments:    smoking cessation materials not required  Vaping Use   Vaping status: Never Used  Substance and Sexual Activity   Alcohol use: No   Drug use: No   Sexual activity: Not Currently    Birth control/protection: Surgical    Comment: TAH-1989  Other Topics Concern   Not on file  Social History Narrative   She has been a widow since 2009. Pt lives alone.    Social  Drivers of Corporate Investment Banker Strain: Low Risk  (11/16/2023)   Received [facility]   Overall Financial Resource Strain (CARDIA)    Difficulty of Paying Living Expenses: Not hard at all  Food Insecurity: No Food Insecurity (11/16/2023)   Hunger Vital Sign    Worried About Running Out of Food in the Last Year: Never true    Ran Out of Food in the Last Year: Never true  Transportation Needs: No Transportation Needs (11/16/2023)   Received from [facility]   [facility] - Transportation  Lack of Transportation (Non-Medical): No    In the past 12 months, has lack of transportation kept you from medical appointments or from getting medications?: No  Physical Activity: Insufficiently Active (11/16/2023)   Exercise Vital Sign    Days of Exercise per Week: 3 days    Minutes of Exercise per Session: 30 min  Stress: No Stress Concern Present (11/16/2023)   [employer] of Occupational Health - Occupational Stress Questionnaire    Feeling of Stress: Only a little  Social Connections: Moderately Integrated (11/16/2023)   Social Connection and Isolation Panel    Frequency of Communication with Friends and Family: More than three times a week    Frequency of Social Gatherings with Friends and Family: Once a week    Attends Religious Services: More than 4 times per year    Active Member of [employer] or Organizations: Yes    Attends Banker Meetings: More than 4 times per year    Marital Status: Widowed  Intimate Partner Violence: Not At Risk (11/16/2023)   Humiliation, Afraid, Rape, and Kick questionnaire    Fear of Current or Ex-Partner: No    Emotionally Abused: No    Physically Abused: No    Sexually Abused: No    Review of Systems: See HPI, otherwise negative ROS  Physical Exam: BP (!) 145/84   Pulse (!) 59   Temp (!) 97 F (36.1 C) (Temporal)   Resp 18   Ht 5' 2 (1.575 m)   Wt 69.3 kg   LMP 01/11/1987 (Approximate)   SpO2  100%   BMI 27.95 kg/m  General:   Alert,  pleasant and cooperative in NAD Head:  Normocephalic and atraumatic. Neck:  Supple; no masses or thyromegaly. Lungs:  Clear throughout to auscultation.    Heart:  Regular rate and rhythm. Abdomen:  Soft, nontender and nondistended. Normal bowel sounds, without guarding, and without rebound.   Neurologic:  Alert and  oriented x4;  grossly normal neurologically.  Impression/Plan: Theresa Hall is here for an endoscopy and colonoscopy to be performed for Chronic diarrhea with abdominal cramps   Risks, benefits, limitations, and alternatives regarding  endoscopy and colonoscopy have been reviewed with the patient.  Questions have been answered.  All parties agreeable.   Theresa Brooklyn, MD  11/30/2023, 8:25 AM

## 2023-11-30 NOTE — Op Note (Signed)
 [facility] Gastroenterology Patient Name: Theresa Hall Procedure Date: 11/30/2023 8:24 AM MRN: [id number] Account #: [id number] Date of Birth: Dec 15, 1940 Admit Type: Outpatient Age: 83 Room: [facility] ENDO ROOM 3 Gender: Female Note Status: Finalized Instrument Name: Endoscope [id number] Procedure:             Upper GI endoscopy Indications:           Diarrhea Providers:             Corinn Jess Brooklyn MD, MD Referring MD:          No Local Md, MD (Referring MD) Medicines:             General Anesthesia Complications:         No immediate complications. Estimated blood loss: None. Procedure:             Pre-Anesthesia Assessment:                        - Prior to the procedure, a History and Physical was                         performed, and patient medications and allergies were                         reviewed. The patient is competent. The risks and                         benefits of the procedure and the sedation options and                         risks were discussed with the patient. All questions                         were answered and informed consent was obtained.                         Patient identification and proposed procedure were                         verified by the physician, the nurse, the                         anesthesiologist, the anesthetist and the technician                         in the pre-procedure area in the procedure room in the                         endoscopy suite. Mental Status Examination: alert and                         oriented. Airway Examination: normal oropharyngeal                         airway and neck mobility. Respiratory Examination:                         clear to auscultation. CV Examination: normal.  Prophylactic Antibiotics: The patient does not require                         prophylactic antibiotics. Prior Anticoagulants: The                         patient has taken no  anticoagulant or antiplatelet                         agents. ASA Grade Assessment: III - A patient with                         severe systemic disease. After reviewing the risks and                         benefits, the patient was deemed in satisfactory                         condition to undergo the procedure. The anesthesia                         plan was to use general anesthesia. Immediately prior                         to administration of medications, the patient was                         re-assessed for adequacy to receive sedatives. The                         heart rate, respiratory rate, oxygen saturations,                         blood pressure, adequacy of pulmonary ventilation, and                         response to care were monitored throughout the                         procedure. The physical status of the patient was                         re-assessed after the procedure.                        After obtaining informed consent, the endoscope was                         passed under direct vision. Throughout the procedure,                         the patient's blood pressure, pulse, and oxygen                         saturations were monitored continuously. The Endoscope                         was introduced through the mouth, and advanced to the  second part of duodenum. The upper GI endoscopy was                         accomplished without difficulty. The patient tolerated                         the procedure well. Findings:      The duodenal bulb and second portion of the duodenum were normal.       Biopsies were taken with a cold forceps for histology.      Localized mucosal changes characterized by congestion, erythema and       inflammation were found in the gastric fundus. Biopsies were taken with       a cold forceps for histology.      Esophagogastric landmarks were identified: the gastroesophageal junction       was found at  35 cm from the incisors.      A small hiatal hernia was present.      The gastroesophageal junction and examined esophagus were normal. Impression:            - Normal duodenal bulb and second portion of the                         duodenum. Biopsied.                        - Congested, erythematous and inflamed mucosa in the                         gastric fundus. Biopsied.                        - Esophagogastric landmarks identified.                        - Small hiatal hernia.                        - Normal gastroesophageal junction and esophagus. Recommendation:        - Await pathology results.                        - Proceed with colonoscopy as scheduled                        See colonoscopy report Procedure Code(s):     --- Professional ---                        [phone], Esophagogastroduodenoscopy, flexible,                         transoral; with biopsy, single or multiple Diagnosis Code(s):     --- Professional ---                        K31.89, Other diseases of stomach and duodenum                        K29.70, Gastritis, unspecified, without bleeding  K44.9, Diaphragmatic hernia without obstruction or                         gangrene                        R19.7, Diarrhea, unspecified CPT copyright 2022 American Medical Association. All rights reserved. The codes documented in this report are preliminary and upon coder review may  be revised to meet current compliance requirements. Dr. Corinn Brooklyn Corinn Jess Brooklyn MD, MD 11/30/2023 9:00:03 AM This report has been signed electronically. Number of Addenda: 0 Note Initiated On: 11/30/2023 8:24 AM Estimated Blood Loss:  Estimated blood loss: none.      [facility]

## 2023-11-30 NOTE — Op Note (Signed)
 [facility] Gastroenterology Patient Name: Theresa Hall Procedure Date: 11/30/2023 8:23 AM MRN: [id number] Account #: [id number] Date of Birth: 1940/02/09 Admit Type: Outpatient Age: 83 Room: [facility] ENDO ROOM 3 Gender: Female Note Status: Finalized Instrument Name: Peds Colonoscope [id number] Procedure:             Colonoscopy Indications:           Last colonoscopy: August 2010, Chronic diarrhea Providers:             Corinn Jess Brooklyn MD, MD Referring MD:          No Local Md, MD (Referring MD) Medicines:             General Anesthesia Complications:         No immediate complications. Estimated blood loss: None. Procedure:             Pre-Anesthesia Assessment:                        - Prior to the procedure, a History and Physical was                         performed, and patient medications and allergies were                         reviewed. The patient is competent. The risks and                         benefits of the procedure and the sedation options and                         risks were discussed with the patient. All questions                         were answered and informed consent was obtained.                         Patient identification and proposed procedure were                         verified by the physician, the nurse, the                         anesthesiologist, the anesthetist and the technician                         in the pre-procedure area in the procedure room in the                         endoscopy suite. Mental Status Examination: alert and                         oriented. Airway Examination: normal oropharyngeal                         airway and neck mobility. Respiratory Examination:                         clear to auscultation. CV Examination: normal.  Prophylactic Antibiotics: The patient does not require                         prophylactic antibiotics. Prior Anticoagulants: The                          patient has taken no anticoagulant or antiplatelet                         agents. ASA Grade Assessment: III - A patient with                         severe systemic disease. After reviewing the risks and                         benefits, the patient was deemed in satisfactory                         condition to undergo the procedure. The anesthesia                         plan was to use general anesthesia. Immediately prior                         to administration of medications, the patient was                         re-assessed for adequacy to receive sedatives. The                         heart rate, respiratory rate, oxygen saturations,                         blood pressure, adequacy of pulmonary ventilation, and                         response to care were monitored throughout the                         procedure. The physical status of the patient was                         re-assessed after the procedure.                        After obtaining informed consent, the colonoscope was                         passed under direct vision. Throughout the procedure,                         the patient's blood pressure, pulse, and oxygen                         saturations were monitored continuously. The                         Colonoscope was introduced through the anus and  advanced to the the terminal ileum, with                         identification of the appendiceal orifice and IC                         valve. The colonoscopy was performed without                         difficulty. The patient tolerated the procedure well.                         The quality of the bowel preparation was good. The                         terminal ileum, ileocecal valve, appendiceal orifice,                         and rectum were photographed. Findings:      The perianal and digital rectal examinations were normal. Pertinent       negatives include normal  sphincter tone and no palpable rectal lesions.      The terminal ileum appeared normal.      Multiple large-mouthed diverticula were found in the left colon.      Normal mucosa was found in the entire colon. Biopsies for histology were       taken with a cold forceps from the entire colon for evaluation of       microscopic colitis.      Non-bleeding external hemorrhoids were found during retroflexion. The       hemorrhoids were medium-sized. Impression:            - The examined portion of the ileum was normal.                        - Severe diverticulosis in the left colon.                        - Normal mucosa in the entire examined colon. Biopsied.                        - Non-bleeding external hemorrhoids. Recommendation:        - Discharge patient to home (with escort).                        - Resume previous diet today.                        - Continue present medications.                        - Await pathology results. Procedure Code(s):     --- Professional ---                        [phone], Colonoscopy, flexible; with biopsy, single or                         multiple Diagnosis Code(s):     --- Professional ---  K64.4, Residual hemorrhoidal skin tags                        K52.9, Noninfective gastroenteritis and colitis,                         unspecified                        K57.30, Diverticulosis of large intestine without                         perforation or abscess without bleeding CPT copyright 2022 American Medical Association. All rights reserved. The codes documented in this report are preliminary and upon coder review may  be revised to meet current compliance requirements. Dr. Corinn Brooklyn Corinn Jess Brooklyn MD, MD 11/30/2023 9:19:00 AM This report has been signed electronically. Number of Addenda: 0 Note Initiated On: 11/30/2023 8:23 AM Scope Withdrawal Time: 0 hours 9 minutes 33 seconds  Total Procedure Duration: 0 hours 14  minutes 55 seconds  Estimated Blood Loss:  Estimated blood loss: none.      [facility]

## 2023-11-30 NOTE — Anesthesia Preprocedure Evaluation (Signed)
 Anesthesia Evaluation  Patient identified by MRN, date of birth, ID band Patient awake    Reviewed: Allergy & Precautions, NPO status , Patient's Chart, lab work & pertinent test results  History of Anesthesia Complications (+) PONV and history of anesthetic complications  Airway Mallampati: III  TM Distance: <3 FB Neck ROM: full    Dental  (+) Chipped   Pulmonary neg pulmonary ROS, neg shortness of breath   Pulmonary exam normal        Cardiovascular Exercise Tolerance: Good hypertension, (-) angina Normal cardiovascular exam     Neuro/Psych  Headaches PSYCHIATRIC DISORDERS         GI/Hepatic Neg liver ROS,GERD  Controlled,,  Endo/Other  Hypothyroidism    Renal/GU Renal disease  negative genitourinary   Musculoskeletal   Abdominal   Peds  Hematology negative hematology ROS (+)   Anesthesia Other Findings Past Medical History: No date: Acute fatty liver of pregnancy in first trimester No date: Allergic rhinitis, cause unspecified No date: Allergy 10/2011: Cancer (HCC)     Comment:  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 No date: Pure hypercholesterolemia No date: Secondary hyperparathyroidism  of renal origin No date: Unspecified glaucoma(365.9) No date: Unspecified hypothyroidism No date: Vertigo     Comment:  none for over 2 yrs  Past Surgical History: No date: APPENDECTOMY 05/27/2022: AXILLARY LYMPH NODE DISSECTION; Left     Comment:  Procedure: AXILLARY LYMPH NODE DISSECTION w/ SN;                Surgeon: Tye Millet, DO;  Location: [facility];  Service:              General;  Laterality: Left; No date: BASAL CELL CARCINOMA EXCISION 2014: BASAL CELL CARCINOMA EXCISION     Comment:  nasal 04/08/2022: BREAST BIOPSY; Left     Comment:  us  biopsy 2 areas/ coil clip 4 oc, 4:30 venus clip/ path              pending 04/08/2022: BREAST BIOPSY; Left     Comment:  US  LT BREAST BX W LOC DEV 1ST LESION IMG BX SPEC US                GUIDE 04/08/2022 [facility] 04/08/2022: BREAST BIOPSY; Left     Comment:  US  LT BREAST BX W LOC DEV EA ADD LESION IMG BX SPEC US                GUIDE 04/08/2022 [facility] 04/27/2022: BREAST BIOPSY     Comment:  MM LT RADIO FREQUENCY TAG EA ADD LESION LOC MAMMO GUIDE               04/27/2022 [facility] 04/2022: BREAST LUMPECTOMY; Left 07/03/2019: CATARACT EXTRACTION W/PHACO; Left     Comment:  Procedure: CATARACT EXTRACTION PHACO AND INTRAOCULAR  LENS PLACEMENT (IOC) LEFT KAHOOK DUAL BLADE GONIOTOMY               9.84 01:14.5 13.2%;  Surgeon: Mittie Gaskin, MD;                Location: [facility];  Service: Ophthalmology;                Laterality: Left; 08/07/2019: CATARACT EXTRACTION W/PHACO; Right     Comment:  Procedure: CATARACT EXTRACTION PHACO AND INTRAOCULAR               LENS PLACEMENT (IOC) RIGHT KAHOOK DUAL BLADE GONIOTOMY;                Surgeon: Mittie Gaskin, MD;  Location: [facility];  Service: Ophthalmology;  Laterality:               Right;  6.46 0:59.4 10.9% No date: CHOLECYSTECTOMY No date: COLONOSCOPY 08/15/2015: ORIF PATELLA; Right     Comment:   Procedure: OPEN REDUCTION INTERNAL (ORIF) FIXATION               PATELLA  and application wound vac;  Surgeon: Norleen JINNY Maltos, MD;  Location: [facility];  Service: Orthopedics;                Laterality: Right; No date: TONSILLECTOMY No date: VAGINAL HYSTERECTOMY  BMI    Body Mass Index: 27.95 kg/m      Reproductive/Obstetrics negative OB ROS                              Anesthesia Physical Anesthesia Plan  ASA: 3  Anesthesia Plan: General   Post-op Pain Management:    Induction: Intravenous  PONV Risk Score and Plan: Propofol  infusion and TIVA  Airway Management Planned: Natural Airway and Nasal Cannula  Additional Equipment:   Intra-op Plan:   Post-operative Plan:   Informed Consent: I have reviewed the patients History and Physical, chart, labs and discussed the procedure including the risks, benefits and alternatives for the proposed anesthesia with the patient or authorized representative who has indicated his/her understanding and acceptance.     Dental Advisory Given  Plan Discussed with: Anesthesiologist, CRNA and Surgeon  Anesthesia Plan Comments: (Patient consented for risks of anesthesia including but not limited to:  - adverse reactions to medications - risk of airway placement if required - damage to eyes, teeth, lips or other oral mucosa - nerve damage due to positioning  - sore throat or hoarseness - Damage to heart, brain, nerves, lungs, other parts of body or loss of life  Patient voiced understanding and assent.)        Anesthesia Quick Evaluation

## 2023-11-30 NOTE — Anesthesia Postprocedure Evaluation (Signed)
 Anesthesia Post Note  Patient: Theresa Hall  Procedure(s) Performed: COLONOSCOPY EGD (ESOPHAGOGASTRODUODENOSCOPY)  Patient location during evaluation: Endoscopy Anesthesia Type: General Level of consciousness: awake and alert Pain management: pain level controlled Vital Signs Assessment: post-procedure vital signs reviewed and stable Respiratory status: spontaneous breathing, nonlabored ventilation and respiratory function stable Cardiovascular status: blood pressure returned to baseline and stable Postop Assessment: no apparent nausea or vomiting Anesthetic complications: no   No notable events documented.   Last Vitals:  Vitals:   11/30/23 0940 11/30/23 0941  BP:  123/60  Pulse:    Resp: 17 12  Temp:    SpO2:      Last Pain:  Vitals:   11/30/23 0920  TempSrc: Temporal  PainSc:                  Fairy POUR [name]

## 2023-12-04 LAB — SURGICAL PATHOLOGY

## 2023-12-10 ENCOUNTER — Other Ambulatory Visit: Payer: Self-pay | Admitting: Family Medicine

## 2023-12-10 DIAGNOSIS — K219 Gastro-esophageal reflux disease without esophagitis: Secondary | ICD-10-CM

## 2023-12-11 ENCOUNTER — Other Ambulatory Visit: Payer: Self-pay | Admitting: Family Medicine

## 2023-12-11 ENCOUNTER — Ambulatory Visit: Payer: Self-pay | Admitting: Gastroenterology

## 2023-12-11 DIAGNOSIS — N1831 Chronic kidney disease, stage 3a: Secondary | ICD-10-CM

## 2023-12-11 DIAGNOSIS — I10 Essential (primary) hypertension: Secondary | ICD-10-CM

## 2023-12-11 DIAGNOSIS — I129 Hypertensive chronic kidney disease with stage 1 through stage 4 chronic kidney disease, or unspecified chronic kidney disease: Secondary | ICD-10-CM

## 2023-12-12 ENCOUNTER — Other Ambulatory Visit: Payer: Self-pay | Admitting: Family Medicine

## 2023-12-12 DIAGNOSIS — E039 Hypothyroidism, unspecified: Secondary | ICD-10-CM

## 2023-12-12 DIAGNOSIS — K529 Noninfective gastroenteritis and colitis, unspecified: Secondary | ICD-10-CM | POA: Diagnosis not present

## 2023-12-13 NOTE — Telephone Encounter (Signed)
 Requested Prescriptions  Pending Prescriptions Disp Refills   famotidine  (PEPCID ) 40 MG tablet [Pharmacy Med Name: FAMOTIDINE  40 MG TABLET] 90 tablet 1    Sig: TAKE 1 TABLET BY MOUTH EVERY DAY     Gastroenterology:  H2 Antagonists Passed - 12/13/2023  6:17 PM      Passed - Valid encounter within last 12 months    Recent Outpatient Visits           2 weeks ago Chronic diarrhea   [facility] Glenard Mire, MD   2 months ago Exertional dyspnea   [facility] Glenard Mire, MD   3 months ago Stage 3a chronic kidney disease [facility])   [facility] Glenard Mire, MD   7 months ago Stage 3a chronic kidney disease [facility])   [facility] Sowles, Krichna, MD       Future Appointments             In 1 month Hammock, Tylene, NP [place] [facility]

## 2023-12-14 NOTE — Telephone Encounter (Signed)
 Requested Prescriptions  Pending Prescriptions Disp Refills   irbesartan  (AVAPRO ) 75 MG tablet [Pharmacy Med Name: IRBESARTAN  75 MG TABLET] 90 tablet 1    Sig: TAKE 1 TABLET (75 MG TOTAL) BY MOUTH EVERY EVENING.     Cardiovascular:  Angiotensin Receptor Blockers Failed - 12/14/2023 11:08 AM      Failed - Cr in normal range and within 180 days    Creat  Date Value Ref Range Status  05/15/2023 1.03 (H) 0.60 - 0.95 mg/dL Final         Failed - K in normal range and within 180 days    Potassium  Date Value Ref Range Status  05/15/2023 4.8 3.5 - 5.3 mmol/L Final         Passed - Patient is not pregnant      Passed - Last BP in normal range    BP Readings from Last 1 Encounters:  11/30/23 123/60         Passed - Valid encounter within last 6 months    Recent Outpatient Visits           3 weeks ago Chronic diarrhea   [facility] Glenard Mire, MD   2 months ago Exertional dyspnea   [facility] Glenard Mire, MD   3 months ago Stage 3a chronic kidney disease [facility])   [facility] Glenard Mire, MD   7 months ago Stage 3a chronic kidney disease [facility])   [facility] Sowles, Krichna, MD       Future Appointments             In 1 month Hammock, Tylene, NP  [facility] at [facility]

## 2023-12-15 ENCOUNTER — Ambulatory Visit: Admitting: Cardiology

## 2023-12-15 NOTE — Telephone Encounter (Signed)
 Requested Prescriptions  Pending Prescriptions Disp Refills   SYNTHROID  75 MCG tablet [Pharmacy Med Name: SYNTHROID  75 MCG TABLET] 90 tablet 3    Sig: TAKE 1 TABLET (75 MCG TOTAL) BY MOUTH DAILY BEFORE BREAKFAST. BRAND NAME ONLY     Endocrinology:  Hypothyroid Agents Passed - 12/15/2023 12:03 PM      Passed - TSH in normal range and within 360 days    TSH  Date Value Ref Range Status  10/12/2023 0.64 0.40 - 4.50 mIU/L Final         Passed - Valid encounter within last 12 months    Recent Outpatient Visits           3 weeks ago Chronic diarrhea   [facility] Glenard Mire, MD   2 months ago Exertional dyspnea   [facility] Glenard Mire, MD   3 months ago Stage 3a chronic kidney disease [facility])   [facility] Glenard Mire, MD   7 months ago Stage 3a chronic kidney disease [facility])   [facility] Sowles, Krichna, MD       Future Appointments             In 1 month Hammock, Tylene, NP [place] [facility]

## 2023-12-27 ENCOUNTER — Encounter (HOSPITAL_COMMUNITY): Payer: Self-pay

## 2023-12-28 ENCOUNTER — Other Ambulatory Visit

## 2023-12-28 ENCOUNTER — Ambulatory Visit
Admission: RE | Admit: 2023-12-28 | Discharge: 2023-12-28 | Disposition: A | Discharge status: Home / Self Care | Source: Ambulatory Visit | Attending: Cardiology | Admitting: Cardiology

## 2023-12-28 DIAGNOSIS — K449 Diaphragmatic hernia without obstruction or gangrene: Secondary | ICD-10-CM | POA: Insufficient documentation

## 2023-12-28 DIAGNOSIS — I2089 Other forms of angina pectoris: Secondary | ICD-10-CM | POA: Diagnosis not present

## 2023-12-28 LAB — NM PET CT CARDIAC PERFUSION MULTI W/ABSOLUTE BLOODFLOW
LV dias vol: 54 mL (ref 46–106)
MBFR: 2.42
Nuc Rest EF: 72 %
Nuc Stress EF: 74 %
Peak HR: 74 {beats}/min
Rest HR: 54 {beats}/min
Rest MBF: 0.6 ml/g/min
Rest Nuclear Isotope Dose: 18.1 mCi
SRS: 12
SSS: 19
ST Depression (mm): 0 mm
Stress MBF: 1.45 ml/g/min
Stress Nuclear Isotope Dose: 17.9 mCi
TID: 1.08

## 2023-12-28 MED ORDER — RUBIDIUM RB82 GENERATOR (RUBYFILL)
25.0000 | PACK | Freq: Once | INTRAVENOUS | Status: AC
  Administered 2023-12-28: 10:00:00 17.93 via INTRAVENOUS

## 2023-12-28 MED ORDER — REGADENOSON 0.4 MG/5ML IV SOLN
INTRAVENOUS | Status: AC
  Filled 2023-12-28: qty 5

## 2023-12-28 MED ORDER — RUBIDIUM RB82 GENERATOR (RUBYFILL)
25.0000 | PACK | Freq: Once | INTRAVENOUS | Status: AC
  Administered 2023-12-28: 10:00:00 18.13 via INTRAVENOUS

## 2023-12-28 MED ORDER — REGADENOSON 0.4 MG/5ML IV SOLN
0.4000 mg | Freq: Once | INTRAVENOUS | Status: AC
  Administered 2023-12-28: 10:00:00 0.4 mg via INTRAVENOUS
  Filled 2023-12-28: qty 5

## 2023-12-28 NOTE — Progress Notes (Signed)
 Stress testing was considered normal low risk, coronary calcium  was present on CT images, myocardial blood flow was considered normal, there was no evidence of ischemia that was noted.  Overall reassuring study.  There was also no significant findings on the chest CT.

## 2024-01-09 ENCOUNTER — Ambulatory Visit: Payer: Self-pay

## 2024-01-09 ENCOUNTER — Ambulatory Visit: Admitting: Family Medicine

## 2024-01-09 NOTE — Telephone Encounter (Signed)
 FYI Only or Action Required?: FYI only for provider: advised home care.  Patient was last seen in primary care on 11/23/2023 by Glenard Mire, MD.  Called Nurse Triage reporting Vomiting and Diarrhea.  Symptoms began several days ago.  Interventions attempted: Rest, hydration, or home remedies.  Symptoms are: gradually improving.  Triage Disposition: Home Care  Patient/caregiver understands and will follow disposition?: Yes                                  1. DIARRHEA SEVERITY: How bad is the diarrhea? How many more stools have you had in the past 24 hours than normal?      Reports recent episodes of diarrhea, last episode was yesterday, states diarrhea has stopped as of today 3. STOOL DESCRIPTION:  How loose or watery is the diarrhea? What is the stool color? Is there any blood or mucous in the stool?     Denies diarrhea today, states she has had 1 x formed BM today 4. VOMITING: Are you also vomiting? If Yes, ask: How many times in the past 24 hours?      Denies at this time 5. ABDOMEN PAIN: Are you having any abdomen pain? If Yes, ask: What does it feel like? (e.g., crampy, dull, intermittent, constant)      Denies at this time 7. ORAL INTAKE: If vomiting, Have you been able to drink liquids? How much liquids have you had in the past 24 hours?     Reports she has been drinking plenty of water  8. HYDRATION: Any signs of dehydration? (e.g., dry mouth [not just dry lips], too weak to stand, dizziness, new weight loss) When did you last urinate?     Denies dizziness, reports being able to stand/walk as normal 9. EXPOSURE: Have you traveled to a foreign country recently? Have you been exposed to anyone with diarrhea? Could you have eaten any food that was spoiled?     Unsure, states she wonders if she ate something that upset her stomach  11. OTHER SYMPTOMS: Do you have any other symptoms? (e.g., fever, blood in stool)      Reports generalized weakness Denies nausea at this time, denies fever at this time, denies blood in stools  Reason for Triage: Patient initially called in to reschedule her upcoming appointment today at 1:40 pm. But as I spoke to the patient she stated she was experiencing symptoms of Vomiting,Nausea, and Diarrhea.  She stated she feels she ate the wrong thing, I just want someone to check in with her - its been going on for a few days now.  ** NT Please Advise **  Reason for Disposition  MILD-MODERATE diarrhea (e.g., 1-6 times / day more than normal)  Protocols used: Diarrhea-A-AH

## 2024-01-16 ENCOUNTER — Encounter: Payer: Self-pay | Admitting: Family Medicine

## 2024-01-16 ENCOUNTER — Ambulatory Visit: Admitting: Family Medicine

## 2024-01-16 VITALS — BP 130/72 | HR 57 | Resp 16 | Ht 62.0 in | Wt 153.8 lb

## 2024-01-16 DIAGNOSIS — Z171 Estrogen receptor negative status [ER-]: Secondary | ICD-10-CM | POA: Diagnosis not present

## 2024-01-16 DIAGNOSIS — K529 Noninfective gastroenteritis and colitis, unspecified: Secondary | ICD-10-CM | POA: Diagnosis not present

## 2024-01-16 DIAGNOSIS — I2584 Coronary atherosclerosis due to calcified coronary lesion: Secondary | ICD-10-CM | POA: Diagnosis not present

## 2024-01-16 DIAGNOSIS — F341 Dysthymic disorder: Secondary | ICD-10-CM

## 2024-01-16 DIAGNOSIS — I129 Hypertensive chronic kidney disease with stage 1 through stage 4 chronic kidney disease, or unspecified chronic kidney disease: Secondary | ICD-10-CM | POA: Diagnosis not present

## 2024-01-16 DIAGNOSIS — E785 Hyperlipidemia, unspecified: Secondary | ICD-10-CM

## 2024-01-16 DIAGNOSIS — I2510 Atherosclerotic heart disease of native coronary artery without angina pectoris: Secondary | ICD-10-CM

## 2024-01-16 DIAGNOSIS — N1831 Chronic kidney disease, stage 3a: Secondary | ICD-10-CM | POA: Diagnosis not present

## 2024-01-16 DIAGNOSIS — C50512 Malignant neoplasm of lower-outer quadrant of left female breast: Secondary | ICD-10-CM

## 2024-01-16 DIAGNOSIS — N183 Chronic kidney disease, stage 3 unspecified: Secondary | ICD-10-CM

## 2024-01-16 DIAGNOSIS — E039 Hypothyroidism, unspecified: Secondary | ICD-10-CM | POA: Diagnosis not present

## 2024-01-16 DIAGNOSIS — K219 Gastro-esophageal reflux disease without esophagitis: Secondary | ICD-10-CM

## 2024-01-16 DIAGNOSIS — K2900 Acute gastritis without bleeding: Secondary | ICD-10-CM | POA: Diagnosis not present

## 2024-01-16 MED ORDER — PRAVASTATIN SODIUM 80 MG PO TABS: 40.0000 mg | ORAL_TABLET | Freq: Every day | ORAL | 1 refills | Status: AC

## 2024-01-16 MED ORDER — IRBESARTAN 75 MG PO TABS: 75.0000 mg | ORAL_TABLET | Freq: Every evening | ORAL | 1 refills | Status: AC

## 2024-01-16 MED ORDER — SERTRALINE HCL 50 MG PO TABS: 50.0000 mg | ORAL_TABLET | Freq: Every day | ORAL | 1 refills | Status: AC

## 2024-01-16 NOTE — Progress Notes (Signed)
 Name: Theresa Hall   MRN: [id number]    DOB: 08-Feb-1940   Date:01/16/2024       Progress Note  Subjective  Chief Complaint  Chief Complaint  Patient presents with   Medical Management of Chronic Issues   Discussed the use of AI scribe software for clinical note transcription with the patient, who gave verbal consent to proceed.  History of Present Illness Theresa Hall is an 84 year old female who presents for follow-up of chronic diarrhea.  She has been experiencing chronic diarrhea . An endoscopy and colonoscopy were performed, revealing a  hiatal hernia, chronic gastritis with intestinal metaplasia, and increased lymphocytes in the colon biopsy. She was prescribed rifaximin 550 mg three times a day but did not take it due to cost concerns. Her bowel movements have improved, with occasional increased frequency but no watery diarrhea.  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 She started Zometa treatment in January 2025 and is scheduled for her next dose in February 2026. She receives a half dosage due to chronic kidney disease stage 3A.  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 She takes Avapro  (irbesartan ) 75 mg for blood pressure and kidney protection.  She is on Zoloft  for feelings of being overwhelmed and reports feeling better physically and emotionally since starting the medication. No side effects from Zoloft  are reported.  She takes Pepcid  for gastroesophageal reflux disease and reports no changes in her symptoms.  She is on pravastatin  for dyslipidemia and reports no issues with this medication. She has a history of coronary artery disease with coronary calcification noted on a recent heart test. She reports occasional chest tightness but no significant chest pain or palpitations. 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   Osteopenia after menopause 08/15/2016   Reflux esophagitis 06/23/2014   Dyslipidemia 06/23/2014   Palpitations 06/23/2014   Chronic kidney disease (CKD), stage III (moderate) (HCC) 08/24/2009   History of diverticulitis 05/14/2008   Adult hypothyroidism 12/04/2006   Migraine without aura and without status migrainosus, not intractable 08/02/2006   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    Past Surgical History:  Procedure Laterality Date   APPENDECTOMY     AXILLARY LYMPH NODE DISSECTION Left 05/27/2022   Procedure: AXILLARY LYMPH NODE DISSECTION w/ SN;  Surgeon: Tye Millet, DO;  Location: [facility];  Service: General;  Laterality: Left;   BASAL CELL CARCINOMA EXCISION     BASAL CELL CARCINOMA EXCISION  2014   nasal   BREAST BIOPSY Left 04/08/2022   us  biopsy 2 areas/ coil clip 4 oc, 4:30 venus clip/ path pending   BREAST BIOPSY Left 04/08/2022   US  LT BREAST BX W LOC DEV 1ST LESION IMG BX SPEC US  GUIDE 04/08/2022 [facility]   BREAST  BIOPSY Left 04/08/2022   US  LT BREAST BX W LOC DEV EA ADD LESION IMG BX SPEC US  GUIDE 04/08/2022 [facility]   BREAST BIOPSY  04/27/2022   MM LT RADIO FREQUENCY TAG EA ADD LESION LOC MAMMO GUIDE 04/27/2022 [facility]   BREAST LUMPECTOMY Left 04/2022   CATARACT EXTRACTION W/PHACO Left 07/03/2019   Procedure: CATARACT EXTRACTION PHACO AND  INTRAOCULAR LENS PLACEMENT (IOC) LEFT KAHOOK DUAL BLADE GONIOTOMY 9.84 01:14.5 13.2%;  Surgeon: Mittie Gaskin, MD;  Location: [facility];  Service: Ophthalmology;  Laterality: Left;   CATARACT EXTRACTION W/PHACO Right 08/07/2019   Procedure: CATARACT EXTRACTION PHACO AND INTRAOCULAR LENS PLACEMENT (IOC) RIGHT KAHOOK DUAL BLADE GONIOTOMY;  Surgeon: Mittie Gaskin, MD;  Location: [facility];  Service: Ophthalmology;  Laterality: Right;  6.46 0:59.4 10.9%   CHOLECYSTECTOMY     COLONOSCOPY     COLONOSCOPY N/A 11/30/2023   Procedure: COLONOSCOPY;  Surgeon: Unk Corinn Skiff, MD;  Location: [facility];  Service: Gastroenterology;  Laterality: N/A;   ESOPHAGOGASTRODUODENOSCOPY N/A 11/30/2023   Procedure: EGD (ESOPHAGOGASTRODUODENOSCOPY);  Surgeon: Unk Corinn Skiff, MD;  Location: [facility];  Service: Gastroenterology;  Laterality: N/A;   ORIF PATELLA Right 08/15/2015   Procedure: OPEN REDUCTION INTERNAL (ORIF) FIXATION PATELLA  and application wound vac;  Surgeon: Norleen JINNY Maltos, MD;  Location: [facility];  Service: Orthopedics;  Laterality: Right;   TONSILLECTOMY     VAGINAL HYSTERECTOMY      Family History  Problem Relation Age of Onset   Heart attack Father        Father had 2 heart attacks, 1 fatal   Heart disease Sister        Diabetes; dialysis for 14 months, heart attacks   Kidney disease Sister    Diabetes Sister    Heart attack Brother        Juvenile onset diabetes; died at 31 of heart failure   Diabetes Brother    Heart attack Brother    Breast cancer Neg Hx     Social History   Tobacco Use   Smoking status: Never   Smokeless tobacco: Never   Tobacco comments:    smoking cessation materials not required  Substance Use Topics   Alcohol use: No    Current Medications[1]  Allergies[2]  I personally reviewed active problem list, medication list, allergies, family history with the patient/caregiver today.   ROS  Ten systems  reviewed and is negative except as mentioned in HPI    Objective Physical Exam  CONSTITUTIONAL: Patient appears well-developed and well-nourished. No distress. HEENT: Head atraumatic, normocephalic, neck supple. CARDIOVASCULAR: Normal rate, regular rhythm and normal heart sounds. No murmur heard. No BLE edema. Soreness on lateral leg, no cords or abnormalities felt. PULMONARY: Effort normal and breath sounds normal. No respiratory distress. ABDOMINAL: There is no tenderness or distention. MUSCULOSKELETAL: Normal gait. Without gross motor or sensory deficit. PSYCHIATRIC: Patient has a normal mood and affect. Behavior is normal. Judgment and thought content normal.  Vitals:   01/16/24 1420  BP: 130/72  Pulse: (!) 57  Resp: 16  SpO2: 95%  Weight: 153 lb 12.8 oz (69.8 kg)  Height: 5' 2 (1.575 m)    Body mass index is 28.13 kg/m.  Recent Results (from the past 2160 hours)  C. difficile GDH and Toxin A/B     Status: None   Collection Time: 11/01/23  3:06 PM  Result Value Ref Range   MICRO NUMBER: [id number]    SPECIMEN QUALITY: Adequate    Source STOOL  STATUS: FINAL    GDH ANTIGEN Not Detected    TOXIN A AND B Not Detected    COMMENT      No toxigenic C. difficile detected For additional information, please refer to http://education.[facility].com/faq/FAQ136 (This link is being provided for informational/educational purposes only.)  Surgical pathology     Status: None   Collection Time: 11/30/23 12:00 AM  Result Value Ref Range   SURGICAL PATHOLOGY      SURGICAL PATHOLOGY [facility] [address] [place], [place] [address] Telephone [phone] or [phone] Fax [phone]  REPORT OF SURGICAL PATHOLOGY   Accession #: [id number] Patient Name: WRIGLEY, WINBORNE Visit # : [id number]  MRN: [id number] Physician: Unk Corinn Ma 1940-12-03 (Age: 40) Gender: F Collected Date: 11/30/2023 Received Date: 11/30/2023  FINAL  DIAGNOSIS       1. Duodenum, Biopsy, cbx :       - DUODENAL MUCOSA WITH NO SPECIFIC HISTOPATHOLOGIC CHANGES      - NEGATIVE FOR INCREASED INTRAEPITHELIAL LYMPHOCYTES OR VILLOUS ARCHITECTURAL      CHANGES       2. Stomach, biopsy, cbx :       - GASTRIC ANTRAL AND OXYNTIC MUCOSA WITH MILD CHRONIC GASTRITIS      - HELICOBACTER PYLORI-LIKE ORGANISMS ARE NOT IDENTIFIED ON ROUTINE H&E STAIN       3. Stomach, biopsy, cbx fundus :       - GASTRIC ANTRAL AND INTESTINAL TYPE MUCOSA WITH CHRONIC INFLAMMATION, SEE NOTE      - NEGATIVE FOR DYSPLASIA       4. Colon,  biopsy, cbx random :       - COLONIC MUCOSA WITH NO SPECIFIC HISTOPATHOLOGIC CHANGES      - NEGATIVE FOR ACUTE INFLAMMATION, INCREASED INTRAEPITHELIAL LYMPHOCYTES OR      THICKENED SUBEPITHELIAL COLLAGEN TABLE       Diagnosis Note : Immunohistochemical stain for synaptophysin shows focal      micronodular ECL cell hyperplasia.The findings appear suggestive but not      diagnostic of atrophic autoimmune gastritis.Clinical correlation is suggested.      ELECTRONIC SIGNATURE : Rebbecca Md, Retail Buyer, Sports Administrator, International Aid/development Worker  MICROSCOPIC DESCRIPTION  CASE COMMENTS STAINS USED IN DIAGNOSIS: H&E H&E H&E Universal Negative Control-DAB H&E H&E H&E H&E-2 H&E-2 Stains used in diagnosis 3 Synaptophysin Some of these immunohistochemical stains may have been developed and the performance characteristics determined by [facility].  Some may not have been cleared or approved by the U.S. Food and Drug Administration.  The FDA has determined that suc h clearance or approval is not necessary.  This test is used for clinical purposes.  It should not be regarded as investigational or for research.  This laboratory is certified under the Clinical Laboratory Improvement Amendments of 1988 (CLIA-88) as qualified to perform high complexity clinical laboratory testing.    CLINICAL HISTORY  SPECIMEN(S) OBTAINED 1.  Duodenum, Biopsy, Cbx 2. Stomach, biopsy, Cbx 3. Stomach, biopsy, Cbx Fundus 4. Colon, biopsy, Cbx Random  SPECIMEN COMMENTS: 2. for diarrhea 3. abnormal mucosa 4. for diarrhea SPECIMEN CLINICAL INFORMATION: 1. Chronic diarrhea.EGD: hiatal hernia.Colon: diverticulosis    Gross Description 1. Received in formalin are tan, soft tissue fragments that are submitted in toto.Number: 4  Size:  0.4 cm, 1 block. 2. Received in formalin are tan, soft tissue fragments that are submitted in toto.Number: 4  Size:  0.4 cm, 1 block. 3. Received in formalin are tan, soft tissue fragments that are submitted  in toto.N umber: 3  Size:  0.4 cm, 1 block. 4. Received in formalin are tan, soft tissue fragments that are submitted in toto.Number: multiple  Size:  0.5 cm, 3 blocks.mb 11-30-23        Report signed out from the following location(s) [place] [address] [place], [place] [address] CLIA #: [id number]  [facility] [address] [place], [place] [address] CLIA #: [id number]   NM PET CT CARDIAC PERFUSION MULTI W/ABSOLUTE BLOODFLOW     Status: None   Collection Time: 12/28/23 10:35 AM  Result Value Ref Range   Rest Nuclear Isotope Dose 18.1 mCi   Stress Nuclear Isotope Dose 17.9 mCi   Rest HR 54.0 bpm   Rest BP 115/95 mmHg   Peak HR 74 bpm   Peak BP 110/46 mmHg   SSS 19.0    SRS 12.0    TID 1.08    Nuc Stress EF 74 %   Nuc Rest EF 72 %   ST Depression (mm) 0 mm   LV dias vol 54.0 46 - 106 mL   Rest MBF 0.60 ml/g/min   Stress MBF 1.45 ml/g/min   MBFR 2.42       PHQ2/9:    01/16/2024    2:20 PM 11/23/2023   11:06 AM 11/16/2023   12:27 PM 10/12/2023    9:25 AM 08/21/2023    2:52 PM  Depression screen PHQ 2/9  Decreased Interest 0 0 0 0 0  Down, Depressed, Hopeless 0 0 0 0 0  PHQ - 2 Score 0 0 0 0 0  Altered sleeping 0 0 0 0   Tired, decreased energy 0 0 0 0   Change in appetite 0 0 0 0   Feeling bad or failure about yourself   0 0 0 0   Trouble concentrating 0 0 0 0   Moving slowly or fidgety/restless 0 0 0 0   Suicidal thoughts 0 0 0 0   PHQ-9 Score 0 0 0 0    Difficult doing work/chores Not difficult at all Not difficult at all Not difficult at all Not difficult at all      Data saved with a previous flowsheet row definition    phq 9 is negative  Fall Risk:    01/16/2024    2:20 PM 11/23/2023   11:06 AM 11/16/2023   12:17 PM 10/12/2023    9:25 AM 08/21/2023    2:52 PM  Fall Risk   Falls in the past year? 0 0 0 0 0  Number falls in past yr: 0 0 0 0 0  Injury with Fall? 0 0  0  0  0   Risk for fall due to : No Fall Risks No Fall Risks No Fall Risks No Fall Risks No Fall Risks  Follow up Falls evaluation completed Falls evaluation completed Falls evaluation completed;Falls prevention discussed Falls evaluation completed Falls evaluation completed     Data saved with a previous flowsheet row definition      Assessment & Plan Chronic diarrhea Intermittent diarrhea with increased frequency. Previous endoscopy and colonoscopy showed chronic gastritis and focal lymphocytosis in the colon. Possible food allergy or alpha-gal syndrome considered. - Ordered alpha-gal and food allergy profile as recommended by GI - Dr. Unk - Checked availability of Xifaxan samples or coupons.  Stage 3a chronic kidney disease with hypertension Chronic kidney disease stage 3a with stable kidney function. Blood pressure well-controlled. - Continue Avapro   for blood pressure management. - Monitor kidney function as needed.  Malignant neoplasm of lower-outer quadrant of left breast, estrogen receptor negative Estrogen receptor-negative breast cancer treated with partial mastectomy . Ongoing Zometa therapy with half dosage due to kidney function concerns. - Continue Zometa therapy as scheduled.  Coronary artery disease due to calcified coronary lesion Coronary artery disease with calcified lesions in the left anterior descending  and left circumflex arteries. Occasional chest tightness noted. - Continue pravastatin  for cholesterol management. - Follow up with cardiology as needed.  Adult hypothyroidism Well-managed on Synthroid . - Continue Synthroid  as prescribed.  Gastroesophageal reflux disease Managed with Pepcid . - Continue Pepcid  as prescribed.  Dysthymia Managed with Zoloft . Reports feeling better physically and emotionally. - Continue Zoloft  as prescribed.  Acute superficial gastritis Chronic gastritis with intestinal metaplasia noted on biopsy. - Continue current management.  Dyslipidemia Managed with pravastatin . - Continue pravastatin  as prescribed.        [1]  Current Outpatient Medications:    acetaminophen  (TYLENOL ) 500 MG tablet, Take 500 mg by mouth daily as needed., Disp: , Rfl:    cholecalciferol (VITAMIN D ) 1000 units tablet, Take 1 capsule by mouth once a week., Disp: , Rfl:    famotidine  (PEPCID ) 40 MG tablet, TAKE 1 TABLET BY MOUTH EVERY DAY, Disp: 90 tablet, Rfl: 1   ipratropium (ATROVENT ) 0.06 % nasal spray, PLACE 2 SPRAYS INTO BOTH NOSTRILS 4 TIMES DAILY., Disp: 100 mL, Rfl: 1   irbesartan  (AVAPRO ) 75 MG tablet, TAKE 1 TABLET (75 MG TOTAL) BY MOUTH EVERY EVENING., Disp: 90 tablet, Rfl: 0   Multiple Vitamin (MULTIVITAMIN) tablet, Take 1 tablet by mouth daily., Disp: , Rfl:    pravastatin  (PRAVACHOL ) 80 MG tablet, TAKE 1 TABLET BY MOUTH EVERY DAY IN THE MORNING, Disp: 90 tablet, Rfl: 0   sertraline  (ZOLOFT ) 50 MG tablet, Take 1 tablet (50 mg total) by mouth daily. She is NOT started yet. (Taking 25 mg), Disp: 90 tablet, Rfl: 0   SYNTHROID  75 MCG tablet, TAKE 1 TABLET (75 MCG TOTAL) BY MOUTH DAILY BEFORE BREAKFAST. BRAND NAME ONLY, Disp: 90 tablet, Rfl: 3   timolol  (TIMOPTIC ) 0.5 % ophthalmic solution, Place 1 drop into both eyes 2 (two) times daily., Disp: , Rfl:    Zoledronic Acid (ZOMETA) 4 MG/100ML IVPB, Inject 4 mg into the vein every 6 (six) months., Disp: , Rfl:  [2]   Allergies Allergen Reactions   Codeine Other (See Comments)    Patient had insomnia and nausea   Oxycodone  Other (See Comments)    Made her Loopy after surgery

## 2024-01-17 ENCOUNTER — Encounter: Payer: Self-pay | Admitting: Cardiology

## 2024-01-17 ENCOUNTER — Ambulatory Visit: Attending: Cardiology | Admitting: Cardiology

## 2024-01-17 VITALS — BP 118/60 | HR 67 | Ht 64.0 in | Wt 153.8 lb

## 2024-01-17 DIAGNOSIS — N1831 Chronic kidney disease, stage 3a: Secondary | ICD-10-CM

## 2024-01-17 DIAGNOSIS — I10 Essential (primary) hypertension: Secondary | ICD-10-CM | POA: Diagnosis not present

## 2024-01-17 DIAGNOSIS — I340 Nonrheumatic mitral (valve) insufficiency: Secondary | ICD-10-CM | POA: Diagnosis not present

## 2024-01-17 DIAGNOSIS — R0602 Shortness of breath: Secondary | ICD-10-CM

## 2024-01-17 DIAGNOSIS — I2510 Atherosclerotic heart disease of native coronary artery without angina pectoris: Secondary | ICD-10-CM | POA: Diagnosis not present

## 2024-01-17 DIAGNOSIS — E785 Hyperlipidemia, unspecified: Secondary | ICD-10-CM | POA: Diagnosis not present

## 2024-01-17 DIAGNOSIS — R5383 Other fatigue: Secondary | ICD-10-CM

## 2024-01-17 MED ORDER — ASPIRIN 81 MG PO TBEC: 81.0000 mg | DELAYED_RELEASE_TABLET | Freq: Every day | ORAL | Status: AC

## 2024-01-17 NOTE — Patient Instructions (Signed)
 Medication Instructions:  Your physician recommends the following medication changes.  START TAKING: Aspirin  81 mg daily  *If you need a refill on your cardiac medications before your next appointment, please call your pharmacy*  Lab Work: No labs ordered today  If you have labs (blood work) drawn today and your tests are completely normal, you will receive your results only by: MyChart Message (if you have MyChart) OR A paper copy in the mail If you have any lab test that is abnormal or we need to change your treatment, we will call you to review the results.  Testing/Procedures: No test ordered today   Follow-Up: At [facility], you and your health needs are our priority.  As part of our continuing mission to provide you with exceptional heart care, our providers are all part of one team.  This team includes your primary Cardiologist (physician) and Advanced Practice Providers or APPs (Physician Assistants and Nurse Practitioners) who all work together to provide you with the care you need, when you need it.  Your next appointment:   3- 4 month(s)  Provider:   You may see Timothy Gollan, MD or one of the following Advanced Practice Providers on your designated Care Team:   Tylene Lunch, NP

## 2024-01-17 NOTE — Progress Notes (Signed)
 " Cardiology Office Note   Date:  01/17/2024  ID:  ANDELYN SPADE, DOB October 24, 1940, MRN [id number] PCP: Glenard Mire, MD  [facility] Providers Cardiologist:  Evalene Lunger, MD Cardiology APP:  Gerard Frederick, NP     History of Present Illness Theresa Hall is a 84 y.o. female with a past medical history of chronic kidney disease stage IIIa, primary pretension, chronic shortness of breath, fatigue, GERD, hyperlipidemia, migraines, osteoarthritis, hypothyroidism, breast cancer, vertigo, who is here today for follow-up.   Previously was evaluated by Dr. Gollan 04/07/2021 after being referred from her primary care provider with complaint of shortness of breath with climbing stairs or walking and decreased fatigue from 2 to 3 months prior.  She reported to have any increased shortness of breath, fatigue with housework.  Concerned about her strong family history of coronary disease.  She reports mild weight gain over the past several years but review of past medical records revealed no significant change in her weight.  Review of CT abdomen pelvis revealed minimal aortic atherosclerosis and no iliac atherosclerosis noted.  She was scheduled for an echocardiogram to evaluate her baseline.  It was recommended she proceed with echo if normal no further workup.  If any worsening symptoms additional studies could be ordered such as a coronary CTA.  There were no medication changes that were made.  Echocardiogram that was completed revealed an LVEF of 60 to 65%, no RWMA, G1 DD, mild to moderate mitral valve regurgitation.    She was last seen in clinic 08/31/2023 with increased fatigue and progressive shortness of breath. States she was feeling fatigued with minimal exertion or minimal activities. States she been complaining about for quite some time with her PCP. She was scheduled for an updated echocardiogram when she had wall motion abnormalities. Explained that she would benefit from a Lexiscan  or  cardiac PET stress versus a coronary CTA due to age and baseline CKD stage III.   She was last seen in clinic 10/31/2023 with fatigue and SOB.  She previously underwent echocardiogram that was unrevealing with normal LVEF.  She continued to follow-up with her PCP with continued complaints.  Initially progressive shortness of breath and fatigue was concerning for anginal equivalents.  She was scheduled for cardiac PET stress.   She returns to clinic today stating overall from a cardiac perspective she has been doing well.  She denies any further chest tightness or chest discomfort, occasionally has shortness of breath on exertion, and has improved and fatigue.  She is continue to follow-up with her PCP.  States that she has been compliant with her current medication regimen without any undesired.  Denies any hospitalizations or visits to the emergency department.  ROS: 10 point review of system has been reviewed and considered negative exception with the listed in the HPI  Studies Reviewed EKG Interpretation Date/Time:  Wednesday January 17 2024 14:27:23 EST Ventricular Rate:  67 PR Interval:  150 QRS Duration:  78 QT Interval:  398 QTC Calculation: 420 R Axis:   2  Text Interpretation: Sinus rhythm with Premature atrial complexes Low voltage QRS When compared with ECG of 31-Oct-2023 10:49, Premature atrial complexes are now Present Confirmed by Gerard Frederick ([id number]) on 01/17/2024 2:29:19 PM    Cardiac PET stress 12/28/2023   Large, moderate, fixed defect in the entire inferior/inferolateral segments. Normal wall motion and LVEF. No TID. Normal myocardial blood flow reserve. Suspect this is related to CT mis-registration artifact given other parameters which are normal.  This is a likely normal study without evience of ischemia or infarction.   LV perfusion is abnormal. There is no evidence of ischemia. There is evidence of infarction. Defect 1: There is a large defect with severe reduction in  uptake present in the apical to basal inferior and inferolateral location(s) that is fixed. There is normal wall motion in the defect area. Consistent with artifact. The defect is consistent with abnormal perfusion in the RCA territory.   End diastolic cavity size is normal.   Myocardial blood flow was computed to be 0.60ml/g/min at rest and 1.45ml/g/min at stress. Global myocardial blood flow reserve was 2.42 and was normal.   Coronary calcium  was present on the attenuation correction CT images. Moderate coronary calcifications were present. Coronary calcifications were present in the left anterior descending artery and left circumflex artery distribution(s).   The study is normal. The study is low risk.  2d echo 10/10/2023 1. Left ventricular ejection fraction, by estimation, is 60 to 65%. The  left ventricle has normal function. Left ventricular endocardial border  not optimally defined to evaluate regional wall motion. Left ventricular  diastolic parameters were normal.  The average left ventricular global longitudinal strain is -16.9 %. The  global longitudinal strain is normal.   2. Right ventricular systolic function is normal. The right ventricular  size is not well visualized. There is normal pulmonary artery systolic  pressure.   3. The mitral valve is degenerative. Mild mitral valve regurgitation. No  evidence of mitral stenosis.   4. Tricuspid valve regurgitation is mild to moderate.   5. The aortic valve is tricuspid. Aortic valve regurgitation is not  visualized. Aortic valve sclerosis/calcification is present, without any  evidence of aortic stenosis.   6. The inferior vena cava is normal in size with greater than 50%  respiratory variability, suggesting right atrial pressure of 3 mmHg.    2D echo 04/23/2021 1. Left ventricular ejection fraction, by estimation, is 60 to 65%. The  left ventricle has normal function. The left ventricle has no regional  wall motion  abnormalities. Left ventricular diastolic parameters are  consistent with Grade I diastolic  dysfunction (impaired relaxation).   2. Right ventricular systolic function is normal. The right ventricular  size is normal. There is normal pulmonary artery systolic pressure. The  estimated right ventricular systolic pressure is 26.3 mmHg.   3. The mitral valve is normal in structure. Mild to moderate mitral valve  regurgitation. No evidence of mitral stenosis.   4. The aortic valve was not well visualized. There is moderate  calcification of the aortic valve. Aortic valve regurgitation is not  visualized. Aortic valve sclerosis/calcification is present, without any  evidence of aortic stenosis.   5. The inferior vena cava is normal in size with greater than 50%  respiratory variability, suggesting right atrial pressure of 3 mmHg.   Risk Assessment/Calculations           Physical Exam VS:  BP 118/60 (BP Location: Right Arm, Patient Position: Sitting, Cuff Size: Normal)   Pulse 67 Comment: 67 oximeter  Ht 5' 4 (1.626 m)   Wt 153 lb 12.8 oz (69.8 kg)   LMP 01/11/1987   SpO2 98%   BMI 26.40 kg/m        Wt Readings from Last 3 Encounters:  01/17/24 153 lb 12.8 oz (69.8 kg)  01/16/24 153 lb 12.8 oz (69.8 kg)  11/30/23 152 lb 12.8 oz (69.3 kg)    GEN: Well nourished, well developed in  no acute distress NECK: No JVD; No carotid bruits CARDIAC: RRR, no murmurs, rubs, gallops RESPIRATORY:  Clear to auscultation without rales, wheezing or rhonchi  ABDOMEN: Soft, non-tender, non-distended EXTREMITIES:  No edema; No deformity   ASSESSMENT AND PLAN Chronic shortness of and fatigue with recent echocardiogram revealing LVEF 60 to 65%.  She was scheduled for coronary PET stress which was considered normal study that was low risk without ischemia noted.  EKG today reveals sinus rhythm with occasional PACs with no acute ischemic changes.  Coronary artery disease with coronary calcification  noted on imaging where she continues to remain chest pain-free.  She has been continued on aspirin  81 mg daily and pravastatin  80 mg daily.  Without any ischemic changes noted on EKG today there is no further ischemic evaluation needed at this time.  Dyslipidemia with an LDL of 95.  Goal with coronary calcification is an LAD of less than 70 she has been continued on his pravastatin  80 mg daily.  Hypertension with a blood pressure today 118/60.  Blood pressures remained stable.  She is continued on irbesartan  75 mg daily.  She has been encouraged to continue to monitor her pressure 1 to 2 hours postmedication administration at home as well.  Chronic kidney disease stage III with last serum creatinine 1.02.  At current baseline.  Encouraged to continue to maintain adequate hydration and to avoid NSAIDs.  Mild to moderate mitral regurgitation noted on echocardiograms as well as tricuspid regurgitation.  Will continue to monitor with surveillance studies.  Valvular changes not considered severe enough to have caused previous shortness of breath complaints.       Dispo: Patient to return to clinic to see MD/APP in 3 to 4 months or sooner if needed for further evaluation.  Signed, Lizania Bouchard, NP   "

## 2024-01-23 ENCOUNTER — Ambulatory Visit: Payer: Self-pay | Admitting: Family Medicine

## 2024-01-23 LAB — FOOD ALLERGY PROFILE

## 2024-01-23 LAB — ALPHA-GAL PANEL: GALACTOSE-ALPHA-1,3-GALACTOSE IGE*: <0.1 kU/L

## 2024-01-23 LAB — INTERPRETATION:

## 2024-01-24 ENCOUNTER — Other Ambulatory Visit: Payer: Self-pay

## 2024-01-24 DIAGNOSIS — K529 Noninfective gastroenteritis and colitis, unspecified: Secondary | ICD-10-CM

## 2024-01-24 DIAGNOSIS — K2900 Acute gastritis without bleeding: Secondary | ICD-10-CM

## 2024-01-29 ENCOUNTER — Ambulatory Visit: Payer: Self-pay | Admitting: Family Medicine

## 2024-01-29 LAB — FOOD ALLERGY PROFILE
"Macadamia Nut ": <0.1 kU/L
"Sesame Seed f10 ": <0.1 kU/L
Allergen, Salmon, f41: <0.1 kU/L
Almonds: <0.1 kU/L
Brazil Nut: <0.1 kU/L
CLASS: 0
CLASS: 0
CLASS: 0
CLASS: 0
CLASS: 0
CLASS: 0
CLASS: 0
CLASS: 0
CLASS: 0
CLASS: 0
CLASS: 0
CLASS: 0
Cashew IgE: <0.1 kU/L
Class: 0
Class: 0
Class: 0
Class: 0
Class: 0
Egg White IgE: <0.1 kU/L
Fish Cod: <0.1 kU/L
Hazelnut: <0.1 kU/L
Milk IgE: <0.1 kU/L
Peanut IgE: <0.1 kU/L
Scallop IgE: <0.1 kU/L
Shrimp IgE: <0.1 kU/L
Soybean IgE: <0.1 kU/L
Tuna IgE: <0.1 kU/L
Walnut: <0.1 kU/L
Wheat IgE: <0.1 kU/L

## 2024-01-29 LAB — INTERPRETATION:

## 2024-01-29 LAB — ALPHA-GAL PANEL
Allergen, Mutton, f88: <0.1 kU/L
Allergen, Pork, f26: <0.1 kU/L
Beef: <0.1 kU/L
CLASS: 0
CLASS: 0
Class: 0
GALACTOSE-ALPHA-1,3-GALACTOSE IGE*: <0.1 kU/L

## 2024-05-13 ENCOUNTER — Ambulatory Visit: Admitting: Cardiology

## 2024-07-15 ENCOUNTER — Ambulatory Visit: Admitting: Family Medicine

## 2024-11-21 ENCOUNTER — Ambulatory Visit
